# Patient Record
Sex: Female | Born: 1935
Health system: Southern US, Community
[De-identification: ages and names within clinical notes are randomized; demographics above are authoritative.]

## PROBLEM LIST (undated history)

## (undated) DIAGNOSIS — E782 Mixed hyperlipidemia: Secondary | ICD-10-CM

## (undated) DIAGNOSIS — I629 Nontraumatic intracranial hemorrhage, unspecified: Secondary | ICD-10-CM

## (undated) DIAGNOSIS — K219 Gastro-esophageal reflux disease without esophagitis: Secondary | ICD-10-CM

## (undated) DIAGNOSIS — K922 Gastrointestinal hemorrhage, unspecified: Secondary | ICD-10-CM

## (undated) DIAGNOSIS — E039 Hypothyroidism, unspecified: Secondary | ICD-10-CM

## (undated) DIAGNOSIS — K222 Esophageal obstruction: Secondary | ICD-10-CM

## (undated) DIAGNOSIS — I509 Heart failure, unspecified: Secondary | ICD-10-CM

## (undated) DIAGNOSIS — K635 Polyp of colon: Secondary | ICD-10-CM

## (undated) DIAGNOSIS — I2510 Atherosclerotic heart disease of native coronary artery without angina pectoris: Secondary | ICD-10-CM

## (undated) DIAGNOSIS — M1990 Unspecified osteoarthritis, unspecified site: Secondary | ICD-10-CM

## (undated) DIAGNOSIS — I10 Essential (primary) hypertension: Secondary | ICD-10-CM

## (undated) DIAGNOSIS — K449 Diaphragmatic hernia without obstruction or gangrene: Secondary | ICD-10-CM

## (undated) DIAGNOSIS — F0390 Unspecified dementia without behavioral disturbance: Secondary | ICD-10-CM

## (undated) DIAGNOSIS — G629 Polyneuropathy, unspecified: Secondary | ICD-10-CM

## (undated) HISTORY — DX: Polyp of colon: K63.5

## (undated) HISTORY — PX: TOTAL ABDOMINAL HYSTERECTOMY: SHX209

## (undated) HISTORY — DX: Esophageal obstruction: K22.2

## (undated) HISTORY — DX: Nontraumatic intracranial hemorrhage, unspecified: I62.9

## (undated) HISTORY — PX: APPENDECTOMY: SHX54

## (undated) HISTORY — DX: Gastrointestinal hemorrhage, unspecified: K92.2

## (undated) HISTORY — DX: Atherosclerotic heart disease of native coronary artery without angina pectoris: I25.10

## (undated) HISTORY — DX: Mixed hyperlipidemia: E78.2

## (undated) HISTORY — PX: COLON SURGERY: SHX602

## (undated) HISTORY — DX: Unspecified osteoarthritis, unspecified site: M19.90

## (undated) HISTORY — PX: TOTAL ABDOMINAL HYSTERECTOMY W/ BILATERAL SALPINGOOPHORECTOMY: SHX83

## (undated) HISTORY — PX: CATARACT EXTRACTION: SUR2

## (undated) HISTORY — DX: Diaphragmatic hernia without obstruction or gangrene: K44.9

## (undated) HISTORY — DX: Hypothyroidism, unspecified: E03.9

---

## 2005-06-15 ENCOUNTER — Ambulatory Visit: Payer: Self-pay | Admitting: Cardiology

## 2005-06-27 ENCOUNTER — Ambulatory Visit: Payer: Self-pay | Admitting: Internal Medicine

## 2005-07-10 ENCOUNTER — Ambulatory Visit: Payer: Self-pay | Admitting: Internal Medicine

## 2005-07-10 ENCOUNTER — Ambulatory Visit (HOSPITAL_COMMUNITY): Admission: RE | Admit: 2005-07-10 | Discharge: 2005-07-10 | Payer: Self-pay | Admitting: Internal Medicine

## 2005-07-23 ENCOUNTER — Ambulatory Visit: Payer: Self-pay | Admitting: Internal Medicine

## 2005-08-06 ENCOUNTER — Inpatient Hospital Stay (HOSPITAL_BASED_OUTPATIENT_CLINIC_OR_DEPARTMENT_OTHER): Admission: RE | Admit: 2005-08-06 | Discharge: 2005-08-06 | Payer: Self-pay | Admitting: Cardiology

## 2005-08-06 ENCOUNTER — Ambulatory Visit: Payer: Self-pay | Admitting: Cardiology

## 2005-08-23 ENCOUNTER — Ambulatory Visit: Payer: Self-pay | Admitting: Cardiology

## 2005-09-04 ENCOUNTER — Ambulatory Visit: Payer: Self-pay | Admitting: Internal Medicine

## 2006-02-20 ENCOUNTER — Ambulatory Visit: Payer: Self-pay | Admitting: Cardiology

## 2006-03-18 ENCOUNTER — Ambulatory Visit: Payer: Self-pay | Admitting: Cardiology

## 2006-06-10 ENCOUNTER — Ambulatory Visit: Payer: Self-pay | Admitting: Cardiology

## 2006-08-23 ENCOUNTER — Ambulatory Visit: Payer: Self-pay | Admitting: Cardiology

## 2007-03-03 ENCOUNTER — Ambulatory Visit: Payer: Self-pay | Admitting: Cardiology

## 2007-03-11 ENCOUNTER — Ambulatory Visit: Payer: Self-pay | Admitting: Cardiology

## 2007-03-25 ENCOUNTER — Ambulatory Visit: Payer: Self-pay | Admitting: Family Medicine

## 2008-04-26 ENCOUNTER — Ambulatory Visit: Payer: Self-pay | Admitting: Cardiology

## 2008-09-17 ENCOUNTER — Encounter: Payer: Self-pay | Admitting: Cardiology

## 2008-10-26 ENCOUNTER — Encounter: Payer: Self-pay | Admitting: Cardiology

## 2008-10-27 ENCOUNTER — Encounter: Payer: Self-pay | Admitting: Cardiology

## 2008-11-08 ENCOUNTER — Encounter: Payer: Self-pay | Admitting: Cardiology

## 2008-12-08 ENCOUNTER — Encounter: Payer: Self-pay | Admitting: Cardiology

## 2008-12-15 ENCOUNTER — Encounter: Payer: Self-pay | Admitting: Cardiology

## 2009-01-06 ENCOUNTER — Ambulatory Visit: Payer: Self-pay | Admitting: Cardiology

## 2009-06-12 DIAGNOSIS — I2510 Atherosclerotic heart disease of native coronary artery without angina pectoris: Secondary | ICD-10-CM | POA: Insufficient documentation

## 2009-06-12 DIAGNOSIS — E782 Mixed hyperlipidemia: Secondary | ICD-10-CM | POA: Insufficient documentation

## 2009-07-17 ENCOUNTER — Encounter: Payer: Self-pay | Admitting: Cardiology

## 2009-08-01 ENCOUNTER — Ambulatory Visit: Payer: Self-pay | Admitting: Cardiology

## 2009-08-01 DIAGNOSIS — I10 Essential (primary) hypertension: Secondary | ICD-10-CM

## 2009-08-03 ENCOUNTER — Encounter (INDEPENDENT_AMBULATORY_CARE_PROVIDER_SITE_OTHER): Payer: Self-pay | Admitting: *Deleted

## 2009-09-28 ENCOUNTER — Encounter: Payer: Self-pay | Admitting: Cardiology

## 2009-09-29 ENCOUNTER — Encounter (INDEPENDENT_AMBULATORY_CARE_PROVIDER_SITE_OTHER): Payer: Self-pay | Admitting: *Deleted

## 2010-02-21 ENCOUNTER — Ambulatory Visit: Payer: Self-pay | Admitting: Cardiology

## 2010-02-24 ENCOUNTER — Encounter: Payer: Self-pay | Admitting: Cardiology

## 2010-06-14 ENCOUNTER — Encounter: Payer: Self-pay | Admitting: Cardiology

## 2010-06-15 ENCOUNTER — Encounter: Payer: Self-pay | Admitting: Cardiology

## 2010-08-07 ENCOUNTER — Ambulatory Visit: Payer: Self-pay | Admitting: Cardiology

## 2010-08-10 ENCOUNTER — Ambulatory Visit (HOSPITAL_COMMUNITY)
Admission: RE | Admit: 2010-08-10 | Discharge: 2010-08-10 | Payer: Self-pay | Source: Home / Self Care | Attending: Ophthalmology | Admitting: Ophthalmology

## 2010-09-26 NOTE — Assessment & Plan Note (Signed)
Summary: 6 MO FU PER JUNE REMINDER-SRS   Visit Type:  Follow-up Primary Provider:  Dr. Doreen Beam   History of Present Illness: 77 year old woman presents for followup. She reports no problems with angina or unusual breathlessness. Medications are reviewed below. She has not had a recent lipid profile.  Blood pressure checks at home have been more reassuring, although her blood pressure today is elevated. We continue to discuss this. Sodium restriction has been recommended.  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  Preventive Screening-Counseling & Management  Alcohol-Tobacco     Smoking Status: never  Current Medications (verified): 1)  Nitrostat 0.4 Mg Subl (Nitroglycerin) .... Place 1 Tablet Under Tongue As Directed 2)  Zantac 150 Mg Tabs (Ranitidine Hcl) .... Take 2 Tablets in Am and 1 in Pm 3)  Iron 325 (65 Fe) Mg Tabs (Ferrous Sulfate) .... Take 1 Tablet By Mouth Daily 4)  Niacin Cr 500 Mg Cr-Caps (Niacin) .... Take 1 Tablet By Mouth Once A Day 5)  Multivitamins  Tabs (Multiple Vitamin) .... Take 1 Tablet By Mouth Once A Day 6)  Atenolol 25 Mg Tabs (Atenolol) .... Take 1 Tablet By Mouth Two Times A Day 7)  Lorazepam 2 Mg Tabs (Lorazepam) .... Take 1 Tablet By Mouth Once A Day At Bedtime 8)  Aspirin 81 Mg Tbec (Aspirin) .... Take 1 Tablet By Mouth Once A Day 9)  Oscal 500/200 D-3 500-200 Mg-Unit Tabs (Calcium-Vitamin D) .... Take 1 Tablet By Mouth Twice A Day 10)  Furosemide 40 Mg Tabs (Furosemide) .... Take 1 Tablet By Mouth Every Other Day As Needed 11)  Synthroid 112 Mcg Tabs (Levothyroxine Sodium) .... Take 1 Tablet By Mouth Once A Day 12)  Potassium Chloride Cr 10 Meq Cr-Tabs (Potassium Chloride) .... Take 1 Tablet By Mouth Once A Day As Needed 13)  Boswellia Serrata Extract  Powd (Boswellia Serrata Extract) .... 400mg  Take 1 Tablet By Mouth Two Times A Day 14)  Coq10 100 Mg Caps (Coenzyme Q10) .... Take 1 Tablet By  Mouth Once A Day 15)  Tylenol Arthritis Pain 650 Mg Cr-Tabs (Acetaminophen) .... Take 1 Tablet By Mouth Two Times A Day 16)  Magnesium 250 Mg Tabs (Magnesium) .... Take 1 Tablet By Mouth Once A Day 17)  Cinnamon 500 Mg Tabs (Cinnamon) .... Take 2 Tablet By Mouth Two Times A Day 18)  Osteo Bi-Flex Adv Triple St  Tabs (Misc Natural Products) .... Take 1 Tablet By Mouth Two Times A Day  Allergies (verified): 1)  ! Valium 2)  ! * Statins  Comments:  Nurse/Medical Assistant: The patient's medication list and allergies were reviewed with the patient and were updated in the Medication and Allergy Lists.  Past History:  Past Medical History: Last updated: 08/01/2009 Gastrointestinal bleed - possibly related to NSAIDs CAD - nonobstructive at catheterization December 2006 Hyperlipidemia Moderate sized sliding hiatal hernia Distal esophageal ring status post dilatation Colonic polyps Arthritis Hypothyroidism  Social History: Last updated: 02/21/2010 Retired  Widowed  Tobacco Use - No Alcohol Use - no   Social History: Retired  Widowed  Tobacco Use - No Alcohol Use - no  Review of Systems  The patient denies anorexia, fever, chest pain, syncope, dyspnea on exertion, peripheral edema, melena, and hematochezia.         Reports chronic problems with neuropathy. No claudication. Otherwise reviewed and negative.  Vital Signs:  Patient profile:   77 year old female Height:  64 inches Weight:      155 pounds BMI:     26.70 Pulse rate:   87 / minute BP sitting:   163 / 78  (left arm) Cuff size:   regular  Vitals Entered By: Carlye Grippe (February 21, 2010 3:17 PM)  Physical Exam  Additional Exam:  Overweight woman in no acute distress. HEENT: Conjunctiva and lids normal, oropharynx with moist mucosa. Neck: Supple, no bruits, no thyromegaly. Lungs: Her to auscultation, diminished, nonlabored. Cardiac: Regular rate and rhythm, soft systolic murmur at the base, no S3  gallop. Abdomen: Soft, nontender, bowel sounds present. Skin: Warm and dry. Extremities: No pitting edema distal pulses 1-2+. Musculoskeletal: No gross deformities. Neuropsychiatric: Alert oriented x3, affect appropriate.   EKG  Procedure date:  02/21/2010  Findings:      Sinus rhythm at 85 beats per minute with nonspecific ST changes.  Impression & Recommendations:  Problem # 1:  CAD, NATIVE VESSEL (ICD-414.01)  Symptomatically stable with prior documentation of nonobstructive disease at catheterization in 2006. Plan to continue medical therapy and observation at this point.  Her updated medication list for this problem includes:    Nitrostat 0.4 Mg Subl (Nitroglycerin) .[name]... Place 1 tablet under tongue as directed    Atenolol 25 Mg Tabs (Atenolol) .[name]... Take 1 tablet by mouth two times a day    Aspirin 81 Mg Tbec (Aspirin) .[name]... Take 1 tablet by mouth once a day  Orders: EKG w/ Interpretation (93000)  Problem # 2:  HYPERLIPIDEMIA-MIXED (ICD-272.4)  Difficult to optimally managed in light of significant statin intolerance. She is using cinnamon supplements as well as niacin and eating Cheerios. Plan to advance niacin 1000 mg daily, and repeat lipid profile and liver function tests.  Her updated medication list for this problem includes:    Niacin Cr 500 Mg Cr-caps (Niacin) .[name]... Take 2 tablets by mouth daily.  Orders: T-Hepatic Function [phone]) T-Lipid Profile [phone])  Problem # 3:  ESSENTIAL HYPERTENSION, BENIGN (ICD-401.1)  Blood pressure elevated today. We continue to discuss this. Sodium restriction recommended. She seems hesitant to any additional medicines at this point.  Her updated medication list for this problem includes:    Atenolol 25 Mg Tabs (Atenolol) .[name]... Take 1 tablet by mouth two times a day    Aspirin 81 Mg Tbec (Aspirin) .[name]... Take 1 tablet by mouth once a day    Furosemide 40 Mg Tabs (Furosemide) .[name]... Take 1 tablet by mouth every other  day as needed  Patient Instructions: 1)  Take Niacin 500mg  2 tablets daily. 2)  Your physician recommends that you go to the [facility] for a FASTING lipid profile and liver function labs. Do not eat or drink after midnight. If the results of your test are normal or stable, you will receive a letter. If they are abnormal, the nurse will contact you by phone.  3)  Your physician wants you to follow-up in: 6 months. You will receive a reminder letter in the mail one-two months in advance. If you don't receive a letter, please call our office to schedule the follow-up appointment.

## 2010-09-26 NOTE — Progress Notes (Signed)
Summary: BLOOD PRESSURE READINGS  Office Visit/ BLOOD PRESSURE READINGS   Imported By: Dorise Hiss 09/29/2009 10:40:20  _____________________________________________________________________  External Attachment:    Type:   Image     Comment:   External Document  Appended Document: Office Visit/ BLOOD PRESSURE READINGS These look better - no new medication at this point.  Appended Document: Office Visit/ BLOOD PRESSURE READINGS Pt notified of results by letter.

## 2010-09-26 NOTE — Letter (Signed)
Summary: Engineer, materials at [facility]  [address]   [place], [place] [address]   Phone: [phone]  Fax: [phone]        September 29, 2009 MRN: [id number]    Lisa Kemp [id number] [facility] [address] [place], [place]  [address]    Dear Lisa Kemp,  Your test ordered by Selena Batten has been reviewed by your physician (or physician assistant) and was found to be normal or stable. Your physician (or physician assistant) felt no changes were needed at this time.  ____ Echocardiogram  ____ Cardiac Stress Test  ____ Lab Work  ____ Peripheral vascular study of arms, legs or neck  ____ CT scan or X-ray  ____ Lung or Breathing test  _X___ Other: Blood pressure readings. Per Dr. Diona Browner: Blood pressures look better, no new medication needed at this point.   Thank you.   Cyril Loosen, RN, BSN    Duane Boston, M.D., F.A.C.C. Thressa Sheller, M.D., F.A.C.C. Oneal Grout, M.D., F.A.C.C. Cheree Ditto, M.D., F.A.C.C. Daiva Nakayama, M.D., F.A.C.C. Kenney Houseman, M.D., F.A.C.C. Jeanne Ivan, PA-C

## 2010-09-28 NOTE — Assessment & Plan Note (Signed)
Summary: 6 MO FUL   Visit Type:  Follow-up Primary Chrislynn Mosely:  Dr. Doreen Beam   History of Present Illness: 77 year old woman presents for followup. She was seen back in June. She has no angina this time and reports no change in her breathing status. She is going to undergo cataract surgery later this week. She continues to work part-time.  Followup labs from July showed AST 16, ALT 14, cholesterol 192, triglycerides 179, HDL 35, LDL 121. She has had a significant statin intolerance. Medical regimen noted below which also includes cinnamon supplements.  Preventive Screening-Counseling & Management  Alcohol-Tobacco     Smoking Status: never  Current Medications (verified): 1)  Nitrostat 0.4 Mg Subl (Nitroglycerin) .... Place 1 Tablet Under Tongue As Directed 2)  Zantac 150 Mg Tabs (Ranitidine Hcl) .... Take 2 Tablets in Am and 1 in Pm 3)  Iron 325 (65 Fe) Mg Tabs (Ferrous Sulfate) .... Take 1 Tablet By Mouth Daily 4)  Niacin Cr 500 Mg Cr-Caps (Niacin) .... Take 1 Tablet By Mouth Two Times A Day 5)  Multivitamins  Tabs (Multiple Vitamin) .... Take 1 Tablet By Mouth Once A Day 6)  Atenolol 25 Mg Tabs (Atenolol) .... Take 1 Tablet By Mouth Two Times A Day 7)  Lorazepam 2 Mg Tabs (Lorazepam) .... Take 1 Tablet By Mouth Once A Day At Bedtime 8)  Aspirin 81 Mg Tbec (Aspirin) .... Take 1 Tablet By Mouth Once A Day 9)  Oscal 500/200 D-3 500-200 Mg-Unit Tabs (Calcium-Vitamin D) .... Take 1 Tablet By Mouth Twice A Day 10)  Furosemide 40 Mg Tabs (Furosemide) .... Take 1 Tablet By Mouth Daily As Needed 11)  Synthroid 112 Mcg Tabs (Levothyroxine Sodium) .... Take 1 Tablet By Mouth Once A Day 12)  Potassium Chloride Cr 10 Meq Cr-Tabs (Potassium Chloride) .... Take 1 Tablet By Mouth Once A Day As Needed 13)  Coq10 200 Mg Caps (Coenzyme Q10) .... Take 1 Tablet By Mouth Once A Day 14)  Magnesium 250 Mg Tabs (Magnesium) .... Take 1 Tablet By Mouth Once A Day 15)  Aleve 220 Mg Tabs (Naproxen Sodium) ....  Take 1 Tablet By Mouth Two Times A Day  Allergies (verified): 1)  ! Valium 2)  ! * Statins  Comments:  Nurse/Medical Assistant: The patient's medication list and allergies were reviewed with the patient and were updated in the Medication and Allergy Lists.  Past History:  Past Medical History: Last updated: 08/01/2009 Gastrointestinal bleed - possibly related to NSAIDs CAD - nonobstructive at catheterization December 2006 Hyperlipidemia Moderate sized sliding hiatal hernia Distal esophageal ring status post dilatation Colonic polyps Arthritis Hypothyroidism  Social History: Last updated: 02/21/2010 Retired  Widowed  Tobacco Use - No Alcohol Use - no  Review of Systems  The patient denies anorexia, fever, chest pain, syncope, peripheral edema, prolonged cough, melena, and hematochezia.         Otherwise reviewed and negative except as outlined.  Vital Signs:  Patient profile:   77 year old female Height:      64 inches Weight:      155 pounds Pulse rate:   84 / minute BP sitting:   133 / 75  (left arm) Cuff size:   regular  Vitals Entered By: Carlye Grippe (August 07, 2010 4:03 PM)  Physical Exam  Additional Exam:  Overweight woman in no acute distress. HEENT: Conjunctiva and lids normal, oropharynx with moist mucosa. Neck: Supple, no bruits, no thyromegaly. Lungs: Her to auscultation, diminished, nonlabored. Cardiac:  Regular rate and rhythm, soft systolic murmur at the base, no S3 gallop. Abdomen: Soft, nontender, bowel sounds present. Skin: Warm and dry. Extremities: No pitting edema distal pulses 1-2+.   Impression & Recommendations:  Problem # 1:  CAD, NATIVE VESSEL (ICD-414.01)  Symptomatically stable, no active angina, with prior history of nonobstructive disease in 2006. Continue medical therapy. Followup in 6 months.  Her updated medication list for this problem includes:    Nitrostat 0.4 Mg Subl (Nitroglycerin) .[name]... Place 1 tablet under  tongue as directed    Atenolol 25 Mg Tabs (Atenolol) .[name]... Take 1 tablet by mouth two times a day    Aspirin 81 Mg Tbec (Aspirin) .[name]... Take 1 tablet by mouth once a day  Problem # 2:  ESSENTIAL HYPERTENSION, BENIGN (ICD-401.1)  Blood pressure up some today. Discussed sodium restriction, continue present medications.  Her updated medication list for this problem includes:    Atenolol 25 Mg Tabs (Atenolol) .[name]... Take 1 tablet by mouth two times a day    Aspirin 81 Mg Tbec (Aspirin) .[name]... Take 1 tablet by mouth once a day    Furosemide 40 Mg Tabs (Furosemide) .[name]... Take 1 tablet by mouth daily as needed  Problem # 3:  HYPERLIPIDEMIA-MIXED (ICD-272.4)  Patient with significant statin intolerance. Continue present course including diet.  Her updated medication list for this problem includes:    Niacin Cr 500 Mg Cr-caps (Niacin) .[name]... Take 1 tablet by mouth two times a day  Patient Instructions: 1)  Your physician wants you to follow-up in: 6 months. You will receive a reminder letter in the mail one-two months in advance. If you don't receive a letter, please call our office to schedule the follow-up appointment. 2)  Your physician recommends that you continue on your current medications as directed. Please refer to the Current Medication list given to you today.

## 2010-09-28 NOTE — Letter (Signed)
Summary: Discharge [facility]  Discharge [facility]   Imported By: Dorise Hiss 08/07/2010 15:58:59  _____________________________________________________________________  External Attachment:    Type:   Image     Comment:   External Document

## 2010-11-07 LAB — BASIC METABOLIC PANEL
BUN: 25 mg/dL — ABNORMAL HIGH (ref 6–23)
CO2: 30 mEq/L (ref 19–32)
Calcium: 8.6 mg/dL (ref 8.4–10.5)
Chloride: 103 mEq/L (ref 96–112)
Creatinine, Ser: 1.28 mg/dL — ABNORMAL HIGH (ref 0.4–1.2)
Glucose, Bld: 99 mg/dL (ref 70–99)

## 2010-11-07 LAB — HEMOGLOBIN AND HEMATOCRIT, BLOOD: HCT: 28.6 % — ABNORMAL LOW (ref 36.0–46.0)

## 2011-01-09 NOTE — Assessment & Plan Note (Signed)
[facility]                          [facility] OFFICE NOTE   Lisa Kemp, FRAME                  MRN:          [id number]  DATE:01/06/2009                            DOB:          1923-12-18    PRIMARY CARE PHYSICIAN:  Doreen Beam, MD   REASON FOR VISIT:  Routine followup.   HISTORY OF PRESENT ILLNESS:  Lisa Kemp comes back in for regular  visit.  Since I last saw her, she was diagnosed with fairly severe  anemia back in March of this year at which time her hemoglobin had  gotten down around 7.  She was referred to Dr. Karilyn Cota and received  packed red cells with subsequent endoscopy and colonoscopy.  She was  noted to have a noncritical distal esophageal ring which was dilated,  moderate-sized sliding hiatal hernia, no significant peptic ulcer  disease, few scattered diverticula in the colon, a small colonic polyp  in the cecum, and it was felt that she may have had GI blood loss  related to chronic nonsteroidal use.  She has been on supplemental iron  and is actually due for followup labs tomorrow.  She states that her  hemoglobin improved significantly and she feels much better.  She denies  having any problems with angina or palpitations.  Lipids from January of  this year showed an LDL of 106 (statin intolerance) and an HDL of 30.   ALLERGIES:  No known drug allergies.   PRESENT MEDICATIONS:  1. Zantac 300 mg p.o. q.a.m. 150 mg p.o. q.p.m.  2. Iron 65 mg p.o. b.i.d.  3. Cinnamon 500 mg p.o. t.i.d.  4. Niacin 500 mg p.o. t.i.d.  5. Multivitamin 1 p.o. q.a.m.  6. Atenolol 25 mg p.o. b.i.d.  7. Lorazepam 2 mg p.o. nightly.  8. Glucosamine 500 mg p.o. t.i.d.  9. Aspirin 81 mg p.o. daily.  10.Os-Cal.  11.Vitamin D 500 mg p.o. daily.  12.Lasix 20-40 mg p.o. p.r.n.  13.Levoxyl 112 mcg p.o. daily.  14.Klor-Con 10 mEq p.o. daily p.r.n. with Lasix.  15.Sublingual nitroglycerin 0.4 mg p.r.n.   REVIEW OF SYSTEMS:  As outlined  above.  Otherwise, reviewed negative.   PHYSICAL EXAMINATION:  VITAL SIGNS:  Blood pressure is 138/72, heart  rate is 78, weight is 160 pounds which is up 6 pounds from her last  visit.  GENERAL:  The patient is comfortable in no acute distress.  HEENT:  Conjunctiva is normal.  Oropharynx clear.  NECK:  Supple.  LUNGS:  Clear.  Nonlabored breathing.  CARDIAC:  Regular rate and rhythm.  No S3 gallop or pericardial rub.  EXTREMITIES:  Exhibit no significant pitting edema.   IMPRESSION AND RECOMMENDATIONS:  1. Nonobstructive coronary artery disease with normal ejection      fraction based on previous evaluation.  [name] is not      describing any problems with angina, since her last visit.  Her      medical therapy is reasonable and she is not having to use any      sublingual nitroglycerin of late.  We talked about a basic walking  regimen, and I will plan to see her back for symptom observation      over the next 6 months.  2. Hyperlipidemia with statin intolerance.  She is taking omega-3      supplements and niacin as well as cinnamon supplements.  Her LDL      was around 100.  3. Apparent gastrointestinal bleed related to nonsteroidal anti-      inflammatory drug use.  The patient is being followed by Dr.      Karilyn Cota.  She had a colonoscopy and endoscopy in March of this year.      She is due for followup labs soon and states that she feels much      better after packed red cell transfusion.     Jonelle Sidle, MD  Electronically Signed    SGM/MedQ  DD: 01/06/2009  DT: 01/07/2009  Job #: [id number]   cc:   Doreen Beam, MD

## 2011-01-09 NOTE — Assessment & Plan Note (Signed)
[facility]                          [facility]ICE NOTE   ATHIRA, JANOWICZ                  MRN:          [id number]  DATE:03/03/2007                            DOB:          07-Jun-1924    PRIMARY CARE PHYSICIAN:  Dr. Doreen Beam.   REASON FOR VISIT:  Coronary artery disease.   HISTORY OF PRESENT ILLNESS:  Ms. Manrique was in the office back in  December.  She continues to do relatively well with occasional chest  tightness, although nothing progressive.  She has known coronary  disease with a 70% distal right coronary artery stenosis that has been  managed medically.  She is tolerating her medicines well, although I  note that she was taken off of simvastatin, given problems with  myalgias.  She was placed on pravastatin by Shelby Dubin and is due to  have follow-up lipids and liver function tests in August.   ALLERGIES:  No known drug allergies.   CURRENT MEDICATIONS:  1. Nexium 40 mg p.o. daily.  2. Klor-Con 10 mEq p.o. daily.  3. Levoxyl 112 mcg p.o. daily.  4. Lasix 20 mg p.o. daily, occasionally 40 mg every third day.  5. Aspirin 81 mg p.o. daily.  6. Glucosamine 500 mg p.o. t.i.d.  7. CoQ 10 daily.  8. Zantac 150 mg p.o. daily.  9. Lorazepam 2 mg p.o. nightly.  10.Pravastatin 40 mg p.o. nightly.  11.Atenolol 25 mg p.o. b.i.d.   REVIEW OF SYSTEMS:  Per history of present illness, otherwise negative.   PHYSICAL EXAMINATION:  VITAL SIGNS:  Blood pressure 110/64, heart rate  80.  Weight is 162 pounds.  GENERAL:  Patient is comfortable in no acute distress.  NECK:  No elevated jugular venous distention, no loud bruits.  LUNGS:  Generally clear without labored breathing.  CARDIAC:  Regular rate and rhythm.  No loud murmur or gallop.  EXTREMITIES:  No significant pitting edema.   A 12-lead electrocardiogram today shows a sinus rhythm with nonspecific  ST-T wave changes.   IMPRESSION/RECOMMENDATIONS:  1. Coronary  artery disease with a 70% distal right coronary stenosis      and otherwise nonobstructive disease and normal ejection fraction,      being managed medically.  Intermittent angina is stable.  Will plan      to continue present medications.  I gave her a refill for atenolol.      She is also due to have follow-up liver functions and lipids in      August, having been switched from simvastatin to pravastatin.  Her      previous myalgias have resolved.  2. Further followup in the next six months.     Jonelle Sidle, MD  Electronically Signed    SGM/MedQ  DD: 03/03/2007  DT: 03/04/2007  Job #: [id number]   cc:   Doreen Beam

## 2011-01-09 NOTE — Assessment & Plan Note (Signed)
[facility]                          [facility] OFFICE NOTE   AISLEY, WHAN                  MRN:          [id number]  DATE:04/26/2008                            DOB:          Nov 16, 1923    PRIMARY CARE PHYSICIAN:  Doreen Beam, MD   REASON FOR VISIT:  Scheduled followup.   HISTORY OF PRESENT ILLNESS:  Ms. Wallman was last seen in the office  approximately 1 year ago.  She has a history of previously documented  nonobstructive coronary atherosclerosis with the exception of a 70%  stenosis just prior to the takeoff of the posterior descending branch.  Followup noninvasive testing demonstrates overall low-risk findings in  terms of perfusion defects and she has been relatively stable with  occasional anginal symptoms.  Ejection fraction is 61%.  She has had  trouble with statin intolerances including now Vytorin, Zocor, and  Pravachol.  She developed myalgias with all of these preparations and is  now only taking niacin daily.  Fortunately, her last lipid profile in  February of this year looked overall fairly good with an LDL of 85 and  normal liver function tests.  She reports no problems of significant  palpitations.  She seems to have better control of her symptoms on  atenolol twice daily instead of once daily.  Her electrocardiogram today  is relatively stable showing sinus rhythm with decreased R-wave  progression and nonspecific ST-T wave changes.   ALLERGIES:  No known drug allergies.   PRESENT MEDICATIONS:  1. Niacin 500 mg p.o. daily.  2. Klor-Con 10 mEq p.o. daily.  3. Levoxyl 112 mcg p.o. daily.  4. Lasix 20 mg p.o. daily.  5. Aspirin 81 mg p.o. daily.  6. Glucosamine 500 mg p.o. t.i.d.  7. Zantac 150 mg p.o. nightly p.r.n.  8. CoQ10 daily.  9. Lorazepam as directed.  10.Atenolol 25 mg presently daily, although was typically been b.i.d.   REVIEW OF SYSTEMS:  As per history of present illness.  Otherwise,  negative.   PHYSICAL EXAMINATION:  VITAL SIGNS:  Blood pressure 139/80, heart rate  is 90, and weight is 154 pounds.  GENERAL:  This is a pleasant lively elderly woman without acute  complaints.  HEENT:  Conjunctiva is normal.  Oropharynx is clear.  NECK:  Supple.  No elevated jugular venous pressure.  No audible bruits.  No thyromegaly is noted.  LUNGS:  Clear without labored breathing at rest.  Diminished breath  sounds noted.  CARDIAC:  Regular rate and rhythm.  No S3, gallop, or pericardial rub.  ABDOMEN:  Soft and nontender.  EXTREMITIES:  Exhibit no frank pitting edema.  SKIN:  Warm and dry.  MUSCULOSKELETAL:  No kyphosis noted.  NEUROPSYCHIATRIC:  The patient is alert and oriented x3.  Affect is  normal.   IMPRESSION AND RECOMMENDATIONS:  1. Nonobstructive coronary atherosclerosis with normal ejection      fraction.  Ms. Andy has occasional chest pain, which is likely      anginal, although it has not been progressive.  I will plan to have      her increase her atenolol back  to 25 mg p.o. b.i.d., and otherwise,      continue her remaining medications.  She is not on any long-acting      nitrates, which could be considered ultimately if needed.  I will      plan to see her back over the next 6 months.  2. Hyperlipidemia.  She is presently on niacin alone with a general      intolerance to statins.  Last lipid profile was in February.  We      will plan to repeat this.     Jonelle Sidle, MD  Electronically Signed    SGM/MedQ  DD: 04/26/2008  DT: 04/27/2008  Job #: [id number]
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
## 2011-01-12 NOTE — Cardiovascular Report (Signed)
NAMEKENDALLYN, LIPPOLD NO.:  [id number]   MEDICAL RECORD NO.:  [id number]          PATIENT TYPE:  OIB   LOCATION:  1963                         FACILITY:  [facility]   PHYSICIAN:  Rollene Rotunda, M.D.   DATE OF BIRTH:  June 09, 1924   DATE OF PROCEDURE:  08/06/2005  DATE OF DISCHARGE:  08/06/2005                              CARDIAC CATHETERIZATION   PRIMARY CARE PHYSICIAN:  Wende Crease, MD.   CARDIOLOGIST:  Learta Codding, MD, [facility].   PROCEDURE:  Left heart catheterization and selective coronary arteriography.   INDICATION:  Evaluate patient with chest pain and a Cardiolite demonstrating  a fixed possible inferior wall defect.  There was possible transient global  dilatation.  Overall, the ejection fraction was 70%.   PROCEDURAL NOTE:  Left heart catheterization was performed via the right  femoral artery.  The artery was cannulated using an anterior wall puncture.  A #4-French sheath was inserted via the modified Seldinger technique.  Ultimately, this had to be switched to #5-French sheaths to find the right  catheter to cannulate the left main.  She needed a JL-5 [place] catheter to  cannulate the left main.  The patient tolerated the procedure well and left  the lab in stable condition.   RESULTS:   HEMODYNAMICS:  LV 159/11, LV 159/71.   CORONARIES:  The left main had a distal, calcified plaque with no luminal  obstruction.  The LAD had a long, proximal, 25% stenosis with calcification.  The first diagonal was small and normal.  The second diagonal was moderate,  bifurcating, and normal.  The circumflex was a large vessel in the AV  groove.  There were luminal irregularities.  The first obtuse marginal was  large and normal.  The second obtuse marginal was large and normal.  The  right coronary artery was a large, dominant vessel.  There was 70% stenosis  before the PDA.  The PDA was moderate sized and normal.  The posterolateral  was small and normal.  The  right coronary artery had scattered  calcification.   LEFT VENTRICULOGRAM:  The left ventriculogram was obtained in the RAO  projection.  The EF was 65% with normal wall motion.   CONCLUSION:  Right coronary artery stenosis.  This does not appear to be  obstructive by the results of the Cardiolite, which demonstrated no  reversible ischemia in the inferior wall.  The patient's symptoms are  atypical.  There are no high-grade lesions in the left system to explain the transient  dilatation in the LV on the Cardiolite.  Given all of this, the patient will  be managed medically with aggressive risk reduction.  Consideration can be  given to PCI of the right coronary artery if she has ischemic symptoms in  the future.           ______________________________  Rollene Rotunda, M.D.     JH/MEDQ  D:  08/06/2005  T:  08/06/2005  Job:  [id number]   cc:   Wende Crease, M.D.

## 2011-01-12 NOTE — Assessment & Plan Note (Signed)
[facility]                          [facility]ICE NOTE   MAKENZYE, TROUTMAN                  MRN:          [id number]  DATE:08/23/2006                            DOB:          March 17, 1924    REASON FOR OFFICE VISIT:  Six month followup.   [name] presents for scheduled clinic followup.  Since last seen  here in June by Dr. Simona Huh, she reports no significant change from  her baseline level of exercise tolerance, and continues to have some  occasional, mild anterior chest tightness when walking up a hill.  However, she points out that this is a long standing symptom with no  recent exacerbation.  She denies any symptoms at rest.   Patient continues to remain quite active and works every other week.  However, she does tell me that her income is quite limited, and that  this puts constraints on her ability to afford medications.   CURRENT MEDICATIONS:  1. Atenolol 25 daily.  2. Nexium.  3. Levoxyl 0.112 mg daily.  4. Lasix 20 mg p.r.n.  5. Aspirin 81 daily.  6. Glucosamine 500 t.i.d.  7. Zantac 150 nightly.  8. Lorazepam 2 mg nightly.  9. Vytorin 10/40 one-half tablet nightly.   PHYSICAL EXAMINATION:  Blood pressure 126/66, pulse 94 and regular,  weight 157.  GENERAL:  A 77 year old female, sitting upright, in no distress.  NECK:  Palpable bilateral carotid pulse without bruits.  LUNGS:  Clear to auscultation in all fields.  HEART:  Regular rate and rhythm (S1, S2).  No significant murmurs.  EXTREMITIES:  Palpable with distal pulses without edema.  NEURO:  No focal deficit.   REVIEW OF SYSTEMS:  Mild exertional dyspnea but no orthopnea, PND or  lower extremity edema.  Remaining systems negative.   IMPRESSION:  1. Single vessel coronary artery disease      a.     A 70% distal RCA stenosis, treated medically.      b.     Normal left ventricular function.  2. Hyperlipidemia, excellent control with most recent LDL  level 55 on      Vytorin.  3. History of hypertension, well-controlled.  4. Hypothyroidism.  5. Chronic mild lower extremity edema.  6. History of esophageal stricture, status post dilatation, November      2006.   PLAN:  1. Substitute Vytorin with simvastatin 40 mg nightly secondary to      significant financial constraints.  We will need a followup fasting      lipids/liver profile in 12 weeks.  2. Increase atenolol to 25 mg b.i.d. to try to ameliorate her      occasional breakthrough angina with exertion.  We considered adding      Imdur, but given its increased costs, we prefer to try to adjust      her beta blocker dose at this time.  3. Return clinic, follow up with Dr. Simona Huh in 6 months for      continued close monitoring and management.      Rozell Searing, PA-C  Electronically Signed      Remi Deter  Franky Macho, MD  Electronically Signed   GS/MedQ  DD: 08/23/2006  DT: 08/23/2006  Job #: [id number]   cc:   Doreen Beam

## 2011-01-12 NOTE — Assessment & Plan Note (Signed)
[facility]                          [facility]ICE NOTE   RICKETTA, COLANTONIO                    MRN:          [id number]  DATE:02/14/2007                            DOB:          1923/10/03    Telephone documentation note   Ms. Kram was called today regarding her difficultly in tolerating  her antihyperlipidemic therapy. In a telephone conversation today she  relates that several weeks ago she discontinued her simvastatin 40 mg  daily as prescribed by Gene Serpe, PA due to significant muscle ache  myalgia which limited her daily activities. The patient discontinued  that therapy at the time of her calling to the office on or around June  4th. She states that she is feeling better now. She continues on her  coenzyme Q10. She has no known drug allergies. She states she has  returned almost to her baseline with some underlying arthritis pain but  nothing other that is focal. Patient states that she is willing to try  other therapies to control her hypercholesterolemia. In review of the  patient's chart and her risk factors, I have recommended that she try  pravastatin 40 mg 1 tablet daily in the evening. She is willing to try  this. I have discussed with her the possibility that this therapy may  not cause as much muscle ache due to its water solubility versus muscle  solubility though this point may not affect clinical endpoints.  The  patient states she will continue taking her coenzyme Q10. She will  monitor for muscle aches and pains that have changed from her baseline.  She requests that the prescription be called to [facility] in [place]. I will  do that today with a quantity of 30 tablets with 1 refill. At the  patient's request I have mailed a prescription to her for a 90 day  supply of pravastatin 40 mg and completed paperwork for her to have labs  completed at the [facility] on or around the first of August to  assess her  progress towards goal. The patient has our contact numbers  with questions or problems in the meantime. She verbalizes understanding  of this plan and will follow up accordingly as indicated.      Shelby Dubin, PharmD, BCPS, CPP  Electronically Signed      Luis Abed, MD, [facility]  Electronically Signed   MP/MedQ  DD: 02/14/2007  DT: 02/14/2007  Job #: [id number]   cc:   Jonelle Sidle, MD
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
## 2011-01-12 NOTE — Op Note (Signed)
[name], [name]           ACCOUNT NO.:  [id number]   MEDICAL RECORD NO.:  [id number]          PATIENT TYPE:  AMB   LOCATION:  DAY                           FACILITY:  [facility]   PHYSICIAN:  Lionel December, M.D.    DATE OF BIRTH:  1924-04-01   DATE OF PROCEDURE:  07/10/2005  DATE OF DISCHARGE:                                 OPERATIVE REPORT   PROCEDURE:  Esophagogastroduodenoscopy with esophageal dilation.   INDICATIONS:  Lisa Kemp is a 77 year old Caucasian female with two-year  history of heartburn and regurgitation who is also experiencing dysphagia.  She failed H2B and initially was not doing well on Nexium. However, she is  taking the medicine as directed and feels better as far as her heartburn is  concerned but not 100%. She is undergoing diagnostic/therapeutic EGD.  Procedure risks were reviewed with the patient, and informed consent was  obtained.   PREMEDICATION:  Cetacaine spray pharyngeal topical anesthesia, Demerol 50 mg  IV, Versed 7 mg IV in divided dose.   FINDINGS:  Procedure performed in endoscopy suite. The patient's vital signs  and O2 saturation were monitored during the procedure and remained stable.  The patient was placed in left lateral position. Olympus videoscope was  passed via oropharynx without any difficulty into esophagus.   Esophagus. Mucosa of the esophagus was normal, but body was tortuous. Ring  was noted at GE junction which was at 33 cm from the incisors. Hiatus was at  37 and was wide open. Small to moderate size hiatal hernia was noted.   Stomach. It was empty and distended very well insufflation. Folds of  proximal stomach were normal. Examination of mucosa at body, antrum, pyloric  channel as well as angularis, fundus and cardia was normal. Hernia was  easily seen on the view.   Duodenum. Bulbar mucosa was normal. Scope was passed into the second part of  the duodenum where mucosa and folds were normal. Endoscope was withdrawn.   Esophagus was dilated by passing 54-French Maloney dilator through esophagus  completely. As the dilator was withdrawn, endoscope was passed again. There  was a tiny tear at cervical esophagus, but ring was still intact and  therefore disrupted with focal biopsy but no tissue was saved. Endoscope was  withdrawn. The patient tolerated the procedure well.   FINAL DIAGNOSIS:  Schatzki's ring with small to moderate size sliding hiatal  hernia. Otherwise normal EGD.   Esophagus dilated by passing 54-French Maloney dilator but ring was still  intact. Therefore disrupted with focal biopsy.   RECOMMENDATIONS:  She will continue anti-reflux measures and Nexium at 40 mg  p.o. q.a.m. She will return for OV in two months to make sure symptoms are  well-controlled. She is allowed to use OTC Zantac or Pepcid for breakthrough  symptoms but will keep the record. She will return for OV in two months from  now.      Lionel December, M.D.  Electronically Signed     NR/MEDQ  D:  07/10/2005  T:  07/10/2005  Job:  [id number]   cc:   Doyne Keel, M.D.

## 2011-02-02 ENCOUNTER — Encounter: Payer: Self-pay | Admitting: Cardiology

## 2011-02-05 ENCOUNTER — Ambulatory Visit (INDEPENDENT_AMBULATORY_CARE_PROVIDER_SITE_OTHER): Payer: Medicare Other | Admitting: Cardiology

## 2011-02-05 ENCOUNTER — Encounter: Payer: Self-pay | Admitting: Cardiology

## 2011-02-05 VITALS — BP 129/72 | HR 81 | Ht 64.0 in | Wt 154.0 lb

## 2011-02-05 DIAGNOSIS — I10 Essential (primary) hypertension: Secondary | ICD-10-CM

## 2011-02-05 DIAGNOSIS — E785 Hyperlipidemia, unspecified: Secondary | ICD-10-CM

## 2011-02-05 DIAGNOSIS — I2510 Atherosclerotic heart disease of native coronary artery without angina pectoris: Secondary | ICD-10-CM

## 2011-02-05 MED ORDER — NITROGLYCERIN 0.4 MG SL SUBL: 0.4000 mg | SUBLINGUAL_TABLET | SUBLINGUAL | Status: DC

## 2011-02-05 NOTE — Assessment & Plan Note (Signed)
Blood pressure control seems to be better. Continue to focus on sodium restriction, continued medical therapy.

## 2011-02-05 NOTE — Assessment & Plan Note (Signed)
Patient to try niacin after using aspirin 30 minutes before to see if his helps with flushing. She has significant statin intolerance.

## 2011-02-05 NOTE — Progress Notes (Signed)
Clinical Summary Lisa Kemp is a 77 y.o.female presenting for followup. She was seen in December 2011. She reports no progressive chest pain symptoms, although does have reflux. She indicates compliance with her medications. Does state that she stopped taking niacin related to flushing. We talked about using an aspirin 30 minutes before niacin to see if this would help however. Continues to have lab work and follow up with Dr. Sherril Croon.  ECG is reviewed below, overall stable.  No reported palpitations or syncope. Blood pressure seems to be better controlled.   Allergies  Allergen Reactions  . Diazepam   . Statins     Current outpatient prescriptions:aspirin 81 MG tablet, Take 81 mg by mouth daily.  , Disp: , Rfl: ;  atenolol (TENORMIN) 25 MG tablet, Take 25 mg by mouth 2 (two) times daily.  , Disp: , Rfl: ;  calcium-vitamin D (OSCAL WITH D) 500-200 MG-UNIT per tablet, Take 1 tablet by mouth 2 (two) times daily.  , Disp: , Rfl: ;  Coenzyme Q10 (COQ-10) 200 MG CAPS, Take 1 capsule by mouth daily.  , Disp: , Rfl:  Ferrous Sulfate (IRON) 325 (65 FE) MG TABS, Take 1 tablet by mouth daily.  , Disp: , Rfl: ;  furosemide (LASIX) 40 MG tablet, Take 40 mg by mouth daily as needed.  , Disp: , Rfl: ;  levothyroxine (SYNTHROID, LEVOTHROID) 112 MCG tablet, Take 112 mcg by mouth daily.  , Disp: , Rfl: ;  LORazepam (ATIVAN) 2 MG tablet, Take 2 mg by mouth at bedtime.  , Disp: , Rfl: ;  Magnesium 250 MG TABS, Take 1 tablet by mouth daily.  , Disp: , Rfl:  Multiple Vitamin (MULTIVITAMIN) capsule, Take 1 capsule by mouth daily.  , Disp: , Rfl: ;  naproxen sodium (ANAPROX) 220 MG tablet, Take 220 mg by mouth 2 (two) times daily.  , Disp: , Rfl: ;  nitroGLYCERIN (NITROSTAT) 0.4 MG SL tablet, Place 0.4 mg under the tongue as directed.  , Disp: , Rfl: ;  potassium chloride (K-DUR) 10 MEQ tablet, Take 10 mEq by mouth daily as needed.  , Disp: , Rfl:  ranitidine (ZANTAC) 150 MG capsule, Take 2 tablets in the morning and 1 in  the evening , Disp: , Rfl: ;  DISCONTD: co-enzyme Q-10 30 MG capsule, Take 30 mg by mouth daily.  , Disp: , Rfl: ;  DISCONTD: niacin 500 MG CR capsule, Take 500 mg by mouth 2 (two) times daily.  , Disp: , Rfl:   Past Medical History  Diagnosis Date  . Mixed hyperlipidemia   . Gastrointestinal bleed     Related to NSAIDs  . Coronary atherosclerosis of native coronary artery     Nonobstructive at catherization 2006  . Hiatal hernia     Moderate sized sliding hiatal hernia  . Esophageal ring     Distal esophageal ring status post dilation  . Colon polyps   . Arthritis   . Hypothyroidism     Past Surgical History  Procedure Date  . Appendectomy   . Cataract extraction   . Total abdominal hysterectomy w/ bilateral salpingoophorectomy   . Total abdominal hysterectomy     Family History  Problem Relation Age of Onset  . Hypertension      Social History Lisa Kemp reports that she has never smoked. She has never used smokeless tobacco. Lisa Kemp reports that she does not drink alcohol.  Review of Systems Complains of problems with leg and foot pain related to "  neuropathy." Chronic lower extremity swelling, intermittently noted. Otherwise negative.  Physical Examination Filed Vitals:   02/05/11 1309  BP: 129/72  Pulse: 81   Additional Exam: Overweight woman in no acute distress.  HEENT: Conjunctiva and lids normal, oropharynx with moist mucosa.  Neck: Supple, no bruits, no thyromegaly.  Lungs: Her to auscultation, diminished, nonlabored.  Cardiac: Regular rate and rhythm, soft systolic murmur at the base, no S3 gallop.  Abdomen: Soft, nontender, bowel sounds present.  Skin: Warm and dry.  Extremities: No pitting edema distal pulses 1-2+.   ECG Normal sinus rhythm with nonspecific ST/T changes.   Problem List and Plan

## 2011-02-05 NOTE — Assessment & Plan Note (Signed)
Symptomatically stable on present medical regimen. Refill given for fresh bottle of nitroglycerin. She has not required any. No other changes made.

## 2011-02-05 NOTE — Patient Instructions (Signed)
Your physician wants you to follow-up in: 6 months. You will receive a reminder letter in the mail one-two months in advance. If you don't receive a letter, please call our office to schedule the follow-up appointment. Your physician recommends that you continue on your current medications as directed. Please refer to the Current Medication list given to you today. 

## 2011-05-15 DIAGNOSIS — R079 Chest pain, unspecified: Secondary | ICD-10-CM

## 2011-07-18 DIAGNOSIS — R079 Chest pain, unspecified: Secondary | ICD-10-CM

## 2011-09-06 ENCOUNTER — Encounter: Payer: Self-pay | Admitting: Cardiology

## 2011-09-06 ENCOUNTER — Ambulatory Visit (INDEPENDENT_AMBULATORY_CARE_PROVIDER_SITE_OTHER): Payer: Medicare Other | Admitting: Cardiology

## 2011-09-06 VITALS — BP 154/70 | HR 79 | Ht 64.0 in | Wt 153.0 lb

## 2011-09-06 DIAGNOSIS — I10 Essential (primary) hypertension: Secondary | ICD-10-CM

## 2011-09-06 DIAGNOSIS — I2510 Atherosclerotic heart disease of native coronary artery without angina pectoris: Secondary | ICD-10-CM

## 2011-09-06 DIAGNOSIS — E785 Hyperlipidemia, unspecified: Secondary | ICD-10-CM
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
## 2011-09-06 NOTE — Assessment & Plan Note (Signed)
She has stopped niacin. She is on CoQ10. Not interested in other statin preparations. Continue followup with Dr. Sherril Croon.

## 2011-09-06 NOTE — Assessment & Plan Note (Signed)
Symptomatically stable on medical therapy. Followup Cardiolite in September was reassuring without active ischemia. Continue observation for now.

## 2011-09-06 NOTE — Assessment & Plan Note (Signed)
Blood pressure elevated some today. We discussed her medications, also diet and sodium restriction. Continue followup with Dr. Sherril Croon.

## 2011-09-06 NOTE — Patient Instructions (Signed)
Your physician wants you to follow-up in: 6 months. You will receive a reminder letter in the mail one-two months in advance. If you don't receive a letter, please call our office to schedule the follow-up appointment. Your physician recommends that you continue on your current medications as directed. Please refer to the Current Medication list given to you today. 

## 2011-11-12 ENCOUNTER — Other Ambulatory Visit: Payer: Self-pay | Admitting: Cardiology

## 2012-03-17 ENCOUNTER — Ambulatory Visit (INDEPENDENT_AMBULATORY_CARE_PROVIDER_SITE_OTHER): Payer: Medicare Other | Admitting: Cardiology

## 2012-03-17 ENCOUNTER — Encounter: Payer: Self-pay | Admitting: Cardiology

## 2012-03-17 VITALS — BP 122/60 | HR 86 | Ht 64.0 in | Wt 152.8 lb

## 2012-03-17 DIAGNOSIS — E785 Hyperlipidemia, unspecified: Secondary | ICD-10-CM

## 2012-03-17 DIAGNOSIS — I10 Essential (primary) hypertension: Secondary | ICD-10-CM

## 2012-03-17 DIAGNOSIS — I2510 Atherosclerotic heart disease of native coronary artery without angina pectoris: Secondary | ICD-10-CM

## 2012-03-17 NOTE — Patient Instructions (Addendum)
Your physician recommends that you schedule a follow-up appointment in: 6 months with Dr. McDowell. You will receive a reminder letter in the mail in about 4 months reminding you to call and schedule your appointment. If you don't receive this letter, please contact our office.  Your physician recommends that you continue on your current medications as directed. Please refer to the Current Medication list given to you today.  

## 2012-03-17 NOTE — Progress Notes (Signed)
Clinical Summary Ms. Hoffmann is a 78 y.o.female presenting for followup. She was seen in January. She reports no angina, no nitroglycerin use. Continues to work part-time.  She reports compliance with her medications, regular followup with Dr. Sherril Croon.  She did undergo a followup Cardiolite in September of 2012 that demonstrated no evidence of ischemia with preserved LVEF. We continued to manage her medically with strategy of observation.   Allergies  Allergen Reactions  . Diazepam   . Statins     Current Outpatient Prescriptions  Medication Sig Dispense Refill  . aspirin 81 MG tablet Take 81 mg by mouth daily.        [name] atenolol (TENORMIN) 25 MG tablet TAKE ONE TABLET BY MOUTH TWICE DAILY  180 tablet  3  . calcium-vitamin D (OSCAL WITH D) 500-200 MG-UNIT per tablet Take 1 tablet by mouth 2 (two) times daily.        . Coenzyme Q10 (COQ-10) 200 MG CAPS Take 1 capsule by mouth daily.        . Ferrous Sulfate (IRON) 325 (65 FE) MG TABS Take 1 tablet by mouth daily.        . furosemide (LASIX) 40 MG tablet Take 40 mg by mouth daily as needed.        . gabapentin (NEURONTIN) 100 MG capsule Take 100 mg by mouth at bedtime.       [name] levothyroxine (SYNTHROID, LEVOTHROID) 112 MCG tablet Take 112 mcg by mouth daily.        [name] LORazepam (ATIVAN) 2 MG tablet Take 2 mg by mouth at bedtime.        . Magnesium 250 MG TABS Take 1 tablet by mouth daily.        . Multiple Vitamin (MULTIVITAMIN) capsule Take 1 capsule by mouth daily.        . naproxen sodium (ANAPROX) 220 MG tablet Take 220 mg by mouth 2 (two) times daily.        [name] NEXIUM 40 MG capsule Take 1 capsule by mouth Daily.      . nitroGLYCERIN (NITROSTAT) 0.4 MG SL tablet Place 1 tablet (0.4 mg total) under the tongue as directed.  25 tablet  3  . potassium chloride (K-DUR) 10 MEQ tablet Take 10 mEq by mouth daily as needed.          Past Medical History  Diagnosis Date  . Mixed hyperlipidemia     Statin intolerant  . Gastrointestinal  bleed     Related to NSAIDs  . Coronary atherosclerosis of native coronary artery     Nonobstructive at catherization 2006  . Hiatal hernia     Moderate sized sliding hiatal hernia  . Esophageal ring     Distal esophageal ring status post dilation  . Colon polyps   . Arthritis   . Hypothyroidism     Social History Ms. Mckendree reports that she has never smoked. She has never used smokeless tobacco. Ms. Moorehouse reports that she does not drink alcohol.  Review of Systems No palpitations or dizziness. Stable appetite. No reported bleeding problems. Intermittent leg pain. Otherwise negative.  Physical Examination Filed Vitals:   03/17/12 1259  BP: 122/60  Pulse: 86    Overweight woman in no acute distress.  HEENT: Conjunctiva and lids normal, oropharynx with moist mucosa.  Neck: Supple, no bruits, no thyromegaly.  Lungs: Her to auscultation, diminished, nonlabored.  Cardiac: Regular rate and rhythm, soft systolic murmur at the base, no S3 gallop.  Abdomen:  Soft, nontender, bowel sounds present.  Skin: Warm and dry.  Extremities: No pitting edema distal pulses 1-2+.  Musculoskeletal: No kyphosis.  Neuropsychiatric: Alert and oriented x3, affect appropriate.   ECG Sinus rhythm with decreased R-wave progression, Q. In lead III.  Problem List and Plan   CAD, NATIVE VESSEL Continue medical therapy and observation. Followup ECG reviewed and stable.  ESSENTIAL HYPERTENSION, BENIGN Blood pressure is well controlled today.  HYPERLIPIDEMIA-MIXED Statin intolerant. Continue to focus on diet    Jonelle Sidle, M.D., F.A.C.C.

## 2012-03-17 NOTE — Assessment & Plan Note (Signed)
Blood pressure is well-controlled today. 

## 2012-03-17 NOTE — Assessment & Plan Note (Signed)
Continue medical therapy and observation. Followup ECG reviewed and stable.

## 2012-03-17 NOTE — Assessment & Plan Note (Signed)
Statin intolerant. Continue to focus on diet

## 2013-01-09 ENCOUNTER — Encounter: Payer: Self-pay | Admitting: Cardiology

## 2013-01-09 ENCOUNTER — Ambulatory Visit (INDEPENDENT_AMBULATORY_CARE_PROVIDER_SITE_OTHER): Payer: Medicare Other | Admitting: Cardiology

## 2013-01-09 VITALS — BP 160/68 | HR 87 | Wt 151.0 lb

## 2013-01-09 DIAGNOSIS — I2510 Atherosclerotic heart disease of native coronary artery without angina pectoris: Secondary | ICD-10-CM

## 2013-01-09 DIAGNOSIS — E785 Hyperlipidemia, unspecified: Secondary | ICD-10-CM

## 2013-01-09 DIAGNOSIS — I10 Essential (primary) hypertension: Secondary | ICD-10-CM

## 2013-01-09 MED ORDER — ATENOLOL 25 MG PO TABS: 25.0000 mg | ORAL_TABLET | Freq: Two times a day (BID) | ORAL | Status: DC

## 2013-01-09 MED ORDER — FUROSEMIDE 20 MG PO TABS: 20.0000 mg | ORAL_TABLET | Freq: Every day | ORAL | Status: DC

## 2013-01-09 MED ORDER — NITROGLYCERIN 0.4 MG SL SUBL: 0.4000 mg | SUBLINGUAL_TABLET | SUBLINGUAL | Status: DC

## 2013-01-09 NOTE — Assessment & Plan Note (Signed)
Blood pressure is up today. She says that her systolics are usually in the 120s to 130s.

## 2013-01-09 NOTE — Progress Notes (Signed)
Clinical Summary [name] is an 79 y.o.female last seen in July 2013. She reports no angina symptoms. Does state that she has intermittent ankle edema, seems to be somewhat worse. No orthopnea or PND. She is using her Lasix at 20 mg every other day.  She did undergo a followup Cardiolite in September of 2012 that demonstrated no evidence of ischemia with preserved LVEF. Would continue to manage her medically.  ECG today shows normal sinus rhythm with no acute ST segment changes.   Allergies  Allergen Reactions  . Diazepam   . Statins     Current Outpatient Prescriptions  Medication Sig Dispense Refill  . aspirin 81 MG tablet Take 81 mg by mouth daily.        [name] atenolol (TENORMIN) 25 MG tablet Take 1 tablet (25 mg total) by mouth 2 (two) times daily.  180 tablet  3  . calcium-vitamin D (OSCAL WITH D) 500-200 MG-UNIT per tablet Take 1 tablet by mouth 2 (two) times daily.        . Coenzyme Q10 (COQ-10) 200 MG CAPS Take 1 capsule by mouth daily.        . Ferrous Sulfate (IRON) 325 (65 FE) MG TABS Take 1 tablet by mouth daily.        . furosemide (LASIX) 20 MG tablet Take 1 tablet (20 mg total) by mouth daily.  90 tablet  3  . gabapentin (NEURONTIN) 100 MG capsule Take 100 mg by mouth at bedtime.       [name] levothyroxine (SYNTHROID, LEVOTHROID) 112 MCG tablet Take 112 mcg by mouth daily.        [name] LORazepam (ATIVAN) 2 MG tablet Take 2 mg by mouth at bedtime.        . Magnesium 250 MG TABS Take 1 tablet by mouth daily.        . Multiple Vitamin (MULTIVITAMIN) capsule Take 1 capsule by mouth daily.        . naproxen sodium (ANAPROX) 220 MG tablet Take 220 mg by mouth 2 (two) times daily.        . nitroGLYCERIN (NITROSTAT) 0.4 MG SL tablet Place 1 tablet (0.4 mg total) under the tongue as directed.  25 tablet  3  . potassium chloride (K-DUR) 10 MEQ tablet Take 10 mEq by mouth daily as needed.        . ranitidine (ZANTAC) 150 MG tablet Take 150 mg by mouth daily.       No current  facility-administered medications for this visit.    Past Medical History  Diagnosis Date  . Mixed hyperlipidemia     Statin intolerant  . Gastrointestinal bleed     Related to NSAIDs  . Coronary atherosclerosis of native coronary artery     Nonobstructive at catherization 2006  . Hiatal hernia     Moderate sized sliding hiatal hernia  . Esophageal ring     Distal esophageal ring status post dilation  . Colon polyps   . Arthritis   . Hypothyroidism     Social History [name] reports that she has never smoked. She has never used smokeless tobacco. [name] reports that she does not drink alcohol.  Review of Systems No palpitations or dizziness. Stable appetite. No reported bleeding problems. Otherwise negative.  Physical Examination Filed Vitals:   01/09/13 1503  BP: 160/68  Pulse: 87   Filed Weights   01/09/13 1503  Weight: 151 lb (68.493 kg)    Overweight woman in no acute  distress.  HEENT: Conjunctiva and lids normal, oropharynx with moist mucosa.  Neck: Supple, no bruits, no thyromegaly.  Lungs: Her to auscultation, diminished, nonlabored.  Cardiac: Regular rate and rhythm, soft systolic murmur at the base, no S3 gallop.  Abdomen: Soft, nontender, bowel sounds present.  Skin: Warm and dry.  Extremities: No pitting edema distal pulses 1-2+.    Problem List and Plan   CAD, NATIVE VESSEL No active angina. Plan is to continue medical therapy and observation. Refills provided for atenolol and nitroglycerin. She is having some ankle edema and we will increase her Lasix to 20 mg once daily. Continue potassium supplements.  ESSENTIAL HYPERTENSION, BENIGN Blood pressure is up today. She says that her systolics are usually in the 120s to 130s.  HYPERLIPIDEMIA-MIXED Lipids followed by Dr. Sherril Croon.     Lisa Kemp Lisa Kemp, M.D., F.A.C.C.

## 2013-01-09 NOTE — Assessment & Plan Note (Signed)
Lipids followed by Dr. Sherril Croon.

## 2013-01-09 NOTE — Assessment & Plan Note (Signed)
No active angina. Plan is to continue medical therapy and observation. Refills provided for atenolol and nitroglycerin. She is having some ankle edema and we will increase her Lasix to 20 mg once daily. Continue potassium supplements.

## 2013-01-09 NOTE — Patient Instructions (Addendum)
Lasix 20mg  should be taken daily.  Your physician wants you to follow-up in: 6 months with Dr. Diona Browner.  You will receive a reminder letter in the mail two months in advance. If you don't receive a letter, please call our office to schedule the follow-up appointment.

## 2013-07-09 ENCOUNTER — Ambulatory Visit (INDEPENDENT_AMBULATORY_CARE_PROVIDER_SITE_OTHER): Payer: Medicare Other | Admitting: Cardiology

## 2013-07-09 ENCOUNTER — Encounter: Payer: Self-pay | Admitting: Cardiology

## 2013-07-09 VITALS — BP 152/75 | HR 75 | Ht 64.0 in | Wt 147.4 lb

## 2013-07-09 DIAGNOSIS — E785 Hyperlipidemia, unspecified: Secondary | ICD-10-CM

## 2013-07-09 DIAGNOSIS — I2510 Atherosclerotic heart disease of native coronary artery without angina pectoris: Secondary | ICD-10-CM

## 2013-07-09 NOTE — Patient Instructions (Signed)
Continue all current medications. Your physician wants you to follow up in: 6 months.  You will receive a reminder letter in the mail one-two months in advance.  If you don't receive a letter, please call our office to schedule the follow up appointment   

## 2013-07-09 NOTE — Assessment & Plan Note (Signed)
Symptomatically stable, continue medical therapy and observation. 

## 2013-07-09 NOTE — Progress Notes (Signed)
Clinical Summary Lisa Kemp is an 79 y.o.female last seen in May. She reports chronic shortness of breath, no angina symptoms. She continues on medical therapy as outlined below. Still working part-time.  Cardiolite in September 2012 demonstrated no evidence of ischemia with preserved LVEF. We'll continue observation for now.  Dr. Sherril Croon has been following lipids, no change to current regimen, total cholesterol 187 most recently.   Allergies  Allergen Reactions  . Diazepam   . Statins     Current Outpatient Prescriptions  Medication Sig Dispense Refill  . aspirin 81 MG tablet Take 81 mg by mouth daily.        [name] atenolol (TENORMIN) 25 MG tablet Take 1 tablet (25 mg total) by mouth 2 (two) times daily.  180 tablet  3  . calcium-vitamin D (OSCAL WITH D) 500-200 MG-UNIT per tablet Take 1 tablet by mouth 2 (two) times daily.        . Coenzyme Q10 (COQ-10) 200 MG CAPS Take 1 capsule by mouth daily.        . Ferrous Sulfate (IRON) 325 (65 FE) MG TABS Take 1 tablet by mouth daily.        . furosemide (LASIX) 20 MG tablet Take 20 mg by mouth 2 (two) times daily.      [name] gabapentin (NEURONTIN) 100 MG capsule Take 100 mg by mouth at bedtime.       [name] levothyroxine (SYNTHROID, LEVOTHROID) 112 MCG tablet Take 112 mcg by mouth daily.        [name] LORazepam (ATIVAN) 2 MG tablet Take 2 mg by mouth at bedtime.        . Magnesium 250 MG TABS Take 1 tablet by mouth daily.        . Multiple Vitamin (MULTIVITAMIN) capsule Take 1 capsule by mouth daily.        . naproxen sodium (ANAPROX) 220 MG tablet Take 220 mg by mouth 2 (two) times daily.        . nitroGLYCERIN (NITROSTAT) 0.4 MG SL tablet Place 1 tablet (0.4 mg total) under the tongue as directed.  25 tablet  3  . potassium chloride (K-DUR) 10 MEQ tablet Take 10 mEq by mouth daily as needed.        . ranitidine (ZANTAC) 150 MG tablet Take 150-300 mg by mouth 2 (two) times daily. 2 tablets in AM and 1 tablet in PM       No current  facility-administered medications for this visit.    Past Medical History  Diagnosis Date  . Mixed hyperlipidemia     Statin intolerant  . Gastrointestinal bleed     Related to NSAIDs  . Coronary atherosclerosis of native coronary artery     Nonobstructive at catherization 2006  . Hiatal hernia     Moderate sized sliding hiatal hernia  . Esophageal ring     Distal esophageal ring status post dilation  . Colon polyps   . Arthritis   . Hypothyroidism     Social History Lisa Kemp reports that she has never smoked. She has never used smokeless tobacco. Lisa Kemp reports that she does not drink alcohol.  Review of Systems No palpitations. Chronic problems with neuropathy. No dizziness or syncope. Otherwise negative.  Physical Examination Filed Vitals:   07/09/13 1515  BP: 152/75  Pulse: 75   Filed Weights   07/09/13 1515  Weight: 147 lb 6.4 oz (66.86 kg)    Overweight woman in no acute distress.  HEENT: Conjunctiva  and lids normal, oropharynx with moist mucosa.  Neck: Supple, no bruits, no thyromegaly.  Lungs: Her to auscultation, diminished, nonlabored.  Cardiac: Regular rate and rhythm, soft systolic murmur at the base, no S3 gallop.  Abdomen: Soft, nontender, bowel sounds present.  Skin: Warm and dry.  Extremities: No pitting edema distal pulses 1-2+.    Problem List and Plan   CAD, NATIVE VESSEL Symptomatically stable, continue medical therapy and observation.  HYPERLIPIDEMIA-MIXED Continues on Pravachol, followed by Vyas.    Jonelle Sidle, M.D., F.A.C.C.

## 2013-07-09 NOTE — Assessment & Plan Note (Signed)
Continues on Pravachol, followed by Vyas.

## 2013-12-22 ENCOUNTER — Inpatient Hospital Stay (HOSPITAL_COMMUNITY)
Admission: AD | Admit: 2013-12-22 | Discharge: 2013-12-25 | DRG: 065 | Disposition: A | Discharge status: Rehabilitation Facility | Payer: Medicare Other | Source: Other Acute Inpatient Hospital | Attending: Neurology | Admitting: Neurology

## 2013-12-22 ENCOUNTER — Encounter: Payer: Self-pay | Admitting: Cardiology

## 2013-12-22 ENCOUNTER — Encounter (HOSPITAL_COMMUNITY): Payer: Self-pay | Admitting: *Deleted

## 2013-12-22 DIAGNOSIS — M2550 Pain in unspecified joint: Secondary | ICD-10-CM | POA: Diagnosis present

## 2013-12-22 DIAGNOSIS — Z8249 Family history of ischemic heart disease and other diseases of the circulatory system: Secondary | ICD-10-CM

## 2013-12-22 DIAGNOSIS — E039 Hypothyroidism, unspecified: Secondary | ICD-10-CM | POA: Diagnosis present

## 2013-12-22 DIAGNOSIS — K219 Gastro-esophageal reflux disease without esophagitis: Secondary | ICD-10-CM | POA: Diagnosis present

## 2013-12-22 DIAGNOSIS — G8190 Hemiplegia, unspecified affecting unspecified side: Secondary | ICD-10-CM | POA: Diagnosis present

## 2013-12-22 DIAGNOSIS — I619 Nontraumatic intracerebral hemorrhage, unspecified: Principal | ICD-10-CM

## 2013-12-22 DIAGNOSIS — Z7982 Long term (current) use of aspirin: Secondary | ICD-10-CM

## 2013-12-22 DIAGNOSIS — Z888 Allergy status to other drugs, medicaments and biological substances status: Secondary | ICD-10-CM

## 2013-12-22 DIAGNOSIS — F411 Generalized anxiety disorder: Secondary | ICD-10-CM | POA: Diagnosis present

## 2013-12-22 DIAGNOSIS — I10 Essential (primary) hypertension: Secondary | ICD-10-CM

## 2013-12-22 DIAGNOSIS — M129 Arthropathy, unspecified: Secondary | ICD-10-CM | POA: Diagnosis present

## 2013-12-22 HISTORY — DX: Essential (primary) hypertension: I10

## 2013-12-22 HISTORY — DX: Gastro-esophageal reflux disease without esophagitis: K21.9

## 2013-12-22 MED ORDER — LEVOTHYROXINE SODIUM 100 MCG PO TABS
100.0000 ug | ORAL_TABLET | Freq: Every day | ORAL | Status: DC
  Administered 2013-12-23 – 2013-12-25 (×3): 100 ug via ORAL
  Filled 2013-12-22 (×4): qty 1

## 2013-12-22 MED ORDER — RANITIDINE HCL 150 MG/10ML PO SYRP
150.0000 mg | ORAL_SOLUTION | Freq: Every day | ORAL | Status: DC
  Administered 2013-12-23: 150 mg via ORAL
  Filled 2013-12-22 (×2): qty 10

## 2013-12-22 MED ORDER — ACETAMINOPHEN 325 MG PO TABS
650.0000 mg | ORAL_TABLET | ORAL | Status: DC | PRN
  Administered 2013-12-23: 650 mg via ORAL
  Filled 2013-12-22: qty 2

## 2013-12-22 MED ORDER — LABETALOL HCL 5 MG/ML IV SOLN
10.0000 mg | INTRAVENOUS | Status: DC | PRN
  Administered 2013-12-23: 20 mg via INTRAVENOUS
  Administered 2013-12-23: 10 mg via INTRAVENOUS
  Administered 2013-12-23: 40 mg via INTRAVENOUS
  Filled 2013-12-22: qty 8
  Filled 2013-12-22 (×2): qty 4

## 2013-12-22 MED ORDER — LORAZEPAM 1 MG PO TABS
2.0000 mg | ORAL_TABLET | Freq: Every day | ORAL | Status: DC
  Administered 2013-12-23 – 2013-12-24 (×3): 2 mg via ORAL
  Filled 2013-12-22 (×3): qty 2

## 2013-12-22 MED ORDER — ACETAMINOPHEN 650 MG RE SUPP: 650.0000 mg | RECTAL | Status: DC | PRN

## 2013-12-22 MED ORDER — PANTOPRAZOLE SODIUM 40 MG IV SOLR
40.0000 mg | Freq: Every day | INTRAVENOUS | Status: DC
  Administered 2013-12-23 (×2): 40 mg via INTRAVENOUS
  Filled 2013-12-22 (×3): qty 40

## 2013-12-22 MED ORDER — SENNOSIDES-DOCUSATE SODIUM 8.6-50 MG PO TABS
1.0000 | ORAL_TABLET | Freq: Two times a day (BID) | ORAL | Status: DC
  Administered 2013-12-23 – 2013-12-24 (×5): 1 via ORAL
  Filled 2013-12-22 (×5): qty 1

## 2013-12-22 NOTE — H&P (Addendum)
Admission H&P    Chief Complaint: Right sided weakness HPI: Lisa Kemp is an 80 y.o. female who was at her baseline today.  Was able to go to work and after returning home was taking her blood pressure and noted that the cuff was abnormally tight.  She tried to switch the cuff to the left and noted that she could not use her right arm well.  She then noted that the right side of her face was numb.  When she attempted to get up and alert her grandson she noted that her right leg would not support her.  EMS was called and the patient was brought to [facility].  Date last known well: Date: 12/22/2013 Time last known well: Time: 17:00 tPA Given: No: ICH  Past Medical History  Diagnosis Date  . GERD (gastroesophageal reflux disease)   . Arthritis   . Hypothyroidism   . Hypertension     Past Surgical History  Procedure Laterality Date  . Colon surgery      Family history: CAD, cancer  Social History:  reports that she has never smoked. She does not have any smokeless tobacco history on file. She reports that she does not drink alcohol or use illicit drugs.  Allergies:  Allergies  Allergen Reactions  . Benzodiazepines   . Diazepam    Medications: Pior to admission:  Synthroid, Zantac, Iron, Calcium, ASA, Atenolol, MVI, Lasix, Potassium, CoQ-10 and Ativan  ROS: History obtained from the patient  General ROS: negative for - chills, fatigue, fever, night sweats, weight gain or weight loss Psychological ROS: negative for - behavioral disorder, hallucinations, memory difficulties, mood swings or suicidal ideation Ophthalmic ROS: negative for - blurry vision, double vision, eye pain or loss of vision ENT ROS: mucus production Allergy and Immunology ROS: negative for - hives or itchy/watery eyes Hematological and Lymphatic ROS: negative for - bleeding problems, bruising or swollen lymph nodes Endocrine ROS: negative for - galactorrhea, hair pattern changes,  polydipsia/polyuria or temperature intolerance Respiratory ROS: negative for - cough, hemoptysis, shortness of breath or wheezing Cardiovascular ROS: negative for - chest pain, dyspnea on exertion, edema or irregular heartbeat Gastrointestinal ROS: negative for - abdominal pain, diarrhea, hematemesis, nausea/vomiting or stool incontinence Genito-Urinary ROS: negative for - dysuria, hematuria, incontinence or urinary frequency/urgency Musculoskeletal ROS: back pain Neurological ROS: as noted in HPI Dermatological ROS: negative for rash and skin lesion changes  Physical Examination: Blood pressure 188/70, pulse 90, temperature 98.6 F (37 C), temperature source Oral, resp. rate 21, height 5\' 2"  (1.575 m), weight 70.2 kg (154 lb 12.2 oz), SpO2 95.00%.  General Examination: HEENT-  Normocephalic, no lesions, without obvious abnormality.  Normal external eye and conjunctiva.  Normal TM's bilaterally.  Normal auditory canals and external ears. Normal external nose, mucus membranes and septum.  Normal pharynx. Neck supple with no masses, nodes, nodules or enlargement. Cardiovascular - S1, S2 normal Lungs - chest clear, no wheezing, rales, normal symmetric air entry Abdomen - soft, non-tender; bowel sounds normal; no masses,  no organomegaly Extremities - no edema  Neurologic Examination: Mental Status: Alert, oriented, thought content appropriate.  Speech fluent without evidence of aphasia.  Able to follow 3 step commands without difficulty. Cranial Nerves: II: Discs flat bilaterally; Visual fields grossly normal, pupils equal, round, reactive to light and accommodation III,IV, VI: ptosis not present, extra-ocular motions intact bilaterally V,VII: smile symmetric, facial light touch sensation normal bilaterally VIII: hearing normal bilaterally IX,X: gag reflex present XI: bilateral shoulder shrug XII: midline  tongue extension Motor: Right : Upper extremity   4/5    Left:     Upper extremity    5/5  Lower extremity   4/5     Lower extremity   5/5 Tone and bulk:normal tone throughout; no atrophy noted Sensory: Pinprick and light touch intact throughout, bilaterally Deep Tendon Reflexes: 2+ and symmetric with absent AJ's bilaterally Plantars: Right: mute   Left: downgoing Cerebellar: Finger-to-nose and heel-to-shin testing dysmetric on the right Gait: Unable to test CV: pulses palpable throughout     Laboratory Studies:  Reviewed from [facility] and unremarkable.   Basic Metabolic Panel: No results found for this basename: NA, K, CL, CO2, GLUCOSE, BUN, CREATININE, CALCIUM, MG, PHOS,  in the last 168 hours  Liver Function Tests: No results found for this basename: AST, ALT, ALKPHOS, BILITOT, PROT, ALBUMIN,  in the last 168 hours No results found for this basename: LIPASE, AMYLASE,  in the last 168 hours No results found for this basename: AMMONIA,  in the last 168 hours  CBC: No results found for this basename: WBC, NEUTROABS, HGB, HCT, MCV, PLT,  in the last 168 hours  Cardiac Enzymes: No results found for this basename: CKTOTAL, CKMB, CKMBINDEX, TROPONINI,  in the last 168 hours  BNP: No components found with this basename: POCBNP,   CBG: No results found for this basename: GLUCAP,  in the last 168 hours  Microbiology: No results found for this or any previous visit.  Coagulation Studies: No results found for this basename: LABPROT, INR,  in the last 72 hours  Urinalysis: No results found for this basename: COLORURINE, APPERANCEUR, LABSPEC, PHURINE, GLUCOSEU, HGBUR, BILIRUBINUR, KETONESUR, PROTEINUR, UROBILINOGEN, NITRITE, LEUKOCYTESUR,  in the last 168 hours  Lipid Panel:  No results found for this basename: chol, trig, hdl, cholhdl, vldl, ldlcalc    HgbA1C:  No results found for this basename: HGBA1C    Urine Drug Screen:   No results found for this basename: labopia, cocainscrnur, labbenz, amphetmu, thcu, labbarb    Alcohol Level: No results found for  this basename: ETH,  in the last 168 hours  Other results: EKG: sinus rhythm at 97 bpm.  Imaging: No results found.  Assessment: 80 y.o. female presenting with a right hemiparesis.  Head CT performed at [facility] and films have been reviewed.  Imaging shows a left internal capsule hemorrhage (7X60mm).  Likely secondary to hypertension.  Patient accepted in transfer from [place].  NIHSS at [facility] 5.  Current NIHSS of 4 with no sensory complaints.    Stroke Risk Factors - hypertension  Plan: 1. HgbA1c, fasting lipid panel 2. MRI of the brain without contrast in AM to follow up hemorrhage 3. PT consult, OT consult, Speech consult 4. Echocardiogram 5. Carotid dopplers 6. Prophylactic therapy-None 7. BP control 8. Telemetry monitoring 9. Frequent neuro checks 10. Admission to 3100  This patient is critically ill and at significant risk of neurological worsening, death and care requires constant monitoring of vital signs, hemodynamics,respiratory and cardiac monitoring, neurological assessment, discussion with family, other specialists and medical decision making of high complexity. I spent 60 minutes of neurocritical care time  in the care of  this patient.  Alexis Goodell, MD [facility] [phone] 12/22/2013  11:57 PM   Alexis Goodell, MD [facility] [phone] 12/22/2013, 11:42 PM

## 2013-12-23 ENCOUNTER — Inpatient Hospital Stay (HOSPITAL_COMMUNITY): Payer: Medicare Other

## 2013-12-23 DIAGNOSIS — I059 Rheumatic mitral valve disease, unspecified: Secondary | ICD-10-CM

## 2013-12-23 DIAGNOSIS — I10 Essential (primary) hypertension: Secondary | ICD-10-CM

## 2013-12-23 LAB — HEMOGLOBIN A1C
HEMOGLOBIN A1C: 5.5 % (ref <5.7)
Mean Plasma Glucose: 111 mg/dL (ref <117)

## 2013-12-23 LAB — LIPID PANEL
CHOLESTEROL: 157 mg/dL (ref 0–200)
HDL: 35 mg/dL — AB (ref >39)
LDL Cholesterol: 84 mg/dL (ref 0–99)
Total CHOL/HDL Ratio: 4.5 RATIO
Triglycerides: 188 mg/dL — ABNORMAL HIGH (ref <150)
VLDL: 38 mg/dL (ref 0–40)

## 2013-12-23 LAB — MRSA PCR SCREENING: MRSA BY PCR: NEGATIVE

## 2013-12-23 MED ORDER — FAMOTIDINE 20 MG PO TABS
20.0000 mg | ORAL_TABLET | Freq: Every day | ORAL | Status: DC
  Administered 2013-12-23 – 2013-12-25 (×3): 20 mg via ORAL
  Filled 2013-12-23 (×3): qty 1

## 2013-12-23 NOTE — Progress Notes (Signed)
OT Cancellation Note  Patient Details Name: Lisa Kemp MRN: [id number] DOB: [birth year]   Cancelled Treatment:    Reason Eval/Treat Not Completed: Medical issues which prohibited therapy Bed rest [facility], OTR/L  [phone] 12/23/2013 12/23/2013, 10:15 AM

## 2013-12-23 NOTE — Evaluation (Signed)
Speech Language Pathology Evaluation Patient Details Name: Lisa Kemp MRN: [id number] DOB: [birth year] Today's Date: 12/23/2013 Time: 8921-1941 SLP Time Calculation (min): 14 min  Problem List:  Patient Active Problem List   Diagnosis Date Noted  . Intracerebral hemorrhage 12/22/2013   Past Medical History:  Past Medical History  Diagnosis Date  . GERD (gastroesophageal reflux disease)   . Arthritis   . Hypothyroidism   . Hypertension    Past Surgical History:  Past Surgical History  Procedure Laterality Date  . Colon surgery     HPI:  80 y.o. admitted after decreased use of right arm and facial numbness.  PMH:  GERD, arthritis, HTN.  Head Ct at [facility] revealed a left internal capsule hemorrhage.    Assessment / Plan / Recommendation Clinical Impression  Pt.'s overall cognitive abilities appear functional.  Possible, intermittent working memory deficits, however she recalled information regarding current hospitalization and exhibited prospective memory for upcoming tests.  Speech is intelligible and language intact.  No ST needed at this time.     SLP Assessment  Patient does not need any further Speech Lanaguage Pathology Services    Follow Up Recommendations  None    Frequency and Duration        Pertinent Vitals/Pain WDL       SLP Evaluation Prior Functioning  Cognitive/Linguistic Baseline: Within functional limits Type of Home: House  Lives With: Alone Available Help at Discharge: Family (grandson lives "across the driveway") Vocation: Part time employment (drug store)   Cognition  Overall Cognitive Status: Within Functional Limits for tasks assessed Arousal/Alertness: Awake/alert Orientation Level: Oriented X4 Attention: Sustained Sustained Attention: Appears intact Memory:  (overall functional, ?mild impairment) Awareness: Appears intact Problem Solving: Appears intact Safety/Judgment: Appears intact    Comprehension  Auditory  Comprehension Overall Auditory Comprehension: Appears within functional limits for tasks assessed Visual Recognition/Discrimination Discrimination: Not tested Reading Comprehension Reading Status: Not tested    Expression Expression Primary Mode of Expression: Verbal Verbal Expression Overall Verbal Expression: Appears within functional limits for tasks assessed Pragmatics: No impairment Written Expression Dominant Hand: Right Written Expression: Not tested   Oral / Motor Oral Motor/Sensory Function Overall Oral Motor/Sensory Function: Impaired Labial ROM: Reduced right Labial Symmetry: Abnormal symmetry right Labial Strength: Within Functional Limits Lingual Symmetry: Within Functional Limits Lingual Strength: Within Functional Limits Facial ROM: Within Functional Limits Facial Symmetry: Within Functional Limits Mandible: Within Functional Limits Motor Speech Overall Motor Speech: Appears within functional limits for tasks assessed Intelligibility: Intelligible Motor Planning: Witnin functional limits   Orbie Pyo [employer] [phone]  12/23/2013

## 2013-12-23 NOTE — Progress Notes (Signed)
  Echocardiogram 2D Echocardiogram has been performed.  Carney Corners 12/23/2013, 4:50 PM

## 2013-12-23 NOTE — Progress Notes (Signed)
Stroke Team Progress Note  HISTORY Lisa Kemp is an 80 y.o. female who was at her baseline today 12/22/2013. Was able to go to work and after returning home was taking her blood pressure and noted that the cuff was abnormally tight. She tried to switch the cuff to the left and noted that she could not use her right arm well. She then noted that the right side of her face was numb. When she attempted to get up and alert her grandson she noted that her right leg would not support her. EMS was called and the patient was brought to [facility]. last known well 12/22/2013 at 1700. Patient was not administered TPA secondary to hemorrhage. She was admitted to the neuro ICU for further evaluation and treatment.  SUBJECTIVE Her RN is at the bedside.    OBJECTIVE Most recent Vital Signs: Filed Vitals:   12/23/13 0700 12/23/13 0754 12/23/13 0800 12/23/13 0900  BP: 161/65  173/68 176/74  Pulse: 78  81 78  Temp:  98.2 F (36.8 C)    TempSrc:  Oral    Resp: 16  16 20   Height:      Weight:      SpO2: 96%  96% 96%   CBG (last 3)  No results found for this basename: GLUCAP,  in the last 72 hours  IV Fluid Intake:     MEDICATIONS  . levothyroxine  100 mcg Oral QAC breakfast  . LORazepam  2 mg Oral QHS  . pantoprazole (PROTONIX) IV  40 mg Intravenous QHS  . ranitidine  150 mg Oral QHS  . senna-docusate  1 tablet Oral BID   PRN:  acetaminophen, acetaminophen, labetalol  Diet:  Cardiac thin liquids Activity:  Bedrest DVT Prophylaxis:  SCDs   CLINICALLY SIGNIFICANT STUDIES Basic Metabolic Panel: No results found for this basename: NA, K, CL, CO2, GLUCOSE, BUN, CREATININE, CALCIUM, MG, PHOS,  in the last 168 hours Liver Function Tests: No results found for this basename: AST, ALT, ALKPHOS, BILITOT, PROT, ALBUMIN,  in the last 168 hours CBC: No results found for this basename: WBC, NEUTROABS, HGB, HCT, MCV, PLT,  in the last 168 hours Coagulation: No results found for this basename:  LABPROT, INR,  in the last 168 hours Cardiac Enzymes: No results found for this basename: CKTOTAL, CKMB, CKMBINDEX, TROPONINI,  in the last 168 hours Urinalysis: No results found for this basename: COLORURINE, APPERANCEUR, LABSPEC, PHURINE, GLUCOSEU, HGBUR, BILIRUBINUR, KETONESUR, PROTEINUR, UROBILINOGEN, NITRITE, LEUKOCYTESUR,  in the last 168 hours Lipid Panel    Component Value Date/Time   CHOL 157 12/23/2013 0228   TRIG 188* 12/23/2013 0228   HDL 35* 12/23/2013 0228   CHOLHDL 4.5 12/23/2013 0228   VLDL 38 12/23/2013 0228   LDLCALC 84 12/23/2013 0228   HgbA1C  No results found for this basename: HGBA1C    Urine Drug Screen:   No results found for this basename: labopia,  cocainscrnur,  labbenz,  amphetmu,  thcu,  labbarb    Alcohol Level: No results found for this basename: ETH,  in the last 168 hours  No results found.  CT of the brain  12/22/2013 ([place]) 2x1x1 L CR hemorhage  MRI of the brain    MRA of the brain    2D Echocardiogram   CXR    EKG    Therapy Recommendations   Physical Exam   Pleasant elderly Caucasian lady not in distress.Awake alert. Afebrile. Head is nontraumatic. Neck is supple without bruit. Hearing is normal.  Cardiac exam no murmur or gallop. Lungs are clear to auscultation. Distal pulses are well felt. Neurological Exam :  Awake alert oriented x3 normal speech and language. Extraocular movements are full range without nystagmus. Blinks to threat bilaterally. Fundi were not visualized. Vision acuity seems adequate. Mild right lower facial weakness. Tongue midline. Motor system exam revealed no upper or lower extremity drift. Mild weakness of right grip and intrinsic hand muscles. Orbits left-to-right upper extremity. Minimum right hip exam ankle dorsiflexor weakness. Sensation appears to be preserved bilaterally. Finger-to-nose and knee to heel coordination appears accurate. Gait was not tested. ASSESSMENT [name] is a 80 y.o. female  presenting with right sided weakness. Imaging confirms a left corona radiata hemorrhage. Hemorrhage felt to be secondary to accelerated hypertension.  On aspirin 81 mg orally every day prior to admission. Stroke work up underway.  Accelerated hypertension 174/105 on arrival to [facility] day # 1  TREATMENT/PLAN  Continue ICU level care x 24h  SBP goal < 160  Keep in bed today, OOB in am  Repeat CT head in am  Burnetta Sabin, MSN, RN, ANVP-BC, AGPCNP-BC [facility] Pager: [phone] 12/23/2013 9:24 AM This patient is critically ill and at significant risk of neurological worsening, death and care requires constant monitoring of vital signs, hemodynamics,respiratory and cardiac monitoring,review of multiple databases, neurological assessment, discussion with family, other specialists and medical decision making of high complexity. I spent 30 minutes of neurocritical care time  in the care of  this patient. I have personally obtained a history, examined the patient, evaluated imaging results, and formulated the assessment and plan of care. I agree with the above. Antony Contras, MD   To contact Stroke Continuity provider, please refer to [email or web address] After hours, contact General Neurology

## 2013-12-23 NOTE — Progress Notes (Signed)
PT Cancellation Note  Patient Details Name: Lisa Kemp MRN: [id number] DOB: 10-01-1923   Cancelled Treatment:    Reason Eval/Treat Not Completed: Medical issues which prohibited therapy;Patient not medically ready. Pt remains on bedrest for 24 hours. Will re-attempt to work with pt at next available time.    [place], [place] 12/23/2013, 9:50 AM

## 2013-12-24 ENCOUNTER — Inpatient Hospital Stay (HOSPITAL_COMMUNITY): Payer: Medicare Other

## 2013-12-24 DIAGNOSIS — G8110 Spastic hemiplegia affecting unspecified side: Secondary | ICD-10-CM

## 2013-12-24 DIAGNOSIS — I619 Nontraumatic intracerebral hemorrhage, unspecified: Secondary | ICD-10-CM

## 2013-12-24 MED ORDER — FUROSEMIDE 20 MG PO TABS
20.0000 mg | ORAL_TABLET | Freq: Every day | ORAL | Status: DC
  Administered 2013-12-24 – 2013-12-25 (×2): 20 mg via ORAL
  Filled 2013-12-24 (×2): qty 1

## 2013-12-24 MED ORDER — FERROUS SULFATE 325 (65 FE) MG PO TABS
325.0000 mg | ORAL_TABLET | Freq: Every day | ORAL | Status: DC
  Administered 2013-12-25: 325 mg via ORAL
  Filled 2013-12-24 (×2): qty 1

## 2013-12-24 MED ORDER — GABAPENTIN 100 MG PO CAPS
100.0000 mg | ORAL_CAPSULE | Freq: Every day | ORAL | Status: DC
  Administered 2013-12-24: 100 mg via ORAL
  Filled 2013-12-24 (×2): qty 1

## 2013-12-24 MED ORDER — ATENOLOL 25 MG PO TABS
25.0000 mg | ORAL_TABLET | Freq: Two times a day (BID) | ORAL | Status: DC
  Administered 2013-12-24 – 2013-12-25 (×3): 25 mg via ORAL
  Filled 2013-12-24 (×4): qty 1

## 2013-12-24 MED ORDER — ONDANSETRON HCL 4 MG/2ML IJ SOLN
4.0000 mg | Freq: Four times a day (QID) | INTRAMUSCULAR | Status: DC | PRN
  Administered 2013-12-24 – 2013-12-25 (×3): 4 mg via INTRAVENOUS
  Filled 2013-12-24 (×3): qty 2

## 2013-12-24 MED ORDER — POTASSIUM CHLORIDE ER 10 MEQ PO TBCR
10.0000 meq | EXTENDED_RELEASE_TABLET | Freq: Every day | ORAL | Status: DC
  Administered 2013-12-24 – 2013-12-25 (×2): 10 meq via ORAL
  Filled 2013-12-24 (×2): qty 1

## 2013-12-24 MED ORDER — STROKE: EARLY STAGES OF RECOVERY BOOK
Freq: Once | Status: AC
  Administered 2013-12-24: 1
  Filled 2013-12-24: qty 1

## 2013-12-24 NOTE — Consult Note (Signed)
Physical Medicine and Rehabilitation Consult Reason for Consult: Left corona radiata hemorrhage Referring Physician: Dr. Leonie Man   HPI: Lisa Kemp is a 80 y.o. right-handed female with history of hypertension. Patient was independent prior to admission living alone and still working part time. Admitted 12/22/2013 with right-sided weakness upon transfer from [facility]. Cranial CT scan at outside hospital showed left internal capsule hemorrhage 7 x 9 mm. A followup Cranial CT scan showed a 1.2 x 0.7 cm focal intraparenchymal hemorrhage at the left posterior basal ganglia did MRI showed small subacute hemorrhage left hemisphere white matter slightly increased in size since prior study of 12/22/2013 with mild edema without significant mass effect. MRA of the brain negative. Echocardiogram ejection fraction of 70% no wall motion abnormalities. Neurology service followup advise conservative care. Noted accelerated hypertension 174/105 on admission and monitored. Patient had been on aspirin prior to admission advise to hold and resume in one to 2 weeks. Physical therapy evaluation completed 12/24/2013 with recommendations for physical medicine rehabilitation consult.   Review of Systems  Gastrointestinal:       GERD  Musculoskeletal: Positive for joint pain and myalgias.  Psychiatric/Behavioral:       Anxiety  All other systems reviewed and are negative.  Past Medical History  Diagnosis Date  . GERD (gastroesophageal reflux disease)   . Arthritis   . Hypothyroidism   . Hypertension    Past Surgical History  Procedure Laterality Date  . Colon surgery     History reviewed. No pertinent family history. Social History:  reports that she has never smoked. She does not have any smokeless tobacco history on file. She reports that she does not drink alcohol or use illicit drugs. Allergies:  Allergies  Allergen Reactions  . Diazepam Other (See Comments)    Could not walk    Medications Prior to Admission  Medication Sig Dispense Refill  . aspirin 81 MG tablet Take 81 mg by mouth daily.      [name] atenolol (TENORMIN) 25 MG tablet Take 25 mg by mouth 2 (two) times daily.      . ferrous sulfate 325 (65 FE) MG tablet Take 325 mg by mouth daily with breakfast.      . furosemide (LASIX) 20 MG tablet Take 20 mg by mouth daily.      [name] gabapentin (NEURONTIN) 100 MG capsule Take 100 mg by mouth at bedtime.      [name] levothyroxine (SYNTHROID, LEVOTHROID) 112 MCG tablet Take 112 mcg by mouth daily before breakfast.      . LORazepam (ATIVAN) 2 MG tablet Take 2 mg by mouth every 6 (six) hours as needed for anxiety.      . Magnesium 500 MG CAPS Take 500 mg by mouth every evening.      . potassium chloride (K-DUR) 10 MEQ tablet Take 10 mEq by mouth daily.      . ranitidine (ZANTAC) 150 MG tablet Take 150 mg by mouth 2 (two) times daily.        Home: Home Living Family/patient expects to be discharged to:: Private residence Living Arrangements: Alone Available Help at Discharge: Family (grandson lives "across the driveway") Type of Home: [place] Access: Stairs to enter Technical brewer of Steps: 1 Entrance Stairs-Rails: None Home Layout: One level [place]: Environmental consultant - standard;Cane - single point  Lives With: Alone  Functional History: Prior Function Level of Independence: Independent Functional Status:  Mobility: Bed Mobility Overal bed mobility: Needs Assistance Bed Mobility:  Supine to Sit Supine to sit: Supervision General bed mobility comments: increased time to perform Transfers Overall transfer level: Needs assistance Equipment used: Rolling walker (2 wheeled);1 person hand held assist Transfers: Sit to/from Stand Sit to Stand: Min assist General transfer comment: Min assist for stability and to keep from losing balance posteriorly with HHA, min assist/min guard with use of RW.  VCs for safe hand placement, poor control of descent into  chair Ambulation/Gait Ambulation/Gait assistance: Min assist (occasional Moderate assist with 1 person HHA, 2 LOB noted) Ambulation Distance (Feet): 24 Feet (34ft to the chair, 72ft to toilet, 67ft to chair from toilet) Assistive device: Rolling walker (2 wheeled);1 person hand held assist Gait Pattern/deviations: Decreased stride length;Step-through pattern;Trunk flexed;Drifts right/left Gait velocity: decreased Gait velocity interpretation: <1.8 ft/sec, indicative of risk for recurrent falls General Gait Details: patient unsteady with gait.  Attempted ambulation without device using 1 person HHA and patient was barely able to tolerate ambulation safely without moderate assistance and had 2 noted LOB.  Provided patient with RW and patient was asked to ambulate again, still required minimal assist for stability. (Patient was independent with a cane PTA).Patient also became very fatigued and had increased DOE during minimal ambulation and activity. SpO2 >95% throughout but DOE 3/4    ADL:    Cognition: Cognition Overall Cognitive Status: No family/caregiver present to determine baseline cognitive functioning Arousal/Alertness: Awake/alert Orientation Level: Oriented X4 Attention: Sustained Sustained Attention: Appears intact Memory:  (overall functional, ?mild impairment) Awareness: Appears intact Problem Solving: Appears intact Safety/Judgment: Appears intact Cognition Arousal/Alertness: Awake/alert Behavior During Therapy: WFL for tasks assessed/performed Overall Cognitive Status: No family/caregiver present to determine baseline cognitive functioning Area of Impairment: Safety/judgement;Problem solving Safety/Judgement: Decreased awareness of safety;Decreased awareness of deficits Problem Solving: Slow processing General Comments: patient continues to state that her poor balance is related to "having milk and cereal today" and not because of the bleed she had in her brain or the  weakness on her right side  Blood pressure 141/68, pulse 88, temperature 97.9 F (36.6 C), temperature source Oral, resp. rate 20, height 5\' 2"  (1.575 m), weight 70.2 kg (154 lb 12.2 oz), SpO2 98.00%. Physical Exam  Vitals reviewed. Constitutional: She is oriented to person, place, and time.  HENT:  Head: Normocephalic.  Eyes: EOM are normal.  Neck: Normal range of motion. Neck supple. No thyromegaly present.  Cardiovascular: Normal rate and regular rhythm.   Respiratory: Effort normal and breath sounds normal. No respiratory distress.  GI: Soft. Bowel sounds are normal. She exhibits no distension.  Neurological: She is alert and oriented to person, place, and time.  Follows full commands  Skin: Skin is warm and dry.   3 minus/5 right deltoid, 3+ right biceps triceps grip 5/5 left deltoid, bicep, tricep, grip /5 right hip flexor knee extensor ankle dorsiflexor and plantar flexor 5/5 left hip flexor knee extensor ankle dysfunction plantarflexion Sensation mildly reduced to light touch in the right hand. Intact in the right lower extremity and on the left side. No evidence of ataxia on cerebellar testing Musculoskeletal valgus deformity right greater than left knee  No results found for this or any previous visit (from the past 24 hour(s)). Ct Head Wo Contrast  12/24/2013   CLINICAL DATA:  Follow-up intraparenchymal hemorrhage.  EXAM: CT HEAD WITHOUT CONTRAST  TECHNIQUE: Contiguous axial images were obtained from the base of the skull through the vertex without intravenous contrast.  COMPARISON:  CT of the head performed 12/22/2013, and MRI of the brain  performed 12/23/2016  FINDINGS: The small 1.2 x 0.7 cm focal intraparenchymal hemorrhage at the left posterior basal ganglia is relatively stable in size from recent prior MRI. Mild associated vasogenic edema is again seen. As before, this extends nearly to the left lateral ventricle, without evidence of intraventricular bleed. No significant  midline shift is seen.  Mild scattered periventricular and subcortical white matter change likely reflects small vessel ischemic microangiopathy.  The posterior fossa, including the cerebellum, brainstem and fourth ventricle, is within normal limits. The third and lateral ventricles are unremarkable in appearance. The cerebral hemispheres demonstrate grossly normal gray-white differentiation.  There is no evidence of fracture; visualized osseous structures are unremarkable in appearance. The visualized portions of the orbits are within normal limits. The paranasal sinuses and mastoid air cells are well-aerated. No significant soft tissue abnormalities are seen.  IMPRESSION: 1. 1.2 x 0.7 cm focal intraparenchymal hemorrhage at the left posterior basal ganglia is relatively stable in size from recent prior MRI, though mildly increased in size from prior CT. Mild associated vasogenic edema again seen. No evidence of midline shift. The location remains most compatible with a hypertensive bleed. 2. No evidence of acute infarct. 3. Scattered small vessel ischemic microangiopathy.   Electronically Signed   By: Garald Balding M.D.   On: 12/24/2013 04:58   Mr Virgel Paling Wo Contrast  12/23/2013   CLINICAL DATA:  80 year old female with sudden onset inability to use the right upper extremity, right face numbness. Initial encounter.  EXAM: MRI HEAD WITHOUT CONTRAST  MRA HEAD WITHOUT CONTRAST  TECHNIQUE: Multiplanar, multiecho pulse sequences of the brain and surrounding structures were obtained without intravenous contrast. Angiographic images of the head were obtained using MRA technique without contrast.  COMPARISON:  [facility] CT without contrast 12/22/2013.  FINDINGS: MRI HEAD FINDINGS  Susceptibility artifact and signal abnormality associated with blood products at the left corona radiata tracking toward the posterior limb of the left internal capsule. This corresponds to the intracranial hemorrhage  identified on 12/22/2013. The area of signal abnormality has mildly increased since that time, now up to 13 mm diameter (previously 9 mm). The blood products are T2 hyperintense and T1 hypo to isointense. Mild surrounding edema. No significant mass effect.  No larger area of underlying restricted diffusion. No other acute or chronic intracranial hemorrhage identified. Major intracranial vascular flow voids are preserved.  Additional scattered cerebral white matter T2 and FLAIR hyperintensity in a nonspecific pattern. No cortical encephalomalacia identified. No ventriculomegaly. Negative basilar cisterns. Negative pituitary, cervicomedullary junction and visualized cervical spine. Normal bone marrow signal. Postoperative changes to the globes. Visualized paranasal sinuses and mastoids are clear. Visible internal auditory structures appear normal. Visualized scalp soft tissues are within normal limits.  MRA HEAD FINDINGS  Study is mildly degraded by motion artifact despite repeated imaging attempts.  Antegrade flow in the posterior circulation with mildly dominant distal right vertebral artery. Patent PICA origins. Patent vertebrobasilar junction. No basilar stenosis. SCA and PCA origins are normal. Posterior communicating arteries are present. Bilateral PCA branches are within normal limits.  Antegrade flow in both ICA siphons. No ICA stenosis. Ophthalmic and posterior communicating artery origins are normal. Normal carotid termini, MCA and ACA origins.  Mild irregularity and stenosis of the left ACA A1 segment. Anterior communicating artery within normal limits. Azygos proximal ACA configuration incidentally noted. Bilateral ACA branches within normal limits. Visualized bilateral MCA branches are patent with mild irregularity. M1 segments are within normal limits (incidental early branching on the  right).  IMPRESSION: 1. Solitary small subacute hemorrhage in the left hemisphere white matter is slightly increased in  size since 12/22/2013 (estimated volume 1-2 mL). Mild edema and no significant mass effect. 2. Negative for age intracranial MRA.   Electronically Signed   By: Lars Pinks M.D.   On: 12/23/2013 13:18   Mr Brain Wo Contrast  12/23/2013   CLINICAL DATA:  80 year old female with sudden onset inability to use the right upper extremity, right face numbness. Initial encounter.  EXAM: MRI HEAD WITHOUT CONTRAST  MRA HEAD WITHOUT CONTRAST  TECHNIQUE: Multiplanar, multiecho pulse sequences of the brain and surrounding structures were obtained without intravenous contrast. Angiographic images of the head were obtained using MRA technique without contrast.  COMPARISON:  [facility] CT without contrast 12/22/2013.  FINDINGS: MRI HEAD FINDINGS  Susceptibility artifact and signal abnormality associated with blood products at the left corona radiata tracking toward the posterior limb of the left internal capsule. This corresponds to the intracranial hemorrhage identified on 12/22/2013. The area of signal abnormality has mildly increased since that time, now up to 13 mm diameter (previously 9 mm). The blood products are T2 hyperintense and T1 hypo to isointense. Mild surrounding edema. No significant mass effect.  No larger area of underlying restricted diffusion. No other acute or chronic intracranial hemorrhage identified. Major intracranial vascular flow voids are preserved.  Additional scattered cerebral white matter T2 and FLAIR hyperintensity in a nonspecific pattern. No cortical encephalomalacia identified. No ventriculomegaly. Negative basilar cisterns. Negative pituitary, cervicomedullary junction and visualized cervical spine. Normal bone marrow signal. Postoperative changes to the globes. Visualized paranasal sinuses and mastoids are clear. Visible internal auditory structures appear normal. Visualized scalp soft tissues are within normal limits.  MRA HEAD FINDINGS  Study is mildly degraded by motion  artifact despite repeated imaging attempts.  Antegrade flow in the posterior circulation with mildly dominant distal right vertebral artery. Patent PICA origins. Patent vertebrobasilar junction. No basilar stenosis. SCA and PCA origins are normal. Posterior communicating arteries are present. Bilateral PCA branches are within normal limits.  Antegrade flow in both ICA siphons. No ICA stenosis. Ophthalmic and posterior communicating artery origins are normal. Normal carotid termini, MCA and ACA origins.  Mild irregularity and stenosis of the left ACA A1 segment. Anterior communicating artery within normal limits. Azygos proximal ACA configuration incidentally noted. Bilateral ACA branches within normal limits. Visualized bilateral MCA branches are patent with mild irregularity. M1 segments are within normal limits (incidental early branching on the right).  IMPRESSION: 1. Solitary small subacute hemorrhage in the left hemisphere white matter is slightly increased in size since 12/22/2013 (estimated volume 1-2 mL). Mild edema and no significant mass effect. 2. Negative for age intracranial MRA.   Electronically Signed   By: Lars Pinks M.D.   On: 12/23/2013 13:18    Assessment/Plan: Diagnosis: Left subcortical intracranial hemorrhage with right hemiparesis 1. Does the need for close, 24 hr/day medical supervision in concert with the patient's rehab needs make it unreasonable for this patient to be served in a less intensive setting? Yes 2. Co-Morbidities requiring supervision/potential complications: HTN,Diffuse osteoarthritis in hands and knees 3. Due to safety, skin/wound care, disease management, medication administration, pain management and patient education, does the patient require 24 hr/day rehab nursing? Yes 4. Does the patient require coordinated care of a physician, rehab nurse, PT (1-2 hrs/day, 5 days/week) and OT (1-2 hrs/day, 5 days/week) to address physical and functional deficits in the context  of the above medical diagnosis(es)?  Yes Addressing deficits in the following areas: balance, endurance, locomotion, strength, transferring, bathing, dressing and toileting 5. Can the patient actively participate in an intensive therapy program of at least 3 hrs of therapy per day at least 5 days per week? Yes 6. The potential for patient to make measurable gains while on inpatient rehab is good 7. Anticipated functional outcomes upon discharge from inpatient rehab are modified independent  with PT, modified independent with OT, n/a with SLP. 8. Estimated rehab length of stay to reach the above functional goals is: 10-12 days 9. Does the patient have adequate social supports to accommodate these discharge functional goals? Yes 10. Anticipated D/C setting: Home 11. Anticipated post D/C treatments: [place] therapy 12. Overall Rehab/Functional Prognosis: excellent  RECOMMENDATIONS: This patient's condition is appropriate for continued rehabilitative care in the following setting: CIR Patient has agreed to participate in recommended program. Yes Note that insurance prior authorization may be required for reimbursement for recommended care.  Comment:     12/24/2013

## 2013-12-24 NOTE — Evaluation (Signed)
Physical Therapy Evaluation Patient Details Name: Lisa Kemp MRN: [id number] DOB: Jan 28, 1924 Today's Date: 12/24/2013   History of Present Illness  80 y.o. admitted after decreased use of right arm and facial numbness.  PMH:  GERD, arthritis, HTN.  Head Ct at [facility] revealed a left internal capsule hemorrhage.    Clinical Impression  Patient demonstrates deficits as indicated below. Patient was independent prior to admission. Feel patient will benefit from continued skilled PT acutely and CIR upon acute discharge to maximize function and return to independence. Will continue to see as indicated and progress as tolerated.    Follow Up Recommendations CIR    Equipment Recommendations  Rolling walker with 5" wheels    Recommendations for Other Services Rehab consult     Precautions / Restrictions Precautions Precautions: Fall      Mobility  Bed Mobility Overal bed mobility: Needs Assistance Bed Mobility: Supine to Sit     Supine to sit: Supervision     General bed mobility comments: increased time to perform  Transfers Overall transfer level: Needs assistance Equipment used: Rolling walker (2 wheeled);1 person hand held assist Transfers: Sit to/from Stand Sit to Stand: Min assist         General transfer comment: Min assist for stability and to keep from losing balance posteriorly with HHA, min assist/min guard with use of RW.  VCs for safe hand placement, poor control of descent into chair  Ambulation/Gait Ambulation/Gait assistance: Min assist (occasional Moderate assist with 1 person HHA, 2 LOB noted) Ambulation Distance (Feet): 24 Feet (19ft to the chair, 48ft to toilet, 59ft to chair from toilet) Assistive device: Rolling walker (2 wheeled);1 person hand held assist Gait Pattern/deviations: Decreased stride length;Step-through pattern;Trunk flexed;Drifts right/left Gait velocity: decreased Gait velocity interpretation: <1.8 ft/sec, indicative  of risk for recurrent falls General Gait Details: patient unsteady with gait.  Attempted ambulation without device using 1 person HHA and patient was barely able to tolerate ambulation safely without moderate assistance and had 2 noted LOB.  Provided patient with RW and patient was asked to ambulate again, still required minimal assist for stability. (Patient was independent with a cane PTA).Patient also became very fatigued and had increased DOE during minimal ambulation and activity. SpO2 >95% throughout but DOE 3/4  Stairs            Wheelchair Mobility    Modified Rankin (Stroke Patients Only) Modified Rankin (Stroke Patients Only) Pre-Morbid Rankin Score: No symptoms Modified Rankin: Moderately severe disability     Balance                                             Pertinent Vitals/Pain No pain reproted at this time, DOE with activity 3/4 with SpO2 >95% throughout on room air    Home Living Family/patient expects to be discharged to:: Private residence Living Arrangements: Alone Available Help at Discharge: Family (grandson lives "across the driveway") Type of Home: House Home Access: Stairs to enter Entrance Stairs-Rails: None Technical brewer of Steps: 1 Home Layout: One level Home Equipment: Walker - standard;Cane - single point      Prior Function Level of Independence: Independent               Hand Dominance   Dominant Hand: Right    Extremity/Trunk Assessment   Upper Extremity Assessment: RUE deficits/detail RUE Deficits / Details: asymetrical  weakness evident upon testing         Lower Extremity Assessment: RLE deficits/detail RLE Deficits / Details: RLE strength deficits asymetrical upon testing, 3+/5 all motions       Communication      Cognition Arousal/Alertness: Awake/alert Behavior During Therapy: WFL for tasks assessed/performed Overall Cognitive Status: No family/caregiver present to determine  baseline cognitive functioning Area of Impairment: Safety/judgement;Problem solving         Safety/Judgement: Decreased awareness of safety;Decreased awareness of deficits   Problem Solving: Slow processing General Comments: patient continues to state that her poor balance is related to "having milk and cereal today" and not because of the bleed she had in her brain or the weakness on her right side    General Comments General comments (skin integrity, edema, etc.): Spoke with patient extensively regarding right sided weakness still present as well as concerns for safety with mobility. Patient understanding of events (hemmorhagic CVA) is questionable as she kept stating that her balance and walking was poor because she had some "milk today and it cause her GERD to flare up).  Took increased time to try and explain to patient the weakness and deficits as seen with mobility. Explained that I did not feel patient was safe for dc home alone at this time. patient and discussed CIR and process of rehab and benefits from patient to remain independent.    Exercises        Assessment/Plan    PT Assessment Patient needs continued PT services  PT Diagnosis Difficulty walking;Abnormality of gait;Generalized weakness   PT Problem List Decreased strength;Decreased activity tolerance;Decreased balance;Decreased mobility;Decreased safety awareness  PT Treatment Interventions DME instruction;Gait training;Stair training;Functional mobility training;Therapeutic activities;Therapeutic exercise;Balance training;Patient/family education   PT Goals (Current goals can be found in the Care Plan section) Acute Rehab PT Goals Patient Stated Goal: to go home PT Goal Formulation: With patient Time For Goal Achievement: 01/07/14 Potential to Achieve Goals: Good    Frequency Min 4X/week   Barriers to discharge        Co-evaluation               End of Session Equipment Utilized During Treatment:  Gait belt Activity Tolerance: Patient tolerated treatment well;Patient limited by fatigue Patient left: in chair;with call bell/phone within reach;with chair alarm set Nurse Communication: Mobility status         Time: 2725-3664 PT Time Calculation (min): 41 min   Charges:   PT Evaluation $Initial PT Evaluation Tier I: 1 Procedure PT Treatments $Gait Training: 8-22 mins $Therapeutic Activity: 8-22 mins $Self Care/Home Management: 8-22   PT G Codes:          Duncan Dull 12/24/2013, 12:19 PM Alben Deeds, [place] DPT  [phone]

## 2013-12-24 NOTE — Progress Notes (Signed)
Called report to RN on 4N.  Patient will be transferred via wheelchair, on tele, by RN and NT. Virgel Paling

## 2013-12-24 NOTE — Progress Notes (Signed)
Occupational Therapy Evaluation Patient Details Name: Lisa Kemp MRN: [id number] DOB: August 24, 1924 Today's Date: 12/24/2013    History of Present Illness 80 y.o. admitted after decreased use of right arm and facial numbness.  PMH:  GERD, arthritis, HTN.  Head Ct at [facility] revealed a left internal capsule hemorrhage.     Clinical Impression   PTA, pt lived alone, worked every other week and was independent with ADL and mobility. Pt presents with primarily RUE weakness and balance deficits. Making good progress and feel pt is an excellent CIR candidate. Feel pt can achieve Mod I level to D/C home with intermittent supervision. Very motivated to return to PLOF. Pt will benefit from skilled OT services to facilitate D/C to CIR due to below deficits.    Follow Up Recommendations  CIR;Supervision/Assistance - 24 hour    Equipment Recommendations  3 in 1 bedside comode    Recommendations for Other Services Rehab consult     Precautions / Restrictions Precautions Precautions: Fall      Mobility Bed Mobility Overal bed mobility: Needs Assistance Bed Mobility: Supine to Sit;Sit to Supine     Supine to sit: Supervision Sit to supine: Supervision   General bed mobility comments: increased time to perform  Transfers Overall transfer level: Needs assistance Equipment used: Rolling walker (2 wheeled);1 person hand held assist Transfers: Sit to/from [facility] Sit to Stand: Min assist Stand pivot transfers: Min assist       General transfer comment: Demonstrting progress form this am session with PT. Pt states she has never used a RW before.    Balance Overall balance assessment: Needs assistance Sitting-balance support: Feet supported;Bilateral upper extremity supported Sitting balance-Leahy Scale: Fair     Standing balance support: Bilateral upper extremity supported;During functional activity Standing balance-Leahy Scale: Poor                              ADL Overall ADL's : Needs assistance/impaired Eating/Feeding: Set up   Grooming: Set up   Upper Body Bathing: Minimal assitance;Sitting   Lower Body Bathing: Moderate assistance;Sit to/from stand   Upper Body Dressing : Minimal assistance;Sitting   Lower Body Dressing: Moderate assistance;Sit to/from stand   Toilet Transfer: Minimal assistance;Ambulation Toilet Transfer Details (indicate cue type and reason): with RW Toileting- Clothing Manipulation and Hygiene: Min guard;Sit to/from stand       Functional mobility during ADLs: Minimal assistance;Rolling walker General ADL Comments: Pt using RUE for hygiene after toileting. Decreased balance noted but pt with good insight into need to be careful     Vision                     Perception     Praxis      Pertinent Vitals/Pain C/o nausea - nsg notified     Hand Dominance Right   Extremity/Trunk Assessment Upper Extremity Assessment Upper Extremity Assessment: RUE deficits/detail RUE Deficits / Details: greater weakness proximally. Able to complete @70  shoulder FF. Using RUE functionally with minimal difficulty (mild limb ataxia noted) RUE Coordination: decreased fine motor;decreased gross motor   Lower Extremity Assessment Lower Extremity Assessment: Defer to PT evaluation RLE Deficits / Details: RLE strength deficits asymetrical upon testing, 3+/5 all motions RLE Sensation: history of peripheral neuropathy   Cervical / Trunk Assessment Cervical / Trunk Assessment: Kyphotic;Other exceptions (posterior sway at times. Apparent minimal trunkal ataxia)   Communication Communication Communication: No difficulties   Cognition  Arousal/Alertness: Awake/alert Behavior During Therapy: WFL for tasks assessed/performed Overall Cognitive Status: No family/caregiver present to determine baseline cognitive functioning Appears to be intact - will further assess         General Comments:  Will further assess. States that she just had her brain bleed Tuesday and her balance is off   General Comments       Exercises       Shoulder Instructions      Home Living Family/patient expects to be discharged to:: Private residence Living Arrangements: Alone Available Help at Discharge: Family (grandson lives "across the driveway") Type of Home: House Home Access: Stairs to enter Technical brewer of Steps: 1 Entrance Stairs-Rails: None Home Layout: One level     Bathroom Shower/Tub: Tub/shower unit Shower/tub characteristics: Architectural technologist: Standard Bathroom Accessibility: Yes How Accessible: Accessible via walker Home Equipment: Environmental consultant - standard;Cane - single point      Lives With: Alone    Prior Functioning/Environment Level of Independence: Independent        Comments: 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    OT Diagnosis: Generalized weakness;Ataxia   OT Problem List: Decreased strength;Decreased range of motion;Decreased activity tolerance;Impaired balance (sitting and/or standing);Decreased coordination;Impaired UE functional use   OT Treatment/Interventions: Self-care/ADL training;Therapeutic exercise;Neuromuscular education;DME and/or AE instruction;Therapeutic activities;Patient/family education;Balance training    OT Goals(Current goals can be found in the care plan section) Acute Rehab OT Goals Patient Stated Goal: to go home OT Goal Formulation: With patient Time For Goal Achievement: 01/07/14 Potential to Achieve Goals: Good  OT Frequency: Min 3X/week   Barriers to D/C: Decreased caregiver support;Other (comment) (Family works during the day but can check on her at night)          Co-evaluation              End of Session Equipment Utilized During Treatment: Administrator, arts Communication: Mobility status;Other (comment) (request for nausea meds)  Activity Tolerance: Patient tolerated treatment  well Patient left: in bed;with call bell/phone within reach;with bed alarm set   Time: 1610-9604 OT Time Calculation (min): 27 min Charges:  OT General Charges $OT Visit: 1 Procedure OT Evaluation $Initial OT Evaluation Tier I: 1 Procedure OT Treatments $Self Care/Home Management : 8-22 mins G-Codes:    Roney Jaffe Atonya Templer 2014/01/08, 2:22 PM   [facility], OTR/L  [phone] 2014-01-08

## 2013-12-24 NOTE — Progress Notes (Signed)
Rehab Admissions Coordinator Note:  Patient was screened by Retta Diones for appropriateness for an Inpatient Acute Rehab Consult.  At this time, we are recommending Inpatient Rehab consult.  Lisa Kemp 12/24/2013, 1:41 PM  I can be reached at [phone].

## 2013-12-24 NOTE — Progress Notes (Signed)
Stroke Team Progress Note  HISTORY Lisa Kemp is an 80 y.o. female who was at her baseline today 12/22/2013. Was able to go to work and after returning home was taking her blood pressure and noted that the cuff was abnormally tight. She tried to switch the cuff to the left and noted that she could not use her right arm well. She then noted that the right side of her face was numb. When she attempted to get up and alert her grandson she noted that her right leg would not support her. EMS was called and the patient was brought to [facility]. last known well 12/22/2013 at 1700. Patient was not administered TPA secondary to hemorrhage. She was admitted to the neuro ICU for further evaluation and treatment.  SUBJECTIVE No family at the bedside. Patient lived alone prior to admission. 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  OBJECTIVE Most recent Vital Signs: Filed Vitals:   12/24/13 0500 12/24/13 0600 12/24/13 0700 12/24/13 0801  BP: 134/79 114/59 141/51   Pulse: 75 70 66   Temp:    97.8 F (36.6 C)  TempSrc:    Oral  Resp: 20 20 15    Height:      Weight:      SpO2: 96% 96% 95%    CBG (last 3)  No results found for this basename: GLUCAP,  in the last 72 hours  IV Fluid Intake:     MEDICATIONS  . famotidine  20 mg Oral Daily  . levothyroxine  100 mcg Oral QAC breakfast  . LORazepam  2 mg Oral QHS  . pantoprazole (PROTONIX) IV  40 mg Intravenous QHS  . senna-docusate  1 tablet Oral BID   PRN:  acetaminophen, acetaminophen, labetalol  Diet:  Cardiac thin liquids Activity:  Bedrest DVT Prophylaxis:  SCDs   CLINICALLY SIGNIFICANT STUDIES Basic Metabolic Panel: No results found for this basename: NA, K, CL, CO2, GLUCOSE, BUN, CREATININE, CALCIUM, MG, PHOS,  in the last 168 hours Liver Function Tests: No results found for this basename: AST, ALT, ALKPHOS, BILITOT, PROT, ALBUMIN,  in the last 168 hours CBC: No results found for this basename: WBC, NEUTROABS, HGB, HCT, MCV, PLT,   in the last 168 hours Coagulation: No results found for this basename: LABPROT, INR,  in the last 168 hours Cardiac Enzymes: No results found for this basename: CKTOTAL, CKMB, CKMBINDEX, TROPONINI,  in the last 168 hours Urinalysis: No results found for this basename: COLORURINE, APPERANCEUR, LABSPEC, PHURINE, GLUCOSEU, HGBUR, BILIRUBINUR, KETONESUR, PROTEINUR, UROBILINOGEN, NITRITE, LEUKOCYTESUR,  in the last 168 hours Lipid Panel    Component Value Date/Time   CHOL 157 12/23/2013 0228   TRIG 188* 12/23/2013 0228   HDL 35* 12/23/2013 0228   CHOLHDL 4.5 12/23/2013 0228   VLDL 38 12/23/2013 0228   LDLCALC 84 12/23/2013 0228   HgbA1C  Lab Results  Component Value Date   HGBA1C 5.5 12/23/2013    Urine Drug Screen:   No results found for this basename: labopia,  cocainscrnur,  labbenz,  amphetmu,  thcu,  labbarb    Alcohol Level: No results found for this basename: ETH,  in the last 168 hours   CT of the brain   12/24/2013    1. 1.2 x 0.7 cm focal intraparenchymal hemorrhage at the left posterior basal ganglia is relatively stable in size from recent prior MRI, though mildly increased in size from prior CT. Mild associated vasogenic edema again seen. No evidence of midline shift. The location remains most compatible  with a hypertensive bleed. 2. No evidence of acute infarct. 3. Scattered small vessel ischemic microangiopathy.    12/22/2013 ([place]) 2x1x1 L CR hemorhage  MRI of the brain  12/23/2013    1. Solitary small subacute hemorrhage in the left hemisphere white matter is slightly increased in size since 12/22/2013 (estimated volume 1-2 mL). Mild edema and no significant mass effect.   MRA of the brain  12/23/2013   2. Negative for age intracranial MRA.     2D Echocardiogram EF 65-70% with no source of embolus. Image quality limits evaluation of the mitral valve. Cannot exclude systolic anterior motion of the anterior leaflet as the underlying mechansm for MR.  Therapy Recommendations    Physical Exam   Pleasant elderly Caucasian lady not in distress.Awake alert. Afebrile. Head is nontraumatic. Neck is supple without bruit. Hearing is normal. Cardiac exam no murmur or gallop. Lungs are clear to auscultation. Distal pulses are well felt. Neurological Exam :  Awake alert oriented x3 normal speech and language. Extraocular movements are full range without nystagmus. Blinks to threat bilaterally. Fundi were not visualized. Vision acuity seems adequate. Mild right lower facial weakness. Tongue midline. Motor system exam revealed no upper or lower extremity drift. Mild weakness of right grip and intrinsic hand muscles. Orbits left-to-right upper extremity. Minimum right hip exam ankle dorsiflexor weakness. Sensation appears to be preserved bilaterally. Finger-to-nose and knee to heel coordination appears accurate. Gait was not tested.  ASSESSMENT [name] is a 80 y.o. female presenting with right sided weakness. Imaging confirms a left corona radiata hemorrhage. Hemorrhage felt to be secondary to accelerated hypertension.  On aspirin 81 mg orally every day prior to admission. Stroke work up underway.  Accelerated hypertension 174/105 on arrival to [facility] day # 2  TREATMENT/PLAN  Transfer to the floor  Increase SBP goal to < 180  OOB, therapy evals  Resume aspirin in 1-2 weeks  Burnetta Sabin, MSN, RN, ANVP-BC, AGPCNP-BC [facility] Pager: [phone] 12/24/2013 9:00 AM  I have personally obtained a history, examined the patient, evaluated imaging results, and formulated the assessment and plan of care. I agree with the above.  Antony Contras, MD   To contact Stroke Continuity provider, please refer to [email or web address] After hours, contact General Neurology

## 2013-12-25 ENCOUNTER — Inpatient Hospital Stay (HOSPITAL_COMMUNITY)
Admission: RE | Admit: 2013-12-25 | Discharge: 2014-01-01 | DRG: 945 | Disposition: A | Discharge status: Home Health | Payer: Medicare Other | Source: Intra-hospital | Attending: Physical Medicine & Rehabilitation | Admitting: Physical Medicine & Rehabilitation

## 2013-12-25 ENCOUNTER — Inpatient Hospital Stay (HOSPITAL_COMMUNITY)
Admission: RE | Admit: 2013-12-25 | Payer: Medicare Other | Source: Intra-hospital | Admitting: Physical Medicine & Rehabilitation

## 2013-12-25 ENCOUNTER — Encounter: Payer: Self-pay | Admitting: Cardiology

## 2013-12-25 ENCOUNTER — Encounter (HOSPITAL_COMMUNITY): Payer: Self-pay

## 2013-12-25 DIAGNOSIS — S065XAA Traumatic subdural hemorrhage with loss of consciousness status unknown, initial encounter: Secondary | ICD-10-CM | POA: Diagnosis present

## 2013-12-25 DIAGNOSIS — S065X9A Traumatic subdural hemorrhage with loss of consciousness of unspecified duration, initial encounter: Secondary | ICD-10-CM | POA: Diagnosis present

## 2013-12-25 DIAGNOSIS — G5790 Unspecified mononeuropathy of unspecified lower limb: Secondary | ICD-10-CM | POA: Diagnosis present

## 2013-12-25 DIAGNOSIS — K219 Gastro-esophageal reflux disease without esophagitis: Secondary | ICD-10-CM | POA: Diagnosis present

## 2013-12-25 DIAGNOSIS — Z79899 Other long term (current) drug therapy: Secondary | ICD-10-CM

## 2013-12-25 DIAGNOSIS — Z602 Problems related to living alone: Secondary | ICD-10-CM

## 2013-12-25 DIAGNOSIS — I619 Nontraumatic intracerebral hemorrhage, unspecified: Secondary | ICD-10-CM | POA: Diagnosis present

## 2013-12-25 DIAGNOSIS — R4587 Impulsiveness: Secondary | ICD-10-CM | POA: Diagnosis not present

## 2013-12-25 DIAGNOSIS — F411 Generalized anxiety disorder: Secondary | ICD-10-CM | POA: Diagnosis present

## 2013-12-25 DIAGNOSIS — R454 Irritability and anger: Secondary | ICD-10-CM | POA: Diagnosis not present

## 2013-12-25 DIAGNOSIS — E039 Hypothyroidism, unspecified: Secondary | ICD-10-CM | POA: Diagnosis present

## 2013-12-25 DIAGNOSIS — I10 Essential (primary) hypertension: Secondary | ICD-10-CM | POA: Diagnosis present

## 2013-12-25 DIAGNOSIS — G8190 Hemiplegia, unspecified affecting unspecified side: Secondary | ICD-10-CM | POA: Diagnosis present

## 2013-12-25 DIAGNOSIS — Z5189 Encounter for other specified aftercare: Principal | ICD-10-CM

## 2013-12-25 MED ORDER — ONDANSETRON HCL 4 MG/2ML IJ SOLN
4.0000 mg | Freq: Four times a day (QID) | INTRAMUSCULAR | Status: DC | PRN
Start: 2013-12-25 — End: 2014-01-01

## 2013-12-25 MED ORDER — SENNOSIDES-DOCUSATE SODIUM 8.6-50 MG PO TABS
1.0000 | ORAL_TABLET | Freq: Two times a day (BID) | ORAL | Status: DC
  Administered 2013-12-30 – 2013-12-31 (×2): 1 via ORAL
  Filled 2013-12-25 (×15): qty 1

## 2013-12-25 MED ORDER — FERROUS SULFATE 325 (65 FE) MG PO TABS
325.0000 mg | ORAL_TABLET | Freq: Every day | ORAL | Status: DC
  Administered 2013-12-26: 325 mg via ORAL
  Filled 2013-12-25 (×4): qty 1

## 2013-12-25 MED ORDER — FUROSEMIDE 20 MG PO TABS
20.0000 mg | ORAL_TABLET | Freq: Every day | ORAL | Status: DC
  Administered 2013-12-26 – 2014-01-01 (×7): 20 mg via ORAL
  Filled 2013-12-25 (×8): qty 1

## 2013-12-25 MED ORDER — FAMOTIDINE 20 MG PO TABS
20.0000 mg | ORAL_TABLET | Freq: Every day | ORAL | Status: DC
  Administered 2013-12-26 – 2014-01-01 (×7): 20 mg via ORAL
  Filled 2013-12-25 (×8): qty 1

## 2013-12-25 MED ORDER — POTASSIUM CHLORIDE ER 10 MEQ PO TBCR
10.0000 meq | EXTENDED_RELEASE_TABLET | Freq: Every day | ORAL | Status: DC
  Administered 2013-12-26 – 2014-01-01 (×7): 10 meq via ORAL
  Filled 2013-12-25 (×8): qty 1

## 2013-12-25 MED ORDER — LORAZEPAM 0.5 MG PO TABS
2.0000 mg | ORAL_TABLET | Freq: Every day | ORAL | Status: DC
  Administered 2013-12-25 – 2013-12-31 (×7): 2 mg via ORAL
  Filled 2013-12-25 (×7): qty 4

## 2013-12-25 MED ORDER — ONDANSETRON HCL 4 MG PO TABS: 4.0000 mg | ORAL_TABLET | Freq: Four times a day (QID) | ORAL | Status: DC | PRN

## 2013-12-25 MED ORDER — ACETAMINOPHEN 650 MG RE SUPP: 650.0000 mg | RECTAL | Status: DC | PRN

## 2013-12-25 MED ORDER — SORBITOL 70 % SOLN: 30.0000 mL | Freq: Every day | Status: DC | PRN

## 2013-12-25 MED ORDER — ACETAMINOPHEN 325 MG PO TABS
650.0000 mg | ORAL_TABLET | ORAL | Status: DC | PRN
  Administered 2013-12-30 (×2): 650 mg via ORAL
  Filled 2013-12-25 (×2): qty 2

## 2013-12-25 MED ORDER — LEVOTHYROXINE SODIUM 100 MCG PO TABS
100.0000 ug | ORAL_TABLET | Freq: Every day | ORAL | Status: DC
  Administered 2013-12-26 – 2014-01-01 (×7): 100 ug via ORAL
  Filled 2013-12-25 (×8): qty 1

## 2013-12-25 MED ORDER — ATENOLOL 25 MG PO TABS
25.0000 mg | ORAL_TABLET | Freq: Two times a day (BID) | ORAL | Status: DC
  Administered 2013-12-25 – 2014-01-01 (×14): 25 mg via ORAL
  Filled 2013-12-25 (×16): qty 1

## 2013-12-25 MED ORDER — GABAPENTIN 100 MG PO CAPS
100.0000 mg | ORAL_CAPSULE | Freq: Every day | ORAL | Status: DC
  Administered 2013-12-25 – 2013-12-31 (×7): 100 mg via ORAL
  Filled 2013-12-25 (×8): qty 1

## 2013-12-25 NOTE — H&P (View-Only) (Signed)
Physical Medicine and Rehabilitation Admission H&P    No chief complaint on file. :  Chief complaint; weakness  HPI: Lisa Kemp is a 80 y.o. right-handed female with history of hypertension. Patient was independent prior to admission living alone and still working part time. Admitted 12/22/2013 with right-sided weakness upon transfer from [facility]. Cranial CT scan at outside hospital showed left internal capsule hemorrhage 7 x 9 mm. A followup Cranial CT scan showed a 1.2 x 0.7 cm focal intraparenchymal hemorrhage at the left posterior basal ganglia did MRI showed small subacute hemorrhage left hemisphere white matter slightly increased in size since prior study of 12/22/2013 with mild edema without significant mass effect. MRA of the brain negative. Echocardiogram ejection fraction of 70% no wall motion abnormalities. Neurology service followup advise conservative care. Noted accelerated hypertension 174/105 on admission and monitored. Patient had been on aspirin prior to admission advise to hold and resume in one to 2 weeks. Physical therapy evaluation completed 12/24/2013 with recommendations for physical medicine rehabilitation consult. Admitted for comprehensive rehabilitation program   ROS Review of Systems  Gastrointestinal:  GERD  Musculoskeletal: Positive for joint pain and myalgias.  Psychiatric/Behavioral:  Anxiety  All other systems reviewed and are negative  Past Medical History  Diagnosis Date  . GERD (gastroesophageal reflux disease)   . Arthritis   . Hypothyroidism   . Hypertension    Past Surgical History  Procedure Laterality Date  . Colon surgery     History reviewed. No pertinent family history. Social History:  reports that she has never smoked. She does not have any smokeless tobacco history on file. She reports that she does not drink alcohol or use illicit drugs. Allergies:  Allergies  Allergen Reactions  . Diazepam Other (See  Comments)    Could not walk   Medications Prior to Admission  Medication Sig Dispense Refill  . aspirin 81 MG tablet Take 81 mg by mouth daily.      [name] atenolol (TENORMIN) 25 MG tablet Take 25 mg by mouth 2 (two) times daily.      . ferrous sulfate 325 (65 FE) MG tablet Take 325 mg by mouth daily with breakfast.      . furosemide (LASIX) 20 MG tablet Take 20 mg by mouth daily.      [name] gabapentin (NEURONTIN) 100 MG capsule Take 100 mg by mouth at bedtime.      [name] levothyroxine (SYNTHROID, LEVOTHROID) 112 MCG tablet Take 112 mcg by mouth daily before breakfast.      . LORazepam (ATIVAN) 2 MG tablet Take 2 mg by mouth every 6 (six) hours as needed for anxiety.      . Magnesium 500 MG CAPS Take 500 mg by mouth every evening.      . potassium chloride (K-DUR) 10 MEQ tablet Take 10 mEq by mouth daily.      . ranitidine (ZANTAC) 150 MG tablet Take 150 mg by mouth 2 (two) times daily.        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 Pt states she has never used a RW before. Ambulation/Gait Ambulation/Gait assistance: Min assist (occasional Moderate assist with 1 person HHA, 2 LOB noted) Ambulation Distance (Feet): 24 Feet (57ft to the chair, 79ft to toilet, 31ft to chair from toilet) Assistive device: Rolling walker (2 wheeled);1 person hand held assist Gait Pattern/deviations: Decreased stride length;Step-through pattern;Trunk flexed;Drifts right/left Gait velocity: decreased Gait velocity interpretation: <1.8 ft/sec, indicative of risk for recurrent falls General Gait Details: patient unsteady with gait.  Attempted ambulation without device using 1 person HHA and patient was barely able to tolerate ambulation safely without moderate assistance and had 2 noted LOB.  Provided patient with RW and patient was asked to ambulate again, still required minimal assist for stability. (Patient was independent with a cane PTA).Patient also became very fatigued and had increased DOE during minimal ambulation and activity. SpO2 >95% throughout but DOE 3/4    ADL: min to mod assist with basic self-care    Cognition: Cognition Overall Cognitive Status: No family/caregiver present to determine baseline cognitive functioning Arousal/Alertness: Awake/alert Orientation Level: Oriented X4 Attention: Sustained Sustained Attention: Appears intact Memory:  (overall functional, ?mild impairment) Awareness: Appears intact Problem Solving: Appears intact Safety/Judgment: Appears intact Cognition Arousal/Alertness: Awake/alert Behavior During Therapy: WFL for tasks assessed/performed Overall Cognitive Status: No family/caregiver present to determine baseline cognitive functioning Area of Impairment: Safety/judgement;Problem solving Safety/Judgement: Decreased awareness of safety;Decreased awareness of deficits Problem Solving: Slow processing General Comments: Will further  assess. States that she just had her brain bleed Tuesday and her balance is off  Physical Exam: Blood pressure 103/51, pulse 78, temperature 98.9 F (37.2 C), temperature source Oral, resp. rate 18, height 5\' 2"  (1.575 m), weight 70.2 kg (154 lb 12.2 oz), SpO2 92.00%. Physical Exam Constitutional: She is oriented to person, place, and time.  HENT: oral mucosa pink and moist Head: Normocephalic.  Eyes: EOM are normal. PERRL Neck: Normal range of motion. Neck supple. No thyromegaly present.  Cardiovascular: Normal rate and regular rhythm.  Respiratory: Effort normal and breath sounds normal. No respiratory distress.  GI: Soft. Bowel sounds are normal. She exhibits no distension.  Neurological: She is alert and oriented to person, place, and time.  Follows full commands  Skin: Skin is warm and dry.  4-/5 right deltoid, 3+ to4 right biceps triceps grip. Mild right PD.   5/5 left deltoid, bicep, tricep, grip  4/5 right hip flexor knee extensor ankle dorsiflexor and plantar flexor  5/5 left hip flexor knee extensor ankle dysfunction plantarflexion  Sensation mildly reduced to light touch in the right hand. Intact in the right lower extremity and on the left side. Dysmetria and impaired [place] RUE and RLE. No tremor. No resting tone. DTR's 1+  Musculoskeletal: valgus deformity right greater than left knee Psych: attentive, calm, appropriate  No results found for this or any previous visit (from the past 48 hour(s)). Ct Head Wo Contrast  12/24/2013   CLINICAL DATA:  Follow-up intraparenchymal hemorrhage.  EXAM: CT HEAD WITHOUT CONTRAST  TECHNIQUE: Contiguous axial images were obtained from the base of the skull through the vertex without intravenous  contrast.  COMPARISON:  CT of the head performed 12/22/2013, and MRI of the brain performed 12/23/2016  FINDINGS: The small 1.2 x 0.7 cm focal intraparenchymal hemorrhage at the left posterior basal ganglia is relatively stable in size from recent prior  MRI. Mild associated vasogenic edema is again seen. As before, this extends nearly to the left lateral ventricle, without evidence of intraventricular bleed. No significant midline shift is seen.  Mild scattered periventricular and subcortical white matter change likely reflects small vessel ischemic microangiopathy.  The posterior fossa, including the cerebellum, brainstem and fourth ventricle, is within normal limits. The third and lateral ventricles are unremarkable in appearance. The cerebral hemispheres demonstrate grossly normal gray-white differentiation.  There is no evidence of fracture; visualized osseous structures are unremarkable in appearance. The visualized portions of the orbits are within normal limits. The paranasal sinuses and mastoid air cells are well-aerated. No significant soft tissue abnormalities are seen.  IMPRESSION: 1. 1.2 x 0.7 cm focal intraparenchymal hemorrhage at the left posterior basal ganglia is relatively stable in size from recent prior MRI, though mildly increased in size from prior CT. Mild associated vasogenic edema again seen. No evidence of midline shift. The location remains most compatible with a hypertensive bleed. 2. No evidence of acute infarct. 3. Scattered small vessel ischemic microangiopathy.   Electronically Signed   By: Garald Balding M.D.   On: 12/24/2013 04:58   Mr Virgel Paling Wo Contrast  12/23/2013   CLINICAL DATA:  80 year old female with sudden onset inability to use the right upper extremity, right face numbness. Initial encounter.  EXAM: MRI HEAD WITHOUT CONTRAST  MRA HEAD WITHOUT CONTRAST  TECHNIQUE: Multiplanar, multiecho pulse sequences of the brain and surrounding structures were obtained without intravenous contrast. Angiographic images of the head were obtained using MRA technique without contrast.  COMPARISON:  [facility] CT without contrast 12/22/2013.  FINDINGS: MRI HEAD FINDINGS  Susceptibility artifact and signal  abnormality associated with blood products at the left corona radiata tracking toward the posterior limb of the left internal capsule. This corresponds to the intracranial hemorrhage identified on 12/22/2013. The area of signal abnormality has mildly increased since that time, now up to 13 mm diameter (previously 9 mm). The blood products are T2 hyperintense and T1 hypo to isointense. Mild surrounding edema. No significant mass effect.  No larger area of underlying restricted diffusion. No other acute or chronic intracranial hemorrhage identified. Major intracranial vascular flow voids are preserved.  Additional scattered cerebral white matter T2 and FLAIR hyperintensity in a nonspecific pattern. No cortical encephalomalacia identified. No ventriculomegaly. Negative basilar cisterns. Negative pituitary, cervicomedullary junction and visualized cervical spine. Normal bone marrow signal. Postoperative changes to the globes. Visualized paranasal sinuses and mastoids are clear. Visible internal auditory structures appear normal. Visualized scalp soft tissues are within normal limits.  MRA HEAD FINDINGS  Study is mildly degraded by motion artifact despite repeated imaging attempts.  Antegrade flow in the posterior circulation with mildly dominant distal right vertebral artery. Patent PICA origins. Patent vertebrobasilar junction. No basilar stenosis. SCA and PCA origins are normal. Posterior communicating arteries are present. Bilateral PCA branches are within normal limits.  Antegrade flow in both ICA siphons. No ICA stenosis. Ophthalmic and posterior communicating artery origins are normal. Normal carotid termini, MCA and ACA origins.  Mild irregularity and stenosis of the left ACA A1 segment. Anterior communicating artery within normal limits. Azygos proximal ACA configuration incidentally noted. Bilateral ACA branches within normal limits. Visualized bilateral MCA branches are  patent with mild irregularity. M1  segments are within normal limits (incidental early branching on the right).  IMPRESSION: 1. Solitary small subacute hemorrhage in the left hemisphere white matter is slightly increased in size since 12/22/2013 (estimated volume 1-2 mL). Mild edema and no significant mass effect. 2. Negative for age intracranial MRA.   Electronically Signed   By: Lars Pinks M.D.   On: 12/23/2013 13:18   Mr Brain Wo Contrast  12/23/2013   CLINICAL DATA:  80 year old female with sudden onset inability to use the right upper extremity, right face numbness. Initial encounter.  EXAM: MRI HEAD WITHOUT CONTRAST  MRA HEAD WITHOUT CONTRAST  TECHNIQUE: Multiplanar, multiecho pulse sequences of the brain and surrounding structures were obtained without intravenous contrast. Angiographic images of the head were obtained using MRA technique without contrast.  COMPARISON:  [facility] CT without contrast 12/22/2013.  FINDINGS: MRI HEAD FINDINGS  Susceptibility artifact and signal abnormality associated with blood products at the left corona radiata tracking toward the posterior limb of the left internal capsule. This corresponds to the intracranial hemorrhage identified on 12/22/2013. The area of signal abnormality has mildly increased since that time, now up to 13 mm diameter (previously 9 mm). The blood products are T2 hyperintense and T1 hypo to isointense. Mild surrounding edema. No significant mass effect.  No larger area of underlying restricted diffusion. No other acute or chronic intracranial hemorrhage identified. Major intracranial vascular flow voids are preserved.  Additional scattered cerebral white matter T2 and FLAIR hyperintensity in a nonspecific pattern. No cortical encephalomalacia identified. No ventriculomegaly. Negative basilar cisterns. Negative pituitary, cervicomedullary junction and visualized cervical spine. Normal bone marrow signal. Postoperative changes to the globes. Visualized paranasal  sinuses and mastoids are clear. Visible internal auditory structures appear normal. Visualized scalp soft tissues are within normal limits.  MRA HEAD FINDINGS  Study is mildly degraded by motion artifact despite repeated imaging attempts.  Antegrade flow in the posterior circulation with mildly dominant distal right vertebral artery. Patent PICA origins. Patent vertebrobasilar junction. No basilar stenosis. SCA and PCA origins are normal. Posterior communicating arteries are present. Bilateral PCA branches are within normal limits.  Antegrade flow in both ICA siphons. No ICA stenosis. Ophthalmic and posterior communicating artery origins are normal. Normal carotid termini, MCA and ACA origins.  Mild irregularity and stenosis of the left ACA A1 segment. Anterior communicating artery within normal limits. Azygos proximal ACA configuration incidentally noted. Bilateral ACA branches within normal limits. Visualized bilateral MCA branches are patent with mild irregularity. M1 segments are within normal limits (incidental early branching on the right).  IMPRESSION: 1. Solitary small subacute hemorrhage in the left hemisphere white matter is slightly increased in size since 12/22/2013 (estimated volume 1-2 mL). Mild edema and no significant mass effect. 2. Negative for age intracranial MRA.   Electronically Signed   By: Lars Pinks M.D.   On: 12/23/2013 13:18       Medical Problem List and Plan:  1. Functional deficits secondary to : Left subcortical intracranial hemorrhage with right hemiparesis. Patient on aspirin prior to admission 81 mg daily plan to resume in one to 2 weeks per neurology services 2.  DVT Prophylaxis/Anticoagulation: SCDs. Monitor for any signs of DVT 3. Pain Management: Neurontin 100 mg each bedtime. Monitor with increased mobility 4. Mood/anxiety: Ativan 2 mg each bedtime. 5. Neuropsych: This patient is capable of making decisions on her own behalf. 6. Hypertension. Tenormin 25 mg twice a  day, Lasix 20 mg daily. Monitor  with increased mobility 7. Hypothyroidism. Synthroid 8. GERD. Pepcid. No nausea reported   Post Admission Physician Evaluation: 1. Functional deficits secondary  to left subcortical intracranial hemorrhage with right hemiparesis. 2. Patient is admitted to receive collaborative, interdisciplinary care between the physiatrist, rehab nursing staff, and therapy team. 3. Patient's level of medical complexity and substantial therapy needs in context of that medical necessity cannot be provided at a lesser intensity of care such as a SNF. 4. Patient has experienced substantial functional loss from his/her baseline which was documented above under the "Functional History" and "Functional Status" headings.  Judging by the patient's diagnosis, physical exam, and functional history, the patient has potential for functional progress which will result in measurable gains while on inpatient rehab.  These gains will be of substantial and practical use upon discharge  in facilitating mobility and self-care at the household level. 5. Physiatrist will provide 24 hour management of medical needs as well as oversight of the therapy plan/treatment and provide guidance as appropriate regarding the interaction of the two. 6. 24 hour rehab nursing will assist with bladder management, bowel management, safety, skin/wound care, disease management, medication administration, pain management, patient education and egosupport  and help integrate therapy concepts, techniques,education, etc. 7. PT will assess and treat for/with: Lower extremity strength, range of motion, stamina, balance, functional mobility, safety, adaptive techniques and equipment, NMR, anxiety mgt, stroke education.   Goals are: mod I . 8. OT will assess and treat for/with: ADL's, functional mobility, safety, upper extremity strength, adaptive techniques and equipment, NMR, stroke education, egosupport.   Goals are: mod I. 9. SLP  will assess and treat for/with: n/a.  Goals are: n/a. 10. Case Management and Social Worker will assess and treat for psychological issues and discharge planning. 11. Team conference will be held weekly to assess progress toward goals and to determine barriers to discharge. 12. Patient will receive at least 3 hours of therapy per day at least 5 days per week. 13. ELOS: 9-12 days. Pt lives alone and needs to reach mod I goals.    14. Prognosis:  excellent.     Meredith Staggers, MD, [place]   12/25/2013

## 2013-12-25 NOTE — H&P (Signed)
Physical Medicine and Rehabilitation Admission H&P    No chief complaint on file. :  Chief complaint; weakness  HPI: Lisa Kemp is a 80 y.o. right-handed female with history of hypertension. Patient was independent prior to admission living alone and still working part time. Admitted 12/22/2013 with right-sided weakness upon transfer from [facility]. Cranial CT scan at outside hospital showed left internal capsule hemorrhage 7 x 9 mm. A followup Cranial CT scan showed a 1.2 x 0.7 cm focal intraparenchymal hemorrhage at the left posterior basal ganglia did MRI showed small subacute hemorrhage left hemisphere white matter slightly increased in size since prior study of 12/22/2013 with mild edema without significant mass effect. MRA of the brain negative. Echocardiogram ejection fraction of 70% no wall motion abnormalities. Neurology service followup advise conservative care. Noted accelerated hypertension 174/105 on admission and monitored. Patient had been on aspirin prior to admission advise to hold and resume in one to 2 weeks. Physical therapy evaluation completed 12/24/2013 with recommendations for physical medicine rehabilitation consult. Admitted for comprehensive rehabilitation program   ROS Review of Systems  Gastrointestinal:  GERD  Musculoskeletal: Positive for joint pain and myalgias.  Psychiatric/Behavioral:  Anxiety  All other systems reviewed and are negative  Past Medical History  Diagnosis Date  . GERD (gastroesophageal reflux disease)   . Arthritis   . Hypothyroidism   . Hypertension    Past Surgical History  Procedure Laterality Date  . Colon surgery     History reviewed. No pertinent family history. Social History:  reports that she has never smoked. She does not have any smokeless tobacco history on file. She reports that she does not drink alcohol or use illicit drugs. Allergies:  Allergies  Allergen Reactions  . Diazepam Other (See  Comments)    Could not walk   Medications Prior to Admission  Medication Sig Dispense Refill  . aspirin 81 MG tablet Take 81 mg by mouth daily.      [name] atenolol (TENORMIN) 25 MG tablet Take 25 mg by mouth 2 (two) times daily.      . ferrous sulfate 325 (65 FE) MG tablet Take 325 mg by mouth daily with breakfast.      . furosemide (LASIX) 20 MG tablet Take 20 mg by mouth daily.      [name] gabapentin (NEURONTIN) 100 MG capsule Take 100 mg by mouth at bedtime.      [name] levothyroxine (SYNTHROID, LEVOTHROID) 112 MCG tablet Take 112 mcg by mouth daily before breakfast.      . LORazepam (ATIVAN) 2 MG tablet Take 2 mg by mouth every 6 (six) hours as needed for anxiety.      . Magnesium 500 MG CAPS Take 500 mg by mouth every evening.      . potassium chloride (K-DUR) 10 MEQ tablet Take 10 mEq by mouth daily.      . ranitidine (ZANTAC) 150 MG tablet Take 150 mg by mouth 2 (two) times daily.        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 Pt states she has never used a RW before. Ambulation/Gait Ambulation/Gait assistance: Min assist (occasional Moderate assist with 1 person HHA, 2 LOB noted) Ambulation Distance (Feet): 24 Feet (57ft to the chair, 79ft to toilet, 31ft to chair from toilet) Assistive device: Rolling walker (2 wheeled);1 person hand held assist Gait Pattern/deviations: Decreased stride length;Step-through pattern;Trunk flexed;Drifts right/left Gait velocity: decreased Gait velocity interpretation: <1.8 ft/sec, indicative of risk for recurrent falls General Gait Details: patient unsteady with gait.  Attempted ambulation without device using 1 person HHA and patient was barely able to tolerate ambulation safely without moderate assistance and had 2 noted LOB.  Provided patient with RW and patient was asked to ambulate again, still required minimal assist for stability. (Patient was independent with a cane PTA).Patient also became very fatigued and had increased DOE during minimal ambulation and activity. SpO2 >95% throughout but DOE 3/4    ADL: min to mod assist with basic self-care    Cognition: Cognition Overall Cognitive Status: No family/caregiver present to determine baseline cognitive functioning Arousal/Alertness: Awake/alert Orientation Level: Oriented X4 Attention: Sustained Sustained Attention: Appears intact Memory:  (overall functional, ?mild impairment) Awareness: Appears intact Problem Solving: Appears intact Safety/Judgment: Appears intact Cognition Arousal/Alertness: Awake/alert Behavior During Therapy: WFL for tasks assessed/performed Overall Cognitive Status: No family/caregiver present to determine baseline cognitive functioning Area of Impairment: Safety/judgement;Problem solving Safety/Judgement: Decreased awareness of safety;Decreased awareness of deficits Problem Solving: Slow processing General Comments: Will further  assess. States that she just had her brain bleed Tuesday and her balance is off  Physical Exam: Blood pressure 103/51, pulse 78, temperature 98.9 F (37.2 C), temperature source Oral, resp. rate 18, height 5\' 2"  (1.575 m), weight 70.2 kg (154 lb 12.2 oz), SpO2 92.00%. Physical Exam Constitutional: She is oriented to person, place, and time.  HENT: oral mucosa pink and moist Head: Normocephalic.  Eyes: EOM are normal. PERRL Neck: Normal range of motion. Neck supple. No thyromegaly present.  Cardiovascular: Normal rate and regular rhythm.  Respiratory: Effort normal and breath sounds normal. No respiratory distress.  GI: Soft. Bowel sounds are normal. She exhibits no distension.  Neurological: She is alert and oriented to person, place, and time.  Follows full commands  Skin: Skin is warm and dry.  4-/5 right deltoid, 3+ to4 right biceps triceps grip. Mild right PD.   5/5 left deltoid, bicep, tricep, grip  4/5 right hip flexor knee extensor ankle dorsiflexor and plantar flexor  5/5 left hip flexor knee extensor ankle dysfunction plantarflexion  Sensation mildly reduced to light touch in the right hand. Intact in the right lower extremity and on the left side. Dysmetria and impaired [place] RUE and RLE. No tremor. No resting tone. DTR's 1+  Musculoskeletal: valgus deformity right greater than left knee Psych: attentive, calm, appropriate  No results found for this or any previous visit (from the past 48 hour(s)). Ct Head Wo Contrast  12/24/2013   CLINICAL DATA:  Follow-up intraparenchymal hemorrhage.  EXAM: CT HEAD WITHOUT CONTRAST  TECHNIQUE: Contiguous axial images were obtained from the base of the skull through the vertex without intravenous  contrast.  COMPARISON:  CT of the head performed 12/22/2013, and MRI of the brain performed 12/23/2016  FINDINGS: The small 1.2 x 0.7 cm focal intraparenchymal hemorrhage at the left posterior basal ganglia is relatively stable in size from recent prior  MRI. Mild associated vasogenic edema is again seen. As before, this extends nearly to the left lateral ventricle, without evidence of intraventricular bleed. No significant midline shift is seen.  Mild scattered periventricular and subcortical white matter change likely reflects small vessel ischemic microangiopathy.  The posterior fossa, including the cerebellum, brainstem and fourth ventricle, is within normal limits. The third and lateral ventricles are unremarkable in appearance. The cerebral hemispheres demonstrate grossly normal gray-white differentiation.  There is no evidence of fracture; visualized osseous structures are unremarkable in appearance. The visualized portions of the orbits are within normal limits. The paranasal sinuses and mastoid air cells are well-aerated. No significant soft tissue abnormalities are seen.  IMPRESSION: 1. 1.2 x 0.7 cm focal intraparenchymal hemorrhage at the left posterior basal ganglia is relatively stable in size from recent prior MRI, though mildly increased in size from prior CT. Mild associated vasogenic edema again seen. No evidence of midline shift. The location remains most compatible with a hypertensive bleed. 2. No evidence of acute infarct. 3. Scattered small vessel ischemic microangiopathy.   Electronically Signed   By: Garald Balding M.D.   On: 12/24/2013 04:58   Mr Virgel Paling Wo Contrast  12/23/2013   CLINICAL DATA:  80 year old female with sudden onset inability to use the right upper extremity, right face numbness. Initial encounter.  EXAM: MRI HEAD WITHOUT CONTRAST  MRA HEAD WITHOUT CONTRAST  TECHNIQUE: Multiplanar, multiecho pulse sequences of the brain and surrounding structures were obtained without intravenous contrast. Angiographic images of the head were obtained using MRA technique without contrast.  COMPARISON:  [facility] CT without contrast 12/22/2013.  FINDINGS: MRI HEAD FINDINGS  Susceptibility artifact and signal  abnormality associated with blood products at the left corona radiata tracking toward the posterior limb of the left internal capsule. This corresponds to the intracranial hemorrhage identified on 12/22/2013. The area of signal abnormality has mildly increased since that time, now up to 13 mm diameter (previously 9 mm). The blood products are T2 hyperintense and T1 hypo to isointense. Mild surrounding edema. No significant mass effect.  No larger area of underlying restricted diffusion. No other acute or chronic intracranial hemorrhage identified. Major intracranial vascular flow voids are preserved.  Additional scattered cerebral white matter T2 and FLAIR hyperintensity in a nonspecific pattern. No cortical encephalomalacia identified. No ventriculomegaly. Negative basilar cisterns. Negative pituitary, cervicomedullary junction and visualized cervical spine. Normal bone marrow signal. Postoperative changes to the globes. Visualized paranasal sinuses and mastoids are clear. Visible internal auditory structures appear normal. Visualized scalp soft tissues are within normal limits.  MRA HEAD FINDINGS  Study is mildly degraded by motion artifact despite repeated imaging attempts.  Antegrade flow in the posterior circulation with mildly dominant distal right vertebral artery. Patent PICA origins. Patent vertebrobasilar junction. No basilar stenosis. SCA and PCA origins are normal. Posterior communicating arteries are present. Bilateral PCA branches are within normal limits.  Antegrade flow in both ICA siphons. No ICA stenosis. Ophthalmic and posterior communicating artery origins are normal. Normal carotid termini, MCA and ACA origins.  Mild irregularity and stenosis of the left ACA A1 segment. Anterior communicating artery within normal limits. Azygos proximal ACA configuration incidentally noted. Bilateral ACA branches within normal limits. Visualized bilateral MCA branches are  patent with mild irregularity. M1  segments are within normal limits (incidental early branching on the right).  IMPRESSION: 1. Solitary small subacute hemorrhage in the left hemisphere white matter is slightly increased in size since 12/22/2013 (estimated volume 1-2 mL). Mild edema and no significant mass effect. 2. Negative for age intracranial MRA.   Electronically Signed   By: Lars Pinks M.D.   On: 12/23/2013 13:18   Mr Brain Wo Contrast  12/23/2013   CLINICAL DATA:  80 year old female with sudden onset inability to use the right upper extremity, right face numbness. Initial encounter.  EXAM: MRI HEAD WITHOUT CONTRAST  MRA HEAD WITHOUT CONTRAST  TECHNIQUE: Multiplanar, multiecho pulse sequences of the brain and surrounding structures were obtained without intravenous contrast. Angiographic images of the head were obtained using MRA technique without contrast.  COMPARISON:  [facility] CT without contrast 12/22/2013.  FINDINGS: MRI HEAD FINDINGS  Susceptibility artifact and signal abnormality associated with blood products at the left corona radiata tracking toward the posterior limb of the left internal capsule. This corresponds to the intracranial hemorrhage identified on 12/22/2013. The area of signal abnormality has mildly increased since that time, now up to 13 mm diameter (previously 9 mm). The blood products are T2 hyperintense and T1 hypo to isointense. Mild surrounding edema. No significant mass effect.  No larger area of underlying restricted diffusion. No other acute or chronic intracranial hemorrhage identified. Major intracranial vascular flow voids are preserved.  Additional scattered cerebral white matter T2 and FLAIR hyperintensity in a nonspecific pattern. No cortical encephalomalacia identified. No ventriculomegaly. Negative basilar cisterns. Negative pituitary, cervicomedullary junction and visualized cervical spine. Normal bone marrow signal. Postoperative changes to the globes. Visualized paranasal  sinuses and mastoids are clear. Visible internal auditory structures appear normal. Visualized scalp soft tissues are within normal limits.  MRA HEAD FINDINGS  Study is mildly degraded by motion artifact despite repeated imaging attempts.  Antegrade flow in the posterior circulation with mildly dominant distal right vertebral artery. Patent PICA origins. Patent vertebrobasilar junction. No basilar stenosis. SCA and PCA origins are normal. Posterior communicating arteries are present. Bilateral PCA branches are within normal limits.  Antegrade flow in both ICA siphons. No ICA stenosis. Ophthalmic and posterior communicating artery origins are normal. Normal carotid termini, MCA and ACA origins.  Mild irregularity and stenosis of the left ACA A1 segment. Anterior communicating artery within normal limits. Azygos proximal ACA configuration incidentally noted. Bilateral ACA branches within normal limits. Visualized bilateral MCA branches are patent with mild irregularity. M1 segments are within normal limits (incidental early branching on the right).  IMPRESSION: 1. Solitary small subacute hemorrhage in the left hemisphere white matter is slightly increased in size since 12/22/2013 (estimated volume 1-2 mL). Mild edema and no significant mass effect. 2. Negative for age intracranial MRA.   Electronically Signed   By: Lars Pinks M.D.   On: 12/23/2013 13:18       Medical Problem List and Plan:  1. Functional deficits secondary to : Left subcortical intracranial hemorrhage with right hemiparesis. Patient on aspirin prior to admission 81 mg daily plan to resume in one to 2 weeks per neurology services 2.  DVT Prophylaxis/Anticoagulation: SCDs. Monitor for any signs of DVT 3. Pain Management: Neurontin 100 mg each bedtime. Monitor with increased mobility 4. Mood/anxiety: Ativan 2 mg each bedtime. 5. Neuropsych: This patient is capable of making decisions on her own behalf. 6. Hypertension. Tenormin 25 mg twice a  day, Lasix 20 mg daily. Monitor  with increased mobility 7. Hypothyroidism. Synthroid 8. GERD. Pepcid. No nausea reported   Post Admission Physician Evaluation: 1. Functional deficits secondary  to left subcortical intracranial hemorrhage with right hemiparesis. 2. Patient is admitted to receive collaborative, interdisciplinary care between the physiatrist, rehab nursing staff, and therapy team. 3. Patient's level of medical complexity and substantial therapy needs in context of that medical necessity cannot be provided at a lesser intensity of care such as a SNF. 4. Patient has experienced substantial functional loss from his/her baseline which was documented above under the "Functional History" and "Functional Status" headings.  Judging by the patient's diagnosis, physical exam, and functional history, the patient has potential for functional progress which will result in measurable gains while on inpatient rehab.  These gains will be of substantial and practical use upon discharge  in facilitating mobility and self-care at the household level. 5. Physiatrist will provide 24 hour management of medical needs as well as oversight of the therapy plan/treatment and provide guidance as appropriate regarding the interaction of the two. 6. 24 hour rehab nursing will assist with bladder management, bowel management, safety, skin/wound care, disease management, medication administration, pain management, patient education and egosupport  and help integrate therapy concepts, techniques,education, etc. 7. PT will assess and treat for/with: Lower extremity strength, range of motion, stamina, balance, functional mobility, safety, adaptive techniques and equipment, NMR, anxiety mgt, stroke education.   Goals are: mod I . 8. OT will assess and treat for/with: ADL's, functional mobility, safety, upper extremity strength, adaptive techniques and equipment, NMR, stroke education, egosupport.   Goals are: mod I. 9. SLP  will assess and treat for/with: n/a.  Goals are: n/a. 10. Case Management and Social Worker will assess and treat for psychological issues and discharge planning. 11. Team conference will be held weekly to assess progress toward goals and to determine barriers to discharge. 12. Patient will receive at least 3 hours of therapy per day at least 5 days per week. 13. ELOS: 9-12 days. Pt lives alone and needs to reach mod I goals.    14. Prognosis:  excellent.     Meredith Staggers, MD, [facility]   12/25/2013

## 2013-12-25 NOTE — Progress Notes (Signed)
Rehab admissions - Evaluated for possible admission.  I met with patient.  We called her sister from the room and spoke with her daughter-in-law on the phone.  All are in agreement to inpatient rehab prior home.  Bed available today and will admit to acute inpatient rehab today.  Call me for questions.  #[phone]

## 2013-12-25 NOTE — Discharge Summary (Signed)
Stroke Discharge Summary  Patient ID: Lisa Kemp   MRN: [id number]      DOB: May 17, 1924  Date of Admission: 12/22/2013 Date of Discharge: 12/25/2013  Attending Physician:  Suzzanne Cloud, MD, Stroke MD  Consulting Physician(s):    Alysia Penna, MD (Physical Medicine & Rehabtilitation)  Patient's PCP:  Glenda Chroman., MD  Discharge Diagnoses:  Principal Problem:   Intracerebral hemorrhage Active Problems:   Accelerated hypertension BMI  Body mass index is 28.3 kg/(m^2).   Past Medical History  Diagnosis Date  . GERD (gastroesophageal reflux disease)   . Arthritis   . Hypothyroidism   . Hypertension    Past Surgical History  Procedure Laterality Date  . Colon surgery      Medications to be continued on Rehab . atenolol  25 mg Oral BID  . famotidine  20 mg Oral Daily  . ferrous sulfate  325 mg Oral Q breakfast  . furosemide  20 mg Oral Daily  . gabapentin  100 mg Oral QHS  . levothyroxine  100 mcg Oral QAC breakfast  . LORazepam  2 mg Oral QHS  . potassium chloride  10 mEq Oral Daily  . senna-docusate  1 tablet Oral BID    Lipid Panel    Component Value Date/Time   CHOL 157 12/23/2013 0228   TRIG 188* 12/23/2013 0228   HDL 35* 12/23/2013 0228   CHOLHDL 4.5 12/23/2013 0228   VLDL 38 12/23/2013 0228   LDLCALC 84 12/23/2013 0228   HgbA1C  Lab Results  Component Value Date   HGBA1C 5.5 12/23/2013    SIGNIFICANT DIAGNOSTIC STUDIES CT of the brain  12/24/2013 1. 1.2 x 0.7 cm focal intraparenchymal hemorrhage at the left posterior basal ganglia is relatively stable in size from recent prior MRI, though mildly increased in size from prior CT. Mild associated vasogenic edema again seen. No evidence of midline shift. The location remains most compatible with a hypertensive bleed. 2. No evidence of acute infarct. 3. Scattered small vessel ischemic microangiopathy.  12/22/2013 ([place]) 2x1x1 L CR hemorhage  MRI of the brain 12/23/2013 1. Solitary small  subacute hemorrhage in the left hemisphere white matter is slightly increased in size since 12/22/2013 (estimated volume 1-2 mL). Mild edema and no significant mass effect.  MRA of the brain 12/23/2013 2. Negative for age intracranial MRA.  2D Echocardiogram EF 65-70% with no source of embolus. Image quality limits evaluation of the mitral valve. Cannot exclude systolic anterior motion of the anterior leaflet as the underlying mechansm for MR.     History of Present Illness   Lisa Kemp is an 80 y.o. female who was at her baseline today 12/22/2013. Was able to go to work and after returning home was taking her blood pressure and noted that the cuff was abnormally tight. She tried to switch the cuff to the left and noted that she could not use her right arm well. She then noted that the right side of her face was numb. When she attempted to get up and alert her grandson she noted that her right leg would not support her. EMS was called and the patient was brought to [facility]. last known well 12/22/2013 at 1700. Patient was not administered TPA secondary to hemorrhage. She was admitted to the neuro ICU for further evaluation and treatment.   Hospital Course Imaging confirms a left corona radiata hemorrhage. Hemorrhage  Remained stable during hospitalization. It was felt to be secondary to accelerated hypertension. She  was on aspirin 81 mg orally every day prior to admission.   Patient with vascular risk factors of:  Accelerated hypertension 174/105 on arrival to [facility]  Patient with resultant right hemiparesis. Physical therapy, occupational therapy and speech therapy evaluated patient. All agreed inpatient rehab is needed. Patient's family is/are supportive and can provide care at discharge. CIR bed is available today and patient will be transferred there.  Discharge Exam  Blood pressure 137/51, pulse 77, temperature 97.8 F (36.6 C), temperature source Oral, resp. rate 20, height 5\' 2"   (1.575 m), weight 70.2 kg (154 lb 12.2 oz), SpO2 96.00%. Pleasant elderly Caucasian lady not in distress.Awake alert. Afebrile. Head is nontraumatic. Neck is supple without bruit. Hearing is normal. Cardiac exam no murmur or gallop. Lungs are clear to auscultation. Distal pulses are well felt.  Neurological Exam :  Awake alert oriented x3 normal speech and language. Extraocular movements are full range without nystagmus. Blinks to threat bilaterally. Fundi were not visualized. Vision acuity seems adequate. Mild right lower facial weakness. Tongue midline. Motor system exam revealed no upper or lower extremity drift. Mild weakness of right grip and intrinsic hand muscles. Orbits left-to-right upper extremity. Minimum right hip exam ankle dorsiflexor weakness. Sensation appears to be preserved bilaterally. Finger-to-nose and knee to heel coordination appears accurate. Gait was not tested.   Discharge Diet  Cardiac thin liquids  Discharge Plan  Disposition:  Transfer to [facility] for ongoing PT, OT and ST  Resume aspirin 81 mg daily in 1-2 weeks if patient remains medically stable  Recommend ongoing risk factor control by Primary Care Physician at time of discharge from inpatient rehabilitation.  Follow-up VYAS,DHRUV B., MD in 2 weeks following discharge from rehab.  Follow-up with Dr. Antony Contras, Stroke Clinic in 2 months.  25 minutes were spent preparing discharge.  Signed Burnetta Sabin, MSN, RN, ANVP-BC, AGPCNP-BC Stroke Center Nurse Practitioner 12/25/2013, 12:12 PM  I have personally examined this patient, reviewed pertinent data and developed the plan of care. I agree with above.  Antony Contras, MD
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
## 2013-12-25 NOTE — Interval H&P Note (Signed)
Lisa Kemp was admitted today to Inpatient Rehabilitation with the diagnosis of left subcortical intracranial hemorrhage.  The patient's history has been reviewed, patient examined, and there is no change in status.  Patient continues to be appropriate for intensive inpatient rehabilitation.  I have reviewed the patient's chart and labs.  Questions were answered to the patient's satisfaction.  Meredith Staggers 12/25/2013, 5:12 PM

## 2013-12-25 NOTE — Progress Notes (Signed)
Stroke Team Progress Note  HISTORY Lisa Kemp is an 80 y.o. female who was at her baseline today 12/22/2013. Was able to go to work and after returning home was taking her blood pressure and noted that the cuff was abnormally tight. She tried to switch the cuff to the left and noted that she could not use her right arm well. She then noted that the right side of her face was numb. When she attempted to get up and alert her grandson she noted that her right leg would not support her. EMS was called and the patient was brought to [facility]. last known well 12/22/2013 at 1700. Patient was not administered TPA secondary to hemorrhage. She was admitted to the neuro ICU for further evaluation and treatment.  SUBJECTIVE Doing well. Wants to go home but realizes the benefits of inpatient rehab and willing to go there  OBJECTIVE Most recent Vital Signs: Filed Vitals:   12/24/13 1734 12/24/13 2106 12/25/13 0228 12/25/13 0621  BP: 114/50 123/68 142/53 103/51  Pulse: 86 76 72 78  Temp: 98.4 F (36.9 C) 98.5 F (36.9 C) 98 F (36.7 C) 98.9 F (37.2 C)  TempSrc: Oral Oral Oral Oral  Resp: 20 22  18   Height:      Weight:      SpO2: 96% 96% 97% 92%   CBG (last 3)  No results found for this basename: GLUCAP,  in the last 72 hours  IV Fluid Intake:     MEDICATIONS  . atenolol  25 mg Oral BID  . famotidine  20 mg Oral Daily  . ferrous sulfate  325 mg Oral Q breakfast  . furosemide  20 mg Oral Daily  . gabapentin  100 mg Oral QHS  . levothyroxine  100 mcg Oral QAC breakfast  . LORazepam  2 mg Oral QHS  . potassium chloride  10 mEq Oral Daily  . senna-docusate  1 tablet Oral BID   PRN:  acetaminophen, acetaminophen, labetalol, ondansetron (ZOFRAN) IV  Diet:  Cardiac thin liquids Activity:  OOB DVT Prophylaxis:  SCDs   CLINICALLY SIGNIFICANT STUDIES Basic Metabolic Panel: No results found for this basename: NA, K, CL, CO2, GLUCOSE, BUN, CREATININE, CALCIUM, MG, PHOS,  in the last  168 hours Liver Function Tests: No results found for this basename: AST, ALT, ALKPHOS, BILITOT, PROT, ALBUMIN,  in the last 168 hours CBC: No results found for this basename: WBC, NEUTROABS, HGB, HCT, MCV, PLT,  in the last 168 hours Coagulation: No results found for this basename: LABPROT, INR,  in the last 168 hours Cardiac Enzymes: No results found for this basename: CKTOTAL, CKMB, CKMBINDEX, TROPONINI,  in the last 168 hours Urinalysis: No results found for this basename: COLORURINE, APPERANCEUR, LABSPEC, PHURINE, GLUCOSEU, HGBUR, BILIRUBINUR, KETONESUR, PROTEINUR, UROBILINOGEN, NITRITE, LEUKOCYTESUR,  in the last 168 hours Lipid Panel    Component Value Date/Time   CHOL 157 12/23/2013 0228   TRIG 188* 12/23/2013 0228   HDL 35* 12/23/2013 0228   CHOLHDL 4.5 12/23/2013 0228   VLDL 38 12/23/2013 0228   LDLCALC 84 12/23/2013 0228   HgbA1C  Lab Results  Component Value Date   HGBA1C 5.5 12/23/2013    Urine Drug Screen:   No results found for this basename: labopia,  cocainscrnur,  labbenz,  amphetmu,  thcu,  labbarb    Alcohol Level: No results found for this basename: ETH,  in the last 168 hours   CT of the brain   12/24/2013  1. 1.2 x 0.7 cm focal intraparenchymal hemorrhage at the left posterior basal ganglia is relatively stable in size from recent prior MRI, though mildly increased in size from prior CT. Mild associated vasogenic edema again seen. No evidence of midline shift. The location remains most compatible with a hypertensive bleed. 2. No evidence of acute infarct. 3. Scattered small vessel ischemic microangiopathy.    12/22/2013 ([place]) 2x1x1 L CR hemorhage  MRI of the brain  12/23/2013    1. Solitary small subacute hemorrhage in the left hemisphere white matter is slightly increased in size since 12/22/2013 (estimated volume 1-2 mL). Mild edema and no significant mass effect.   MRA of the brain  12/23/2013   2. Negative for age intracranial MRA.     2D Echocardiogram EF  65-70% with no source of embolus. Image quality limits evaluation of the mitral valve. Cannot exclude systolic anterior motion of the anterior leaflet as the underlying mechansm for MR.  Therapy Recommendations CIR  Physical Exam   Pleasant elderly Caucasian lady not in distress.Awake alert. Afebrile. Head is nontraumatic. Neck is supple without bruit. Hearing is normal. Cardiac exam no murmur or gallop. Lungs are clear to auscultation. Distal pulses are well felt. Neurological Exam :  Awake alert oriented x3 normal speech and language. Extraocular movements are full range without nystagmus. Blinks to threat bilaterally. Fundi were not visualized. Vision acuity seems adequate. Mild right lower facial weakness. Tongue midline. Motor system exam revealed no upper or lower extremity drift. Mild weakness of right grip and intrinsic hand muscles. Orbits left-to-right upper extremity. Minimum right hip exam ankle dorsiflexor weakness. Sensation appears to be preserved bilaterally. Finger-to-nose and knee to heel coordination appears accurate. Gait was not tested.  ASSESSMENT [name] is a 80 y.o. female presenting with right sided weakness. Imaging confirms a left corona radiata hemorrhage. Hemorrhage felt to be secondary to accelerated hypertension.  On aspirin 81 mg orally every day prior to admission. Stroke work up completed.  Accelerated hypertension 174/105 on arrival to [facility] day # 3  TREATMENT/PLAN  D/c to rehab today if bed available  Resume aspirin in 1-2 weeks  F/U as outpatient in 2 months.  I have personally obtained a history, examined the patient, evaluated imaging results, and formulated the assessment and plan of care. I agree with the above. Antony Contras, MD   To contact Stroke Continuity provider, please refer to [email or web address] After hours, contact General Neurology

## 2013-12-25 NOTE — Progress Notes (Signed)
Pt was transferred at 1555 via wheelchair from 4N to [id number]. Pt alert and oriented x4. Pt has no skin issues; has multiple raised moles and has no complaints of pain. Pt oriented to room, bed alarm, how to call for nurse, safety plan, and therapy schedule. Rehab stroke book also reviewed with patient, and verbal understanding was given regarding all topics previously stated. Pt bed placed in lowest position with call bell in reach, 3 siderails up, and the bed alarm activated. Report received from [place], Therapist, sports. Will continue to monitor.

## 2013-12-26 ENCOUNTER — Inpatient Hospital Stay (HOSPITAL_COMMUNITY): Payer: Medicare Other

## 2013-12-26 ENCOUNTER — Inpatient Hospital Stay (HOSPITAL_COMMUNITY): Payer: Medicare Other | Admitting: *Deleted

## 2013-12-26 DIAGNOSIS — I6330 Cerebral infarction due to thrombosis of unspecified cerebral artery: Secondary | ICD-10-CM

## 2013-12-26 DIAGNOSIS — F341 Dysthymic disorder: Secondary | ICD-10-CM

## 2013-12-26 DIAGNOSIS — I2109 ST elevation (STEMI) myocardial infarction involving other coronary artery of anterior wall: Secondary | ICD-10-CM

## 2013-12-26 NOTE — Evaluation (Signed)
Occupational Therapy Assessment and Plan  Patient Details  Name: Lisa Kemp MRN: [id number] Date of Birth: 1924/03/15  OT Diagnosis: ataxia, hemiplegia affecting dominant side, muscle weakness (generalized) and pain in joint Rehab Potential: Rehab Potential: Good ELOS: 12-14 days  Today's Date: 12/26/2013 Time: 8250-5397 Time Calculation (min): 60 min  1st session  Problem List:  Patient Active Problem List   Diagnosis Date Noted  . SDH (subdural hematoma) 12/25/2013  . Accelerated hypertension 12/23/2013  . Intracerebral hemorrhage 12/22/2013  . ESSENTIAL HYPERTENSION, BENIGN 08/01/2009  . HYPERLIPIDEMIA-MIXED 06/12/2009  . CAD, NATIVE VESSEL 06/12/2009    Past Medical History:  Past Medical History  Diagnosis Date  . Mixed hyperlipidemia     Statin intolerant  . Gastrointestinal bleed     Related to NSAIDs  . Coronary atherosclerosis of native coronary artery     Nonobstructive at catherization 2006  . Hiatal hernia     Moderate sized sliding hiatal hernia  . Esophageal ring     Distal esophageal ring status post dilation  . Colon polyps   . GERD (gastroesophageal reflux disease)   . Arthritis   . Hypothyroidism   . Hypertension    Past Surgical History:  Past Surgical History  Procedure Laterality Date  . Appendectomy    . Cataract extraction    . Total abdominal hysterectomy w/ bilateral salpingoophorectomy    . Total abdominal hysterectomy    . Colon surgery      Assessment & Plan Clinical Impression:  [name] is a 80 y.o. right-handed female with history of hypertension. Patient was independent prior to admission living alone and still working part time. Admitted 12/22/2013 with right-sided weakness upon transfer from [facility]. Cranial CT scan at outside hospital showed left internal capsule hemorrhage 7 x 9 mm. A followup Cranial CT scan showed a 1.2 x 0.7 cm focal intraparenchymal hemorrhage at the left posterior basal ganglia  did MRI showed small subacute hemorrhage left hemisphere white matter slightly increased in size since prior study of 12/22/2013 with mild edema without significant mass effect. MRA of the brain negative. Echocardiogram ejection fraction of 70% no wall motion abnormalities. Neurology service followup advise conservative care. Noted accelerated hypertension 174/105 on admission and monitored. Patient had been on aspirin prior to admission advise to hold and resume in one to 2 weeks. Physical therapy evaluation completed 12/24/2013 with recommendations for physical medicine rehabilitation consult. Admitted for comprehensive rehabilitation program Patient transferred to [facility] on 12/25/2013 .    Patient currently requires min with basic self-care skills and IADL secondary to impaired timing and sequencing, unbalanced muscle activation and decreased coordination.  Prior to hospitalization, patient could complete BADL with independent .  Patient will benefit from skilled intervention to increase independence with basic self-care skills and increase level of independence with iADL prior to discharge home independently.  Anticipate patient will require intermittent supervision and follow up home health.  OT - End of Session Activity Tolerance: Tolerates 30+ min activity without fatigue Endurance Deficit: Yes Endurance Deficit Description: Pt fatigues with activity. Noted shortness of breath. O2 sats: 94% on RA, HR: 95 BPM after ambulation.  OT Assessment Rehab Potential: Good Barriers to Discharge: Decreased caregiver support;Other (comment) OT Patient demonstrates impairments in the following area(s): Balance;Endurance;Motor;Safety OT Basic ADL's Functional Problem(s): Grooming;Bathing;Dressing;Toileting OT Advanced ADL's Functional Problem(s): Simple Meal Preparation;Laundry OT Transfers Functional Problem(s): Toilet;Tub/Shower OT Additional Impairment(s): Fuctional Use of Upper Extremity OT Plan OT  Intensity: Minimum of 1-2 x/day, 45 to 90 minutes  OT Frequency: 5 out of 7 days OT Duration/Estimated Length of Stay: 10-12 days OT Treatment/Interventions: Balance/vestibular training;Discharge planning;DME/adaptive equipment instruction;Functional mobility training;Self Care/advanced ADL retraining;Therapeutic Activities;Therapeutic Exercise;UE/LE Strength taining/ROM;UE/LE Coordination activities;Neuromuscular re-education;Patient/family education OT Self Feeding Anticipated Outcome(s): independent OT Basic Self-Care Anticipated Outcome(s): mod I  OT Toileting Anticipated Outcome(s): mod I  OT Bathroom Transfers Anticipated Outcome(s): mod I to toilet; supervision with tub transfers OT Recommendation Patient destination: Home Follow Up Recommendations: Home health OT   Skilled Therapeutic Intervention 2nd session:  Time: 1405-1430  (25 min) Pain:none Individual session Addressed toileting and toilet transfer.  Pt. Ambulated with RW to 3n 1 over toilet with nursing.  Needed minimal to SBA for balance.  Pt. Ambulated out to sink and washed hands.  Went over goals and POC with sister and patient.  Left pt in room in recliner.     OT Evaluation Precautions/Restrictions  Precautions Precautions: Fall Restrictions Weight Bearing Restrictions: No General   Vital Signs Therapy Vitals Pulse Rate: 95 Oxygen Therapy SpO2: 94 % O2 Device: None (Room air) Pain  none   Home Living/Prior Functioning Home Living Available Help at Discharge: Family;Available PRN/intermittently;Other (Comment) Type of Home: House Home Access: Stairs to enter [employer] of Steps: 1 step to enter the front and 2 steps with right rail to enter carport (pt uses this entrance to feed her dog) Entrance Stairs-Rails: Right Home Layout: One level Additional Comments: Pt was walking through grass surfaces to feed her dog daily.   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  Leisure: Hobbies-yes (Comment) Comments: nascar race fan (cat sleeps with her; dog stays outside) ADL   Vision/Perception  Vision- History Baseline Vision/History: Wears glasses Wears Glasses: Reading only Patient Visual Report: No change from baseline Vision- Assessment Vision Assessment?: No apparent visual deficits Eye Alignment: Within Functional Limits Ocular Range of Motion: Within Functional Limits Perception Perception: Within Functional Limits Praxis Praxis: Intact  Cognition Overall Cognitive Status: Within Functional Limits for tasks assessed Arousal/Alertness: Awake/alert Orientation Level: Oriented X4 Attention: Sustained Sustained Attention: Appears intact Memory: Appears intact Awareness: Appears intact Problem Solving: Appears intact Safety/Judgment: Appears intact Sensation Sensation Light Touch: Appears Intact Proprioception: Appears Intact Additional Comments: Pt reports neuropathy in BLE, with consistent burning in bilateral feet. BLE intact to light touch, proprioception.  Coordination Gross Motor Movements are Fluid and Coordinated: Yes Fine Motor Movements are Fluid and Coordinated: No Finger Nose Finger Test: 6x/20 sec RUE; 14 on left Motor  Motor Motor: Hemiplegia;Ataxia Motor - Skilled Clinical Observations: decreased RUE control and  miss  hitting Mobility  Bed Mobility Bed Mobility: Rolling Right;Right Sidelying to Sit;Supine to Sit;Sitting - Scoot to Edge of Bed Rolling Right: 5: Supervision Rolling Right Details: Verbal cues for precautions/safety;Verbal cues for technique Right Sidelying to Sit: 5: Supervision Right Sidelying to Sit Details: Verbal cues for technique;Verbal cues for precautions/safety Supine to Sit: 5: Supervision Supine to Sit Details: Verbal cues for sequencing;Verbal cues for technique;Verbal cues for precautions/safety Sitting - Scoot to Edge of Bed: 5: Supervision Sitting -  Scoot to [employer] of Bed Details: Verbal cues for sequencing;Verbal cues for technique;Verbal cues for precautions/safety Transfers Sit to Stand: 4: Min assist Sit to Stand Details: Visual cues/gestures for precautions/safety Stand to Sit: 4: Min assist Stand to Sit Details (indicate cue type and reason): Tactile cues for sequencing;Visual cues for safe use of DME/AE;Verbal cues for sequencing;Verbal cues for technique;Verbal cues for precautions/safety;Verbal cues for safe use of DME/AE  Trunk/Postural Assessment  Cervical Assessment Cervical Assessment: Within Functional Limits Thoracic Assessment Thoracic Assessment: Exceptions to [facility] Thoracic AROM Overall Thoracic AROM: Due to premorid status Lumbar Assessment Lumbar Assessment: Within Functional Limits Postural Control Postural Control: Within Functional Limits  Balance   Extremity/Trunk Assessment RUE Assessment RUE Assessment: Exceptions to [facility] RUE AROM (degrees) Overall AROM Right Upper Extremity: Deficits LUE Assessment LUE Assessment: Within Functional Limits  FIM:  FIM - Toileting Toileting steps completed by patient: Adjust clothing prior to toileting;Performs perineal hygiene;Adjust clothing after toileting Toileting: 4: Steadying assist FIM - Control and instrumentation engineer Devices: Walker;Bed rails;Arm rests Bed/Chair Transfer: 5:  Supine > Sit: Supervision (verbal cues/safety issues);2: Bed > Chair or W/C: Max A (lift and lower assist);2: Chair or W/C > Bed: Max A (lift and lower assist) FIM - Radio producer Devices: Bedside commode;Grab bars;Walker Toilet Transfers: 2-To toilet/BSC: Max A (lift and lower assist);2-From toilet/BSC: Max A (lift and lower assist)   Refer to Care Plan for Long Term Goals  Recommendations for other services: None  Discharge Criteria: Patient will be discharged from OT if patient refuses treatment 3 consecutive times without medical reason, if treatment goals not met, if there is a change in medical status, if patient makes no progress towards goals or if patient is discharged from hospital.  The above assessment, treatment plan, treatment alternatives and goals were discussed and mutually agreed upon: by patient  Lisa Roca 12/26/2013, 12:22 PM

## 2013-12-26 NOTE — Progress Notes (Signed)
Physical Therapy Session Note  Patient Details  Name: Lisa Kemp MRN: [id number] Date of Birth: 1924-07-14  Today's Date: 12/26/2013 Time: 0258-5277 Time Calculation (min): 30 min  Short Term Goals: Week 1:  PT Short Term Goal 1 (Week 1): Pt will be able to ambulate 100 feet using the LRAD at supervision level to promote increased functional mobility.  PT Short Term Goal 2 (Week 1): Pt will be able to transfer from various level surfaces/heights at supervision level to promote overall funcitonal independence, allowing pt to complete toileting.  PT Short Term Goal 3 (Week 1): Pt will be able to negotiate up/down 2 steps using a unilateral/right railing in order to promote safe entry/exit of the home.  PT Short Term Goal 4 (Week 1): Pt will be able to complete lower extremity exercises, 10-15 reps to promote increased strength and overall functional mobility.  Skilled Therapeutic Interventions/Progress Updates:  Pt received sitting in recliner chair with sister present in room. Pt agreeable to therapy. Pt completed stand pivot tsf from recliner to w/c with min A and cues for technique, hand/foot placement and safety. Pt propelled pt to gym in w/c. Pt negotiated up/down 8 steps using bilateral hand rails with min A and cues for sequencing. Pt able to ambulate in hallways 60 feet x2 with RW and min A, cues for safety and foot placement. Pt requires a seated rest break after each activity. Pt returned to room, seated in recliner chair with sister present. All needs in reach.   Therapy Documentation Precautions:  Precautions Precautions: Fall Restrictions Weight Bearing Restrictions: No  Pain Assessment Pain Assessment: No/denies pain Pain Score: 0-No pain     See FIM for current functional status  Therapy/Group: Individual Therapy  Negan Grudzien R Aiko Belko 12/26/2013, 2:29 PM

## 2013-12-26 NOTE — Evaluation (Signed)
Physical Therapy Assessment and Plan  Patient Details  Name: Lisa Kemp MRN: [id number] Date of Birth: 12/13/1923  PT Diagnosis: Abnormality of gait and Hemiplegia dominant Rehab Potential: Good ELOS: 12- 14 days   Today's Date: 12/26/2013 Time: 0800-0915 Time Calculation (min): 75 min  Problem List:  Patient Active Problem List   Diagnosis Date Noted  . SDH (subdural hematoma) 12/25/2013  . Accelerated hypertension 12/23/2013  . Intracerebral hemorrhage 12/22/2013  . ESSENTIAL HYPERTENSION, BENIGN 08/01/2009  . HYPERLIPIDEMIA-MIXED 06/12/2009  . CAD, NATIVE VESSEL 06/12/2009    Past Medical History:  Past Medical History  Diagnosis Date  . Mixed hyperlipidemia     Statin intolerant  . Gastrointestinal bleed     Related to NSAIDs  . Coronary atherosclerosis of native coronary artery     Nonobstructive at catherization 2006  . Hiatal hernia     Moderate sized sliding hiatal hernia  . Esophageal ring     Distal esophageal ring status post dilation  . Colon polyps   . GERD (gastroesophageal reflux disease)   . Arthritis   . Hypothyroidism   . Hypertension    Past Surgical History:  Past Surgical History  Procedure Laterality Date  . Appendectomy    . Cataract extraction    . Total abdominal hysterectomy w/ bilateral salpingoophorectomy    . Total abdominal hysterectomy    . Colon surgery      Assessment & Plan Clinical Impression : Lisa Kemp is a 80 y.o. right-handed female with history of hypertension. Patient was independent prior to admission living alone and still working part time. Admitted 12/22/2013 with right-sided weakness upon transfer from [facility]. Cranial CT scan at outside hospital showed left internal capsule hemorrhage 7 x 9 mm. A followup Cranial CT scan showed a 1.2 x 0.7 cm focal intraparenchymal hemorrhage at the left posterior basal ganglia did MRI showed small subacute hemorrhage left hemisphere white matter slightly  increased in size since prior study of 12/22/2013 with mild edema without significant mass effect. MRA of the brain negative. Echocardiogram ejection fraction of 70% no wall motion abnormalities. Neurology service followup advise conservative care. Noted accelerated hypertension 174/105 on admission and monitored. Patient had been on aspirin prior to admission advise to hold and resume in one to 2 weeks.  Patient transferred to CIR on 12/25/2013 .   Patient currently requires min with mobility secondary to muscle weakness and impaired timing and sequencing and decreased coordination.  Prior to hospitalization, patient was independent  with mobility and lived with Alone in a House home.  Home access is 1 step to enter front entrance and 2 steps to enter through carport with right railing ( pt uses this entrance to feed her dog daily).   Patient will benefit from skilled PT intervention to maximize safe functional mobility and decrease caregiver burden for planned discharge home alone.  Anticipate patient will benefit from follow up [place] at discharge.  PT - End of Session Activity Tolerance: Tolerates 30+ min activity with multiple rests Endurance Deficit: Yes Endurance Deficit Description: Pt fatigues with activity. Noted shortness of breath. O2 sats: 94% on RA, HR: 95 BPM after ambulation.  PT Assessment Rehab Potential: Good Barriers to Discharge: Decreased caregiver support PT Patient demonstrates impairments in the following area(s): Balance;Endurance;Motor;Pain;Safety;Sensory PT Transfers Functional Problem(s): Bed Mobility;Car;Furniture;Floor;Bed to Chair PT Locomotion Functional Problem(s): Ambulation;Stairs PT Plan PT Intensity: Minimum of 1-2 x/day ,45 to 90 minutes PT Frequency: 5 out of 7 days PT Duration Estimated Length of  Stay: 12- 14 days PT Treatment/Interventions: Ambulation/gait training;Balance/vestibular training;Cognitive remediation/compensation;Community reintegration;Discharge  planning;Disease management/prevention;DME/adaptive equipment instruction;Functional electrical stimulation;Functional mobility training;Neuromuscular re-education;Pain management;Patient/family education;Psychosocial support;Splinting/orthotics;Stair training;Therapeutic Activities;Therapeutic Exercise;UE/LE Strength taining/ROM;UE/LE Coordination activities;Visual/perceptual remediation/compensation;Wheelchair propulsion/positioning PT Transfers Anticipated Outcome(s): Modified Independent PT Locomotion Anticipated Outcome(s): Modified Indepenent PT Recommendation Follow Up Recommendations: Home health PT Patient destination: Home Equipment Recommended: Rolling walker with 5" wheels (or possibly single point cane. TBD) Equipment Details: Pt has a shower seat and older Bern that belonged to her mother.   Skilled Therapeutic Intervention  Pt received supine in bed. Pt agreeable to therapy session if allowed to brush her hair prior to leaving the room. Pt alert and oriented. Pt completed bed mobility, including supine to sit transfer at supervision level with cues for safety. Pt transfers from bed to w/c with min A/CGA. Pt propelled w/c in hallway x160 feet using BUE at supervision level with cues for safety and technique. Pt initially reports a HA in sitting the resolved quickly. Pt ambulated 70 feet with RW, requiring min A and verbal/tactile cues to promote weight shifting to the left, increased hip/knee flexion of the RLE and to promote clearance of the RLE as pt demonstrates toe drag of the RLE. Pt with noted fatigue and SHOB after ambulation. Vitals noted. Pt returned to room and ambulated to bathroom to have BM. Pt assisted to recliner chair at completion. All needs within reach.   PT Evaluation Precautions/Restrictions Precautions Precautions: Fall Restrictions Weight Bearing Restrictions: No General   Vital SignsTherapy Vitals Pulse Rate: 95 BP: 112/62 mmHg Oxygen Therapy SpO2: 94 % O2  Device: None (Room air) Pain Pain Assessment Pain Assessment: 0-10 Pain Score: 3  Pain Type: Chronic pain Pain Location: Foot Pain Orientation: Right;Left Pain Descriptors / Indicators: Burning Home Living/Prior Functioning Home Living Available Help at Discharge: Family;Available PRN/intermittently;Other (Comment) (Grandson lives across the street, but works full time. Sister also available but lives 30 min away. ) Type of Home: House Home Access: Stairs to enter [employer] of Steps: 1 step to enter the front and 2 steps with right rail to enter [place] (pt uses this entrance to feed her dog) Entrance Stairs-Rails: Right Home Layout: One level Additional Comments: Pt was walking through grass surfaces to feed her dog daily.   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  Leisure: Hobbies-yes (Comment) Comments: nascar race fan Vision/Perception     Cognition Overall Cognitive Status: Within Functional Limits for tasks assessed Arousal/Alertness: Awake/alert Orientation Level: Oriented X4 Attention: Sustained Sustained Attention: Appears intact Awareness: Appears intact Problem Solving: Appears intact Safety/Judgment: Appears intact Sensation Sensation Light Touch: Appears Intact Proprioception: Appears Intact Additional Comments: Pt reports neuropathy in BLE, with consistent burning in bilateral feet. BLE intact to light touch, proprioception.  Coordination Gross Motor Movements are Fluid and Coordinated: Yes Fine Motor Movements are Fluid and Coordinated: No Finger Nose Finger Test: L side WFL, R side mild impairment.  Motor   Motor Motor: Hemiplegia;Ataxia (RUE/RLE)  Mobility Bed Mobility Bed Mobility: Rolling Right;Right Sidelying to Sit;Supine to Sit;Sitting - Scoot to Edge of Bed Rolling Right: 5: Supervision Rolling Right Details: Verbal cues for precautions/safety;Verbal cues for technique Right Sidelying to Sit: 5: Supervision Right Sidelying to Sit Details: Verbal cues for technique;Verbal cues for precautions/safety Supine to Sit: 5:  Supervision Supine to Sit Details: Verbal cues for sequencing;Verbal cues for technique;Verbal cues for precautions/safety Sitting - Scoot to Edge of Bed: 5: Supervision Sitting - Scoot to Edge of Bed Details: Verbal cues for sequencing;Verbal cues for technique;Verbal cues for precautions/safety Transfers Transfers: Yes Sit to Stand: 4: Min assist Sit to Stand Details: Tactile cues for sequencing;Tactile cues for weight shifting;Verbal cues for sequencing;Verbal cues for technique;Visual cues/gestures for sequencing;Verbal cues for precautions/safety;Verbal cues for safe use of DME/AE;Visual cues for safe use of DME/AE Stand to Sit: 4: Min assist Stand to Sit Details (indicate cue type and reason): Tactile cues for sequencing;Visual cues for safe use of DME/AE;Verbal cues for sequencing;Verbal cues for technique;Verbal cues for precautions/safety;Verbal cues for safe use of DME/AE Stand Pivot Transfers: 4: Min guard Stand Pivot Transfer Details: Verbal cues for sequencing;Verbal cues for technique;Verbal cues for precautions/safety Locomotion  Ambulation Ambulation: Yes Ambulation/Gait Assistance: 4: Min assist Assistive device: Rolling walker Ambulation/Gait Assistance Details: Tactile cues for sequencing;Visual cues for safe use of DME/AE;Visual cues/gestures for sequencing;Verbal cues for sequencing;Verbal cues for technique;Verbal cues for precautions/safety;Verbal cues for safe use of DME/AE Ambulation/Gait Assistance Details: Pt ambulated 70 feet with use of RW  with min A, requiring verbal and tactile cues to promote weight shifting to the left and increased hip/knee flexion of the RLE for foot clearance of the RLE.  Stairs / Additional Locomotion Stairs: No Architect: Yes Wheelchair Assistance: 5: Investment banker, operational Details: Verbal cues for sequencing;Verbal cues for technique;Verbal cues for safe use of DME/AE Wheelchair Propulsion: Both upper extremities Wheelchair Parts Management: Supervision/cueing Distance: 160  Trunk/Postural Assessment  Cervical Assessment Cervical Assessment: Within Functional Limits Thoracic Assessment Thoracic Assessment: Exceptions to [facility] (increased kyphosis) Lumbar Assessment Lumbar Assessment: Within Functional Limits Postural Control Postural Control: Within Functional Limits  Balance Sittining: Fair Standing: Poor +   Extremity Assessment      RLE Assessment RLE Assessment: Exceptions to [facility] RLE AROM (degrees) Overall AROM Right Lower Extremity: Within functional limits for tasks assessed RLE Strength Right Hip Flexion: 3+/5 Right Hip Extension: 3+/5 Right Hip ABduction: 3+/5 Right Hip ADduction: 3+/5 Right Knee Flexion: 3+/5 Right Knee Extension: 3+/5 Right Ankle Dorsiflexion: 3/5 Right Ankle Plantar Flexion: 3/5 LLE Assessment LLE Assessment: Within Functional Limits (4+/5)  FIM:  FIM - Control and instrumentation engineer Devices: Walker;Bed rails;Arm rests Bed/Chair Transfer: 5: Supine > Sit: Supervision (verbal cues/safety issues);2: Bed > Chair or W/C: Max A (lift and lower assist);2: Chair or W/C > Bed: Max A (lift and lower assist) FIM - Locomotion: Wheelchair Distance: 160 Locomotion: Wheelchair: 5: Travels 150 ft or more: maneuvers on rugs and over door sills with supervision, cueing or coaxing FIM - Locomotion: Ambulation Locomotion: Ambulation Assistive Devices: Administrator Ambulation/Gait Assistance: 4: Min  assist Locomotion: Ambulation: 2: Travels 50 - 149 ft with minimal assistance (Pt.>75%)   Refer to Care Plan for Long Term Goals  Recommendations for other services: None  Discharge Criteria: Patient will be discharged from PT if patient refuses treatment 3 consecutive times without medical reason, if treatment goals not met, if there is a change in medical status, if patient makes no progress towards goals or if patient is discharged from hospital.  The above assessment, treatment plan, treatment alternatives and goals were discussed and mutually agreed upon: by patient  [employer] 12/26/2013, 11:42 AM

## 2013-12-26 NOTE — Progress Notes (Signed)
Subjective/Complaints: 80 y.o. right-handed female with history of hypertension. Patient was independent prior to admission living alone and still working part time. Admitted 12/22/2013 with right-sided weakness upon transfer from [facility]. Cranial CT scan at outside hospital showed left internal capsule hemorrhage 7 x 9 mm. A followup Cranial CT scan showed a 1.2 x 0.7 cm focal intraparenchymal hemorrhage at the left posterior basal ganglia did MRI showed small subacute hemorrhage left hemisphere white matter slightly increased in size since prior study of 12/22/2013 with mild edema without significant mass effect. MRA of the brain negative. Echocardiogram ejection fraction of 70% no wall motion abnormalities. Neurology service followup advise conservative care. Noted accelerated hypertension 174/105 on admission and monitored  Slept well except some buring pain in feet.  Pt states she has neuropathy but no hx of diabetes  Review of Systems - Negative except RIght side weak Objective: Vital Signs: Blood pressure 104/53, pulse 72, temperature 97.9 F (36.6 C), temperature source Oral, resp. rate 20, height 5\' 2"  (1.575 m), weight 69.4 kg (153 lb), SpO2 94.00%. No results found. No results found for this or any previous visit (from the past 72 hour(s)).    Constitutional: She is oriented to person, place, and time.  HENT:  Head: Normocephalic.  Eyes: EOM are normal.  Neck: Normal range of motion. Neck supple. No thyromegaly present.  Cardiovascular: Normal rate and regular rhythm.  Respiratory: Effort normal and breath sounds normal. No respiratory distress.  GI: Soft. Bowel sounds are normal. She exhibits no distension.  Neurological: She is alert and oriented to person, place, and time.  Follows full commands  Skin: Skin is warm and dry.  3 minus/5 right deltoid, 3+ right biceps triceps grip  5/5 left deltoid, bicep, tricep, grip  /5 right hip flexor knee extensor ankle  dorsiflexor and plantar flexor  5/5 left hip flexor knee extensor ankle dysfunction plantarflexion  Sensation mildly reduced to light touch in the right hand. Intact in the right lower extremity and on the left side.  No evidence of ataxia on cerebellar testing  Musculoskeletal valgus deformity right greater than left knee   Assessment/Plan: 1. Functional deficits secondary to Left subcortical ICH with Right hemiparesis which require 3+ hours per day of interdisciplinary therapy in a comprehensive inpatient rehab setting. Physiatrist is providing close team supervision and 24 hour management of active medical problems listed below. Physiatrist and rehab team continue to assess barriers to discharge/monitor patient progress toward functional and medical goals. FIM:                   Comprehension Comprehension: 6-Follows complex conversation/direction: With extra time/assistive device  Expression Expression: 7-Expresses complex ideas: With no assist  Social Interaction Social Interaction: 6-Interacts appropriately with others with medication or extra time (anti-anxiety, antidepressant).  Problem Solving Problem Solving: 6-Solves complex problems: With extra time  Memory Memory: 6-More than reasonable amt of time   Medical Problem List and Plan:  1. Functional deficits secondary to : Left subcortical intracranial hemorrhage with right hemiparesis. Patient on aspirin prior to admission 81 mg daily plan to resume in one to 2 weeks per neurology services  2. DVT Prophylaxis/Anticoagulation: SCDs. Monitor for any signs of DVT  3. Pain Management: Neurontin 100 mg each bedtime, increase to 300mg . Monitor with increased mobility  4. Mood/anxiety: Ativan 2 mg each bedtime.  5. Neuropsych: This patient is capable of making decisions on her own behalf.  6. Hypertension. Tenormin 25 mg twice a day, Lasix 20 mg  daily. Monitor with increased mobility  7. Hypothyroidism. Synthroid   8. GERD. Pepcid. No nausea reported  LOS (Days) 1 A FACE TO FACE EVALUATION WAS PERFORMED  Charlett Blake 12/26/2013, 6:54 AM

## 2013-12-27 ENCOUNTER — Inpatient Hospital Stay (HOSPITAL_COMMUNITY): Payer: Medicare Other | Admitting: *Deleted

## 2013-12-27 NOTE — Progress Notes (Signed)
Subjective/Complaints: 80 y.o. right-handed female with history of hypertension. Patient was independent prior to admission living alone and still working part time. Admitted 12/22/2013 with right-sided weakness upon transfer from [facility]. Cranial CT scan at outside hospital showed left internal capsule hemorrhage 7 x 9 mm. A followup Cranial CT scan showed a 1.2 x 0.7 cm focal intraparenchymal hemorrhage at the left posterior basal ganglia did MRI showed small subacute hemorrhage left hemisphere white matter slightly increased in size since prior study of 12/22/2013 with mild edema without significant mass effect. MRA of the brain negative. Echocardiogram ejection fraction of 70% no wall motion abnormalities. Neurology service followup advise conservative care. Noted accelerated hypertension 174/105 on admission and monitored  C/o about not getting the right breakfast No pain issues Felt like she did well in therapy  Review of Systems - Negative except RIght side weak Objective: Vital Signs: Blood pressure 134/73, pulse 70, temperature 97.5 F (36.4 C), temperature source Oral, resp. rate 18, height 5\' 2"  (1.575 m), weight 69.4 kg (153 lb), SpO2 95.00%. No results found. No results found for this or any previous visit (from the past 72 hour(s)).    Constitutional: She is oriented to person, place, and time.  HENT:  Head: Normocephalic.  Eyes: EOM are normal.  Neck: Normal range of motion. Neck supple. No thyromegaly present.  Cardiovascular: Normal rate and regular rhythm.  Respiratory: Effort normal and breath sounds normal. No respiratory distress.  GI: Soft. Bowel sounds are normal. She exhibits no distension.  Neurological: She is alert and oriented to person, place, and time.  Follows full commands  Skin: Skin is warm and dry.  3 minus/5 right deltoid, 3+ right biceps triceps grip  5/5 left deltoid, bicep, tricep, grip  /5 right hip flexor knee extensor ankle dorsiflexor  and plantar flexor  5/5 left hip flexor knee extensor ankle dysfunction plantarflexion  Sensation mildly reduced to light touch in the right hand. Intact in the right lower extremity and on the left side.  No evidence of ataxia on cerebellar testing  Musculoskeletal valgus deformity right greater than left knee   Assessment/Plan: 1. Functional deficits secondary to Left subcortical ICH with Right hemiparesis which require 3+ hours per day of interdisciplinary therapy in a comprehensive inpatient rehab setting. Physiatrist is providing close team supervision and 24 hour management of active medical problems listed below. Physiatrist and rehab team continue to assess barriers to discharge/monitor patient progress toward functional and medical goals. FIM: FIM - Bathing Bathing Steps Patient Completed: Chest;Right Arm;Left Arm;Abdomen;Front perineal area;Buttocks;Right upper leg;Left upper leg;Left lower leg (including foot) Bathing: 4: Min-Patient completes 8-9 69f 10 parts or 75+ percent  FIM - Upper Body Dressing/Undressing Upper body dressing/undressing: 0: Wears gown/pajamas-no public clothing FIM - Lower Body Dressing/Undressing Lower body dressing/undressing: 0: Wears gown/pajamas-no public clothing  FIM - Toileting Toileting steps completed by patient: Adjust clothing prior to toileting;Performs perineal hygiene;Adjust clothing after toileting Toileting Assistive Devices: Grab bar or rail for support Toileting: 4: Steadying assist  FIM - Radio producer Devices: Bedside commode;Grab bars;Walker Toilet Transfers: 4-To toilet/BSC: Min A (steadying Pt. > 75%);4-From toilet/BSC: Min A (steadying Pt. > 75%)  FIM - Control and instrumentation engineer Devices: Walker;Bed rails;Arm rests Bed/Chair Transfer: 5: Supine > Sit: Supervision (verbal cues/safety issues);2: Bed > Chair or W/C: Max A (lift and lower assist);2: Chair or W/C > Bed: Max A (lift  and lower assist)  FIM - Locomotion: Wheelchair Distance: 160 Locomotion: Wheelchair: 5: Travels 150  ft or more: maneuvers on rugs and over door sills with supervision, cueing or coaxing FIM - Locomotion: Ambulation Locomotion: Ambulation Assistive Devices: Walker - Rolling Ambulation/Gait Assistance: 4: Min assist Locomotion: Ambulation: 2: Travels 50 - 149 ft with minimal assistance (Pt.>75%)  Comprehension Comprehension Mode: Auditory Comprehension: 6-Follows complex conversation/direction: With extra time/assistive device  Expression Expression Mode: Verbal Expression: 6-Expresses complex ideas: With extra time/assistive device  Social Interaction Social Interaction: 6-Interacts appropriately with others with medication or extra time (anti-anxiety, antidepressant).  Problem Solving Problem Solving: 6-Solves complex problems: With extra time  Memory Memory: 6-More than reasonable amt of time   Medical Problem List and Plan:  1. Functional deficits secondary to : Left subcortical intracranial hemorrhage with right hemiparesis. Patient on aspirin prior to admission 81 mg daily plan to resume in one to 2 weeks per neurology services  2. DVT Prophylaxis/Anticoagulation: SCDs. Monitor for any signs of DVT  3. Pain Management: Neurontin 100 mg each bedtime, increase to 300mg . Monitor with increased mobility  4. Mood/anxiety: Ativan 2 mg each bedtime.  5. Neuropsych: This patient is capable of making decisions on her own behalf.  6. Hypertension. Tenormin 25 mg twice a day, Lasix 20 mg daily. Monitor with increased mobility  7. Hypothyroidism. Synthroid  8. GERD. Pepcid. No nausea reported  LOS (Days) 2 A FACE TO FACE EVALUATION WAS PERFORMED  Charlett Blake 12/27/2013, 7:21 AM

## 2013-12-27 NOTE — Progress Notes (Signed)
Physical Therapy Session Note  Patient Details  Name: Lisa Kemp MRN: [id number] Date of Birth: 1923-12-27  Today's Date: 12/27/2013 Time: 1345-1430 Time Calculation (min): 45 min  Short Term Goals: Week 1:  PT Short Term Goal 1 (Week 1): Pt will be able to ambulate 100 feet using the LRAD at supervision level to promote increased functional mobility.  PT Short Term Goal 2 (Week 1): Pt will be able to transfer from various level surfaces/heights at supervision level to promote overall funcitonal independence, allowing pt to complete toileting.  PT Short Term Goal 3 (Week 1): Pt will be able to negotiate up/down 2 steps using a unilateral/right railing in order to promote safe entry/exit of the home.  PT Short Term Goal 4 (Week 1): Pt will be able to complete lower extremity exercises, 10-15 reps to promote increased strength and overall functional mobility.   Skilled Therapeutic Interventions/Progress Updates:  Tx focused on functional mobility, Berg Balance assessment, NMR and gait with RW.   Pt propelled WC x150' with S, fatigued once complete. Educated pt on importance of weight-shifting to affected side, providing cues and manual facilitation in sitting and standing for NMR. Pt reports longstanding h/o L shift, leaning, and hip hike.   Administered Berg balance test, see results below and educated pt on findings and functional implications. 30/56 with most difficulty during SLS and turning tasks. Pt needed max A to prevent fall during turn to L to mat. LLE presents with some strength, but decreased muscular endurance, esp during transitional movements.   Assisted pt to bathroom with Min A overall, esp to prevent L LOB during transfer. Pt able to demonstrate dynamic sitting and standing balance with Min A overall during functional tasks.   Gait in controlled setting with RW 2x45' with min A overall for steadying and cues for posture and RW management.   Pt wanting to rest in bed  after tx, all needs in reach.           Therapy Documentation Precautions:  Precautions Precautions: Fall Restrictions Weight Bearing Restrictions: No General:   Vital Signs: Therapy Vitals Temp: 97.3 F (36.3 C) Temp src: Oral Pulse Rate: 80 Resp: 17 BP: 138/83 mmHg Patient Position, if appropriate: Lying Oxygen Therapy SpO2: 97 % O2 Device: None (Room air) Pain: none reported      Locomotion : Ambulation Ambulation/Gait Assistance: 4: Min assist Wheelchair Mobility Distance: 150  Trunk/Postural Assessment :    Balance: Furniture conservator/restorer Sit to Stand: Able to stand using hands after several tries Standing Unsupported: Able to stand 2 minutes with supervision Sitting with Back Unsupported but Feet Supported on Floor or Stool: Able to sit safely and securely 2 minutes Stand to Sit: Uses backs of legs against chair to control descent Transfers: Able to transfer safely, definite need of hands Standing Unsupported with Eyes Closed: Able to stand 10 seconds with supervision Standing Ubsupported with Feet Together: Able to place feet together independently but unable to hold for 30 seconds From Standing, Reach Forward with Outstretched Arm: Can reach confidently >25 cm (10") From Standing Position, Pick up Object from Floor: Able to pick up shoe, needs supervision From Standing Position, Turn to Look Behind Over each Shoulder: Needs assist to keep from losing balance and falling Turn 360 Degrees: Able to turn 360 degrees safely but slowly Standing Unsupported, Alternately Place Feet on Step/Stool: Able to complete >2 steps/needs minimal assist Standing Unsupported, One Foot in Front: Loses balance while stepping or standing Standing on  One Leg: Tries to lift leg/unable to hold 3 seconds but remains standing independently Total Score: 30  See FIM for current functional status  Therapy/Group: Individual Therapy  Soundra Pilon 12/27/2013, 3:47 PM

## 2013-12-27 NOTE — IPOC Note (Signed)
Overall Plan of Care [facility]) Patient Details Name: Lisa Kemp MRN: [id number] DOB: [birth year]  Admitting Diagnosis: L ICH  Hospital Problems: Active Problems:   SDH (subdural hematoma)     Functional Problem List: Nursing Edema;Endurance;Medication Management;Motor;Safety  PT Balance;Endurance;Motor;Pain;Safety;Sensory  OT Balance;Endurance;Motor;Safety  SLP    TR         Basic ADL's: OT Grooming;Bathing;Dressing;Toileting     Advanced  ADL's: OT Simple Meal Preparation;Laundry     Transfers: PT Bed Mobility;Car;Furniture;Floor;Bed to Chair  OT Toilet;Tub/Shower     Locomotion: PT Ambulation;Stairs     Additional Impairments: OT Fuctional Use of Upper Extremity  SLP        TR      Anticipated Outcomes Item Anticipated Outcome  Self Feeding independent  Swallowing      Basic self-care  mod I   Toileting  mod I    Bathroom Transfers mod I to toilet; supervision with tub transfers  Bowel/Bladder  Remain continent of bowel and bladder independently  Transfers  Modified Independent  Locomotion  Modified Indepenent  Communication     Cognition     Pain  Maintains pain at a 0, but no greater than 3  Safety/Judgment  Mod I   Therapy Plan: PT Intensity: Minimum of 1-2 x/day ,45 to 90 minutes PT Frequency: 5 out of 7 days PT Duration Estimated Length of Stay: 12- 14 days OT Intensity: Minimum of 1-2 x/day, 45 to 90 minutes OT Frequency: 5 out of 7 days OT Duration/Estimated Length of Stay: 12-14         Team Interventions: Nursing Interventions Patient/Family Education;Psychosocial Support;Disease Management/Prevention;Medication Management  PT interventions Ambulation/gait training;Balance/vestibular training;Cognitive remediation/compensation;Community reintegration;Discharge planning;Disease management/prevention;DME/adaptive equipment instruction;Functional electrical stimulation;Functional mobility training;Neuromuscular  re-education;Pain management;Patient/family education;Psychosocial support;Splinting/orthotics;Stair training;Therapeutic Activities;Therapeutic Exercise;UE/LE Strength taining/ROM;UE/LE Coordination activities;Visual/perceptual remediation/compensation;Wheelchair propulsion/positioning  OT Interventions Publishing copy;Functional mobility training;Self Care/advanced ADL retraining;Therapeutic Activities;Therapeutic Exercise;UE/LE Strength taining/ROM;UE/LE Coordination activities;Neuromuscular re-education;Patient/family education  SLP Interventions    TR Interventions    SW/CM Interventions      Team Discharge Planning: Destination: PT-Home ,OT- Home , SLP-  Projected Follow-up: PT-Home health PT, OT-  Home health OT, SLP-  Projected Equipment Needs: PT-Rolling walker with 5" wheels (or possibly single point cane. TBD), OT-  , SLP-  Equipment Details: PT-Pt has a shower seat and older SPC that belonged to her mother. , OT-  Patient/family involved in discharge planning: PT- Patient,  OT-Patient, SLP-   MD ELOS: 12-14d Medical Rehab Prognosis:  Good Assessment: 80 y.o. right-handed female with history of hypertension. Patient was independent prior to admission living alone and still working part time. Admitted 12/22/2013 with right-sided weakness upon transfer [facility]. Cranial CT scan at outside hospital showed left internal capsule hemorrhage 7 x 9 mm. A followup Cranial CT scan showed a 1.2 x 0.7 cm focal intraparenchymal hemorrhage at the left posterior basal ganglia did MRI showed small subacute hemorrhage left hemisphere white matter slightly increased in size since prior study of 12/22/2013 with mild edema   Now requiring 24/7 Rehab RN,MD, as well as CIR level PT, OT and SLP.  Treatment team will focus on ADLs and mobility with goals set at Mod I    See Team Conference Notes for weekly updates to the plan of  care

## 2013-12-28 ENCOUNTER — Inpatient Hospital Stay (HOSPITAL_COMMUNITY): Payer: Medicare Other | Admitting: *Deleted

## 2013-12-28 ENCOUNTER — Inpatient Hospital Stay (HOSPITAL_COMMUNITY): Payer: Medicare Other

## 2013-12-28 ENCOUNTER — Encounter (HOSPITAL_COMMUNITY): Payer: Medicare Other

## 2013-12-28 DIAGNOSIS — F341 Dysthymic disorder: Secondary | ICD-10-CM

## 2013-12-28 DIAGNOSIS — I6330 Cerebral infarction due to thrombosis of unspecified cerebral artery: Secondary | ICD-10-CM

## 2013-12-28 DIAGNOSIS — I10 Essential (primary) hypertension: Secondary | ICD-10-CM

## 2013-12-28 LAB — CBC WITH DIFFERENTIAL/PLATELET
Basophils Absolute: 0.1 10*3/uL (ref 0.0–0.1)
Basophils Relative: 1 % (ref 0–1)
EOS ABS: 0.4 10*3/uL (ref 0.0–0.7)
EOS PCT: 6 % — AB (ref 0–5)
HEMATOCRIT: 38 % (ref 36.0–46.0)
Hemoglobin: 12.8 g/dL (ref 12.0–15.0)
Lymphocytes Relative: 21 % (ref 12–46)
Lymphs Abs: 1.3 10*3/uL (ref 0.7–4.0)
MCH: 31.2 pg (ref 26.0–34.0)
MCHC: 33.7 g/dL (ref 30.0–36.0)
MCV: 92.7 fL (ref 78.0–100.0)
MONOS PCT: 12 % (ref 3–12)
Monocytes Absolute: 0.7 10*3/uL (ref 0.1–1.0)
Neutro Abs: 3.6 10*3/uL (ref 1.7–7.7)
Neutrophils Relative %: 60 % (ref 43–77)
Platelets: 235 10*3/uL (ref 150–400)
RBC: 4.1 MIL/uL (ref 3.87–5.11)
RDW: 13 % (ref 11.5–15.5)
WBC: 6 10*3/uL (ref 4.0–10.5)

## 2013-12-28 LAB — COMPREHENSIVE METABOLIC PANEL
ALK PHOS: 52 U/L (ref 39–117)
ALT: 14 U/L (ref 0–35)
AST: 15 U/L (ref 0–37)
Albumin: 3.5 g/dL (ref 3.5–5.2)
BUN: 25 mg/dL — AB (ref 6–23)
CALCIUM: 9.6 mg/dL (ref 8.4–10.5)
CO2: 28 meq/L (ref 19–32)
Chloride: 97 mEq/L (ref 96–112)
Creatinine, Ser: 0.94 mg/dL (ref 0.50–1.10)
GFR calc non Af Amer: 53 mL/min — ABNORMAL LOW (ref >90)
GFR, EST AFRICAN AMERICAN: 61 mL/min — AB (ref >90)
Glucose, Bld: 90 mg/dL (ref 70–99)
Potassium: 4.3 mEq/L (ref 3.7–5.3)
Sodium: 136 mEq/L — ABNORMAL LOW (ref 137–147)
TOTAL PROTEIN: 6.5 g/dL (ref 6.0–8.3)
Total Bilirubin: 0.3 mg/dL (ref 0.3–1.2)

## 2013-12-28 MED ORDER — FERROUS SULFATE 325 (65 FE) MG PO TABS
325.0000 mg | ORAL_TABLET | Freq: Every day | ORAL | Status: DC
  Administered 2013-12-29: 325 mg via ORAL
  Filled 2013-12-28 (×5): qty 1

## 2013-12-28 MED ORDER — DICLOFENAC SODIUM 1 % TD GEL
2.0000 g | Freq: Three times a day (TID) | TRANSDERMAL | Status: DC
  Administered 2013-12-28 – 2014-01-01 (×12): 2 g via TOPICAL
  Filled 2013-12-28: qty 100

## 2013-12-28 NOTE — Progress Notes (Addendum)
Physical Therapy Session Note  Patient Details  Name: Lisa Kemp MRN: [id number] Date of Birth: March 04, 1924  Today's Date: 12/28/2013 Time: 3664-4034 Time Calculation (min): 25 min  Short Term Goals: Week 1:  PT Short Term Goal 1 (Week 1): STGs=LTGs secondary to ELOS PT Short Term Goal 2 (Week 1): Pt will be able to transfer from various level surfaces/heights at supervision level to promote overall funcitonal independence, allowing pt to complete toileting.  PT Short Term Goal 2 - Progress (Week 1): Discontinued (comment) (STGs=LTGs due to ELOS) PT Short Term Goal 3 (Week 1): Pt will be able to negotiate up/down 2 steps using a unilateral/right railing in order to promote safe entry/exit of the home.  PT Short Term Goal 3 - Progress (Week 1): Discontinued (comment) (STGs=LTGs due to ELOS) PT Short Term Goal 4 (Week 1): Pt will be able to complete lower extremity exercises, 10-15 reps to promote increased strength and overall functional mobility. PT Short Term Goal 4 - Progress (Week 1): Discontinued (comment) (STGs=LTGs due to ELOS)  Skilled Therapeutic Interventions/Progress Updates:  1:1. Pt received standing at sink with nurse tech in room, PT taking over. Pt req mod cueing for engagement in session due distractibility from objects in room. Focus this session on ambulation for functional endurance and standing balance. Pt with excellent tolerance to amb room<>therapy gym w/ RW and (S), cues for posture. Pt simulated task of feeding her dog to target standing balance with decreased B UE support from RW, req close(S)-min guard A. Pt supine in bed at end of session w/ all needs in reach, bed alarm on.   Therapy Documentation Precautions:  Precautions Precautions: Fall Restrictions Weight Bearing Restrictions: No  See FIM for current functional status  Therapy/Group: Individual Therapy  Gilmore Laroche 12/28/2013, 5:01 PM

## 2013-12-28 NOTE — Progress Notes (Signed)
Physical Therapy Session Note  Patient Details  Name: Lisa Kemp MRN: [id number] Date of Birth: 1924/04/02  Today's Date: 12/28/2013 Time: 0930-1030 and 5176-1607 Time Calculation (min): 60 min and 45 min  Short Term Goals: Week 1:  PT Short Term Goal 1 (Week 1): STGs=LTGs secondary to ELOS  Skilled Therapeutic Interventions/Progress Updates:    AM Session: Patient received in bathroom, from nurse tech. Session focused on functional transfers, gait training, and stair negotiation. Wheelchair mobility >150' with supervision and B UE for activity tolerance. Gait training in controlled environment 174' x1 and 51' x1 with RW and minA, progressing to close supervision. Stair negotiation x5 steps with B handrails and minA, verbal cues for sequencing. Patient requires constant redirection to task throughout session secondary to excessive talking. Patient returned to room and left seated in recliner with all needs within reach.  PM Session: Patient received sitting in recliner. Session focused on functional transfers, gait training, and dynamic standing balance. Gait training in controlled environment 120' x1 with RW and supervision, verbal cues for attention to task to limit distractability. Standing horseshoe toss x3 rounds with horseshoes positioned low and outside of patient's BOS to R, L, and center with supervision. Curb negotiation x2 with RW and minA, verbal cues for sequencing and visual demonstration prior. NuStep Level 2 x7' with B UE/LE for general strengthening/activity tolerance. Patient returned to room and left supine in bed with all needs within reach and bed alarm on.  Therapy Documentation Precautions:  Precautions Precautions: Fall Restrictions Weight Bearing Restrictions: No Pain: Pain Assessment Pain Assessment: No/denies pain Pain Score: 0-No pain Locomotion : Ambulation Ambulation/Gait Assistance: 4: Min assist Wheelchair Mobility Distance: 150   See FIM for  current functional status  Therapy/Group: Individual Therapy  Lillia Abed. Anayla Giannetti, PT, DPT 12/28/2013, 11:59 AM

## 2013-12-28 NOTE — Progress Notes (Signed)
Physical Medicine and Rehabilitation Consult  Reason for Consult: Left corona radiata hemorrhage  Referring Physician: Dr. Leonie Man  HPI: Lisa Kemp is a 80 y.o. right-handed female with history of hypertension. Patient was independent prior to admission living alone and still working part time. Admitted 12/22/2013 with right-sided weakness upon transfer from [facility]. Cranial CT scan at outside hospital showed left internal capsule hemorrhage 7 x 9 mm. A followup Cranial CT scan showed a 1.2 x 0.7 cm focal intraparenchymal hemorrhage at the left posterior basal ganglia did MRI showed small subacute hemorrhage left hemisphere white matter slightly increased in size since prior study of 12/22/2013 with mild edema without significant mass effect. MRA of the brain negative. Echocardiogram ejection fraction of 70% no wall motion abnormalities. Neurology service followup advise conservative care. Noted accelerated hypertension 174/105 on admission and monitored. Patient had been on aspirin prior to admission advise to hold and resume in one to 2 weeks. Physical therapy evaluation completed 12/24/2013 with recommendations for physical medicine rehabilitation consult.  Review of Systems  Gastrointestinal:  GERD  Musculoskeletal: Positive for joint pain and myalgias.  Psychiatric/Behavioral:  Anxiety  All other systems reviewed and are negative.   Past Medical History   Diagnosis  Date   .  GERD (gastroesophageal reflux disease)    .  Arthritis    .  Hypothyroidism    .  Hypertension     Past Surgical History   Procedure  Laterality  Date   .  Colon surgery      History reviewed. No pertinent family history.  Social History: reports that she has never smoked. She does not have any smokeless tobacco history on file. She reports that she does not drink alcohol or use illicit drugs.  Allergies:  Allergies   Allergen  Reactions   .  Diazepam  Other (See Comments)     Could not walk     Medications Prior to Admission   Medication  Sig  Dispense  Refill   .  aspirin 81 MG tablet  Take 81 mg by mouth daily.     [name]  atenolol (TENORMIN) 25 MG tablet  Take 25 mg by mouth 2 (two) times daily.     .  ferrous sulfate 325 (65 FE) MG tablet  Take 325 mg by mouth daily with breakfast.     .  furosemide (LASIX) 20 MG tablet  Take 20 mg by mouth daily.     [name]  gabapentin (NEURONTIN) 100 MG capsule  Take 100 mg by mouth at bedtime.     [name]  levothyroxine (SYNTHROID, LEVOTHROID) 112 MCG tablet  Take 112 mcg by mouth daily before breakfast.     .  LORazepam (ATIVAN) 2 MG tablet  Take 2 mg by mouth every 6 (six) hours as needed for anxiety.     .  Magnesium 500 MG CAPS  Take 500 mg by mouth every evening.     .  potassium chloride (K-DUR) 10 MEQ tablet  Take 10 mEq by mouth daily.     .  ranitidine (ZANTAC) 150 MG tablet  Take 150 mg by mouth 2 (two) times daily.      Home:  Home Living  Family/patient expects to be discharged to:: Private residence  Living Arrangements: Alone  Available Help at Discharge: Family (grandson lives "across the driveway")  Type of Home: House  Home Access: Stairs to enter  Technical brewer of Steps: 1  Entrance Stairs-Rails: None  Home Layout: One  level  Home Equipment: Walker - standard;Cane - single point  Lives With: Alone  Functional History:  Prior Function  Level of Independence: Independent  Functional Status:  Mobility:  Bed Mobility  Overal bed mobility: Needs Assistance  Bed Mobility: Supine to Sit  Supine to sit: Supervision  General bed mobility comments: increased time to perform  Transfers  Overall transfer level: Needs assistance  Equipment used: Rolling walker (2 wheeled);1 person hand held assist  Transfers: Sit to/from Stand  Sit to Stand: Min assist  General transfer comment: Min assist for stability and to keep from losing balance posteriorly with HHA, min assist/min guard with use of RW. VCs for safe hand placement,  poor control of descent into chair  Ambulation/Gait  Ambulation/Gait assistance: Min assist (occasional Moderate assist with 1 person HHA, 2 LOB noted)  Ambulation Distance (Feet): 24 Feet (27ft to the chair, 88ft to toilet, 14ft to chair from toilet)  Assistive device: Rolling walker (2 wheeled);1 person hand held assist  Gait Pattern/deviations: Decreased stride length;Step-through pattern;Trunk flexed;Drifts right/left  Gait velocity: decreased  Gait velocity interpretation: <1.8 ft/sec, indicative of risk for recurrent falls  General Gait Details: patient unsteady with gait. Attempted ambulation without device using 1 person HHA and patient was barely able to tolerate ambulation safely without moderate assistance and had 2 noted LOB. Provided patient with RW and patient was asked to ambulate again, still required minimal assist for stability. (Patient was independent with a cane PTA).Patient also became very fatigued and had increased DOE during minimal ambulation and activity. SpO2 >95% throughout but DOE 3/4   ADL:   Cognition:  Cognition  Overall Cognitive Status: No family/caregiver present to determine baseline cognitive functioning  Arousal/Alertness: Awake/alert  Orientation Level: Oriented X4  Attention: Sustained  Sustained Attention: Appears intact  Memory: (overall functional, ?mild impairment)  Awareness: Appears intact  Problem Solving: Appears intact  Safety/Judgment: Appears intact  Cognition  Arousal/Alertness: Awake/alert  Behavior During Therapy: WFL for tasks assessed/performed  Overall Cognitive Status: No family/caregiver present to determine baseline cognitive functioning  Area of Impairment: Safety/judgement;Problem solving  Safety/Judgement: Decreased awareness of safety;Decreased awareness of deficits  Problem Solving: Slow processing  General Comments: patient continues to state that her poor balance is related to "having milk and cereal today" and not  because of the bleed she had in her brain or the weakness on her right side  Blood pressure 141/68, pulse 88, temperature 97.9 F (36.6 C), temperature source Oral, resp. rate 20, height 5\' 2"  (1.575 m), weight 70.2 kg (154 lb 12.2 oz), SpO2 98.00%.  Physical Exam  Vitals reviewed.  Constitutional: She is oriented to person, place, and time.  HENT:  Head: Normocephalic.  Eyes: EOM are normal.  Neck: Normal range of motion. Neck supple. No thyromegaly present.  Cardiovascular: Normal rate and regular rhythm.  Respiratory: Effort normal and breath sounds normal. No respiratory distress.  GI: Soft. Bowel sounds are normal. She exhibits no distension.  Neurological: She is alert and oriented to person, place, and time.  Follows full commands  Skin: Skin is warm and dry.  3 minus/5 right deltoid, 3+ right biceps triceps grip  5/5 left deltoid, bicep, tricep, grip  /5 right hip flexor knee extensor ankle dorsiflexor and plantar flexor  5/5 left hip flexor knee extensor ankle dysfunction plantarflexion  Sensation mildly reduced to light touch in the right hand. Intact in the right lower extremity and on the left side.  No evidence of ataxia on cerebellar testing  Musculoskeletal valgus deformity right greater than left knee  No results found for this or any previous visit (from the past 24 hour(s)).  Ct Head Wo Contrast  12/24/2013 CLINICAL DATA: Follow-up intraparenchymal hemorrhage. EXAM: CT HEAD WITHOUT CONTRAST TECHNIQUE: Contiguous axial images were obtained from the base of the skull through the vertex without intravenous contrast. COMPARISON: CT of the head performed 12/22/2013, and MRI of the brain performed 12/23/2016 FINDINGS: The small 1.2 x 0.7 cm focal intraparenchymal hemorrhage at the left posterior basal ganglia is relatively stable in size from recent prior MRI. Mild associated vasogenic edema is again seen. As before, this extends nearly to the left lateral ventricle, without  evidence of intraventricular bleed. No significant midline shift is seen. Mild scattered periventricular and subcortical white matter change likely reflects small vessel ischemic microangiopathy. The posterior fossa, including the cerebellum, brainstem and fourth ventricle, is within normal limits. The third and lateral ventricles are unremarkable in appearance. The cerebral hemispheres demonstrate grossly normal gray-white differentiation. There is no evidence of fracture; visualized osseous structures are unremarkable in appearance. The visualized portions of the orbits are within normal limits. The paranasal sinuses and mastoid air cells are well-aerated. No significant soft tissue abnormalities are seen. IMPRESSION: 1. 1.2 x 0.7 cm focal intraparenchymal hemorrhage at the left posterior basal ganglia is relatively stable in size from recent prior MRI, though mildly increased in size from prior CT. Mild associated vasogenic edema again seen. No evidence of midline shift. The location remains most compatible with a hypertensive bleed. 2. No evidence of acute infarct. 3. Scattered small vessel ischemic microangiopathy. Electronically Signed By: Garald Balding M.D. On: 12/24/2013 04:58  Mr Virgel Paling Wo Contrast  12/23/2013 CLINICAL DATA: 80 year old female with sudden onset inability to use the right upper extremity, right face numbness. Initial encounter. EXAM: MRI HEAD WITHOUT CONTRAST MRA HEAD WITHOUT CONTRAST TECHNIQUE: Multiplanar, multiecho pulse sequences of the brain and surrounding structures were obtained without intravenous contrast. Angiographic images of the head were obtained using MRA technique without contrast. COMPARISON: [facility] CT without contrast 12/22/2013. FINDINGS: MRI HEAD FINDINGS Susceptibility artifact and signal abnormality associated with blood products at the left corona radiata tracking toward the posterior limb of the left internal capsule. This corresponds to  the intracranial hemorrhage identified on 12/22/2013. The area of signal abnormality has mildly increased since that time, now up to 13 mm diameter (previously 9 mm). The blood products are T2 hyperintense and T1 hypo to isointense. Mild surrounding edema. No significant mass effect. No larger area of underlying restricted diffusion. No other acute or chronic intracranial hemorrhage identified. Major intracranial vascular flow voids are preserved. Additional scattered cerebral white matter T2 and FLAIR hyperintensity in a nonspecific pattern. No cortical encephalomalacia identified. No ventriculomegaly. Negative basilar cisterns. Negative pituitary, cervicomedullary junction and visualized cervical spine. Normal bone marrow signal. Postoperative changes to the globes. Visualized paranasal sinuses and mastoids are clear. Visible internal auditory structures appear normal. Visualized scalp soft tissues are within normal limits. MRA HEAD FINDINGS Study is mildly degraded by motion artifact despite repeated imaging attempts. Antegrade flow in the posterior circulation with mildly dominant distal right vertebral artery. Patent PICA origins. Patent vertebrobasilar junction. No basilar stenosis. SCA and PCA origins are normal. Posterior communicating arteries are present. Bilateral PCA branches are within normal limits. Antegrade flow in both ICA siphons. No ICA stenosis. Ophthalmic and posterior communicating artery origins are normal. Normal carotid termini, MCA and ACA origins. Mild irregularity and stenosis of the left ACA  A1 segment. Anterior communicating artery within normal limits. Azygos proximal ACA configuration incidentally noted. Bilateral ACA branches within normal limits. Visualized bilateral MCA branches are patent with mild irregularity. M1 segments are within normal limits (incidental early branching on the right). IMPRESSION: 1. Solitary small subacute hemorrhage in the left hemisphere white matter is  slightly increased in size since 12/22/2013 (estimated volume 1-2 mL). Mild edema and no significant mass effect. 2. Negative for age intracranial MRA. Electronically Signed By: Lars Pinks M.D. On: 12/23/2013 13:18  Mr Brain Wo Contrast  12/23/2013 CLINICAL DATA: 80 year old female with sudden onset inability to use the right upper extremity, right face numbness. Initial encounter. EXAM: MRI HEAD WITHOUT CONTRAST MRA HEAD WITHOUT CONTRAST TECHNIQUE: Multiplanar, multiecho pulse sequences of the brain and surrounding structures were obtained without intravenous contrast. Angiographic images of the head were obtained using MRA technique without contrast. COMPARISON: [facility] CT without contrast 12/22/2013. FINDINGS: MRI HEAD FINDINGS Susceptibility artifact and signal abnormality associated with blood products at the left corona radiata tracking toward the posterior limb of the left internal capsule. This corresponds to the intracranial hemorrhage identified on 12/22/2013. The area of signal abnormality has mildly increased since that time, now up to 13 mm diameter (previously 9 mm). The blood products are T2 hyperintense and T1 hypo to isointense. Mild surrounding edema. No significant mass effect. No larger area of underlying restricted diffusion. No other acute or chronic intracranial hemorrhage identified. Major intracranial vascular flow voids are preserved. Additional scattered cerebral white matter T2 and FLAIR hyperintensity in a nonspecific pattern. No cortical encephalomalacia identified. No ventriculomegaly. Negative basilar cisterns. Negative pituitary, cervicomedullary junction and visualized cervical spine. Normal bone marrow signal. Postoperative changes to the globes. Visualized paranasal sinuses and mastoids are clear. Visible internal auditory structures appear normal. Visualized scalp soft tissues are within normal limits. MRA HEAD FINDINGS Study is mildly degraded by motion  artifact despite repeated imaging attempts. Antegrade flow in the posterior circulation with mildly dominant distal right vertebral artery. Patent PICA origins. Patent vertebrobasilar junction. No basilar stenosis. SCA and PCA origins are normal. Posterior communicating arteries are present. Bilateral PCA branches are within normal limits. Antegrade flow in both ICA siphons. No ICA stenosis. Ophthalmic and posterior communicating artery origins are normal. Normal carotid termini, MCA and ACA origins. Mild irregularity and stenosis of the left ACA A1 segment. Anterior communicating artery within normal limits. Azygos proximal ACA configuration incidentally noted. Bilateral ACA branches within normal limits. Visualized bilateral MCA branches are patent with mild irregularity. M1 segments are within normal limits (incidental early branching on the right). IMPRESSION: 1. Solitary small subacute hemorrhage in the left hemisphere white matter is slightly increased in size since 12/22/2013 (estimated volume 1-2 mL). Mild edema and no significant mass effect. 2. Negative for age intracranial MRA. Electronically Signed By: Lars Pinks M.D. On: 12/23/2013 13:18   Assessment/Plan:  Diagnosis: Left subcortical intracranial hemorrhage with right hemiparesis  1. Does the need for close, 24 hr/day medical supervision in concert with the patient's rehab needs make it unreasonable for this patient to be served in a less intensive setting? Yes 2. Co-Morbidities requiring supervision/potential complications: HTN,Diffuse osteoarthritis in hands and knees 3. Due to safety, skin/wound care, disease management, medication administration, pain management and patient education, does the patient require 24 hr/day rehab nursing? Yes 4. Does the patient require coordinated care of a physician, rehab nurse, PT (1-2 hrs/day, 5 days/week) and OT (1-2 hrs/day, 5 days/week) to address physical and functional deficits in the  context of the above  medical diagnosis(es)? Yes Addressing deficits in the following areas: balance, endurance, locomotion, strength, transferring, bathing, dressing and toileting 5. Can the patient actively participate in an intensive therapy program of at least 3 hrs of therapy per day at least 5 days per week? Yes 6. The potential for patient to make measurable gains while on inpatient rehab is good 7. Anticipated functional outcomes upon discharge from inpatient rehab are modified independent with PT, modified independent with OT, n/a with SLP. 8. Estimated rehab length of stay to reach the above functional goals is: 10-12 days 9. Does the patient have adequate social supports to accommodate these discharge functional goals? Yes 10. Anticipated D/C setting: Home 11. Anticipated post D/C treatments: [facility] therapy 12. Overall Rehab/Functional Prognosis: excellent RECOMMENDATIONS:  This patient's condition is appropriate for continued rehabilitative care in the following setting: CIR  Patient has agreed to participate in recommended program. Yes  Note that insurance prior authorization may be required for reimbursement for recommended care.  Comment:  12/24/2013  Revision History...      Date/Time User Action    12/24/2013 5:41 PM Lisa Blake, MD Sign    12/24/2013 2:09 PM Cathlyn Parsons, PA-C [place]   View Details Report    Routing History.Marland KitchenMarland Kitchen
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
## 2013-12-28 NOTE — Progress Notes (Signed)
Occupational Therapy Session Note  Patient Details  Name: Lisa Kemp MRN: [id number] Date of Birth: Apr 28, 1924  Today's Date: 12/28/2013 Time: 0830-0930 Time Calculation (min): 60 min  Short Term Goals: Week 1:  OT Short Term Goal 1 (Week 1): LTG= STG  Skilled Therapeutic Interventions/Progress Updates:    Pt seen for ADL retraining with focus on functional mobility, dynamic standing balance, and LB self-care tasks. Pt received supine in bed requesting to go to bathroom. Ambulated with min assist using RW bed>toilet. Pt completed bathing at shower level requiring min assist for washing RLE. Educated on crossover technique. Pt reporting having a LH brush at home and declined using a LH sponge here. Pt completed dressing sitting EOB with increased time for buttoning front buttons and for threading BLEs. Encouraged crossover technique however pt declining stating "I know how to do this." Pt completed oral care in standing at min guard-supervision. Discussed home setup and discharge planning. Pt left sitting in w/c with all needs in reach.   Therapy Documentation Precautions:  Precautions Precautions: Fall Restrictions Weight Bearing Restrictions: No General:   Vital Signs:   Pain: Pain Assessment Pain Assessment: No/denies pain Pain Score: 0-No pain  See FIM for current functional status  Therapy/Group: Individual Therapy  Valen Mascaro N Lainie Daubert 12/28/2013, 12:18 PM

## 2013-12-28 NOTE — Progress Notes (Signed)
Patient information reviewed and entered into eRehab system by Kailea Dannemiller, RN, CRRN, PPS Coordinator.  Information including medical coding and functional independence measure will be reviewed and updated through discharge.     Per nursing patient was given "Data Collection Information Summary for Patients in Inpatient Rehabilitation Facilities with attached "Privacy Act Statement-Health Care Records" upon admission.  

## 2013-12-28 NOTE — Progress Notes (Signed)
[place] PHYSICAL MEDICINE & REHABILITATION     PROGRESS NOTE    Subjective/Complaints: Complaining about SCD's. Both legs hurt, particularly the knees.  Doesn't like the food  Objective: Vital Signs: Blood pressure 119/65, pulse 67, temperature 97.2 F (36.2 C), temperature source Oral, resp. rate 18, height 5\' 2"  (1.575 m), weight 69.4 kg (153 lb), SpO2 95.00%. No results found.  Recent Labs  12/28/13 0645  WBC 6.0  HGB 12.8  HCT 38.0  PLT 235   No results found for this basename: NA, K, CL, CO, GLUCOSE, BUN, CREATININE, CALCIUM,  in the last 72 hours CBG (last 3)  No results found for this basename: GLUCAP,  in the last 72 hours  Wt Readings from Last 3 Encounters:  12/25/13 69.4 kg (153 lb)  12/22/13 70.2 kg (154 lb 12.2 oz)  07/09/13 66.86 kg (147 lb 6.4 oz)    Physical Exam:  Constitutional: She is oriented to person, place, and time.  HENT: oral mucosa pink and moist Head: Normocephalic.  Eyes: EOM are normal.  Neck: Normal range of motion. Neck supple. No thyromegaly present.  Cardiovascular: Normal rate and regular rhythm. No murmurs Respiratory: Effort normal and breath sounds normal. No respiratory distress.  GI: Soft. Bowel sounds are normal. She exhibits no distension.  Neurological: She is alert and oriented to person, place, and time.  Follows full commands  Skin: Skin is warm and dry.  3 minus/5 right deltoid, 3+ right biceps triceps grip  5/5 left deltoid, bicep, tricep, grip  3+/5 right hip flexor knee extensor ankle dorsiflexor and plantar flexor  5/5 left hip flexor knee extensor ankle dysfunction plantarflexion  Sensation mildly reduced to light touch in the right hand. Intact in the right lower extremity and on the left side.  No evidence of ataxia on cerebellar testing  Musculoskeletal valgus deformity right greater than left knee. Right knee tender Psych: a little irritable/impulsive   Assessment/Plan: 1. Functional deficits secondary  to left subcortical ICH with right hemiparesis which require 3+ hours per day of interdisciplinary therapy in a comprehensive inpatient rehab setting. Physiatrist is providing close team supervision and 24 hour management of active medical problems listed below. Physiatrist and rehab team continue to assess barriers to discharge/monitor patient progress toward functional and medical goals. FIM: FIM - Bathing Bathing Steps Patient Completed: Chest;Right Arm;Left Arm;Abdomen;Front perineal area;Buttocks;Right upper leg;Left upper leg;Left lower leg (including foot) Bathing: 4: Min-Patient completes 8-9 63f 10 parts or 75+ percent  FIM - Upper Body Dressing/Undressing Upper body dressing/undressing: 0: Wears gown/pajamas-no public clothing FIM - Lower Body Dressing/Undressing Lower body dressing/undressing: 0: Wears gown/pajamas-no public clothing  FIM - Toileting Toileting steps completed by patient: Adjust clothing prior to toileting;Performs perineal hygiene;Adjust clothing after toileting Toileting Assistive Devices: Grab bar or rail for support Toileting: 4: Steadying assist  FIM - Radio producer Devices: Bedside commode;Grab bars;Walker Toilet Transfers: 4-To toilet/BSC: Min A (steadying Pt. > 75%);4-From toilet/BSC: Min A (steadying Pt. > 75%)  FIM - Control and instrumentation engineer Devices: Walker;Arm rests Bed/Chair Transfer: 4: Bed > Chair or W/C: Min A (steadying Pt. > 75%);2: Chair or W/C > Bed: Max A (lift and lower assist) (Assist to prevent fall)  FIM - Locomotion: Wheelchair Distance: 150 Locomotion: Wheelchair: 5: Travels 150 ft or more: maneuvers on rugs and over door sills with supervision, cueing or coaxing FIM - Locomotion: Ambulation Locomotion: Ambulation Assistive Devices: Administrator Ambulation/Gait Assistance: 4: Min assist Locomotion: Ambulation: 2: Travels 50 -  149 ft with minimal assistance  (Pt.>75%)  Comprehension Comprehension Mode: Auditory Comprehension: 6-Follows complex conversation/direction: With extra time/assistive device  Expression Expression Mode: Verbal Expression: 6-Expresses complex ideas: With extra time/assistive device  Social Interaction Social Interaction: 6-Interacts appropriately with others with medication or extra time (anti-anxiety, antidepressant).  Problem Solving Problem Solving: 6-Solves complex problems: With extra time  Memory Memory: 6-More than reasonable amt of time   Medical Problem List and Plan:  1. Functional deficits secondary to : Left subcortical intracranial hemorrhage with right hemiparesis. Patient on aspirin prior to admission 81 mg daily plan to resume in one to 2 weeks per neurology services  2. DVT Prophylaxis/Anticoagulation: SCDs. ambulation  3. Pain Management: Neurontin 100 mg each bedtime, increase to 300mg .   -voltaren gel for knees 4. Mood/anxiety: Ativan 2 mg each bedtime.  5. Neuropsych: This patient is capable of making decisions on her own behalf.  6. Hypertension. Tenormin 25 mg twice a day, Lasix 20 mg daily. Monitor with increased mobility  7. Hypothyroidism. Synthroid  8. GERD. Pepcid---wants zantac instead (home med)   LOS (Days) 3 A FACE TO FACE EVALUATION WAS PERFORMED  Meredith Staggers 12/28/2013 7:32 AM

## 2013-12-29 ENCOUNTER — Inpatient Hospital Stay (HOSPITAL_COMMUNITY): Payer: Self-pay

## 2013-12-29 ENCOUNTER — Inpatient Hospital Stay (HOSPITAL_COMMUNITY): Payer: Medicare Other | Admitting: *Deleted

## 2013-12-29 ENCOUNTER — Encounter (HOSPITAL_COMMUNITY): Payer: Self-pay

## 2013-12-29 NOTE — Progress Notes (Signed)
Occupational Therapy Session Note  Patient Details  Name: Lisa Kemp MRN: [id number] Date of Birth: 1924/01/14  Today's Date: 12/29/2013 Time: [phone] and 1132-1202 Time Calculation (min): 54 min and 30 min   Short Term Goals: Week 1:  OT Short Term Goal 1 (Week 1): LTG= STG  Skilled Therapeutic Interventions/Progress Updates:    Session 1: Pt seen for ADL retraining with focus on functional mobility, dynamic standing balance, and functional transfers. Pt received supine in bed and agreeable to bathing in tub shower this AM. Discussed safety with using TTB vs shower chair and pt agreeable that she felt much safer using TTB. Pt required min assist for stand pivot transfers w/c<>TTB secondary to pt fatigued and stating "it takes me awhile to wake up in the morning." Pt completed all other transfers and functional mobility in bathroom and room at supervision level with RW. Pt completed bathing at supervision level using lateral lean technique for peri hygiene and crossover technique for lower BLE. Completed dressing while sitting in w/c requiring increased time for fastening front buttons. Pt returned to room and left in w/c with breakfast and all needs in reach.   Session 2: Pt seen for 1:1 OT session with focus on dynamic standing balance, functional mobility, and discharge planning. Pt received sitting in w/c. Provided walker bag to assist with transporting items safely at home. Pt ambulated to nurses station, reaching into overhead cabinets and out of BOS to prepare drink. Pt required min cues for problem solving on transporting item. Pt ambulated to kitchen at supervision level with RW. Discussed home setup and preparing dog food at home. Pt practiced transferring items counter to counter and using grocery bag to transport dog food. At end of session pt returned to room and left with all needs in reach.   Therapy Documentation Precautions:  Precautions Precautions:  Fall Restrictions Weight Bearing Restrictions: No General: General Amount of Missed OT Time (min): 6 Minutes Vital Signs: Therapy Vitals Temp: 98 F (36.7 C) Temp src: Oral Pulse Rate: 70 Resp: 18 BP: 140/62 mmHg Patient Position, if appropriate: Lying Oxygen Therapy SpO2: 97 % Pain: No report of pain during therapy sessions.   See FIM for current functional status  Therapy/Group: Individual Therapy  Lisa Kemp N Michale Emmerich 12/29/2013, 8:24 AM

## 2013-12-29 NOTE — Progress Notes (Signed)
Physical Therapy Session Note  Patient Details  Name: Lisa Kemp MRN: [id number] Date of Birth: March 23, 1924  Today's Date: 12/29/2013 Time: 5573-2202 and 5427-0623 Time Calculation (min): 50 min and 45 min  Short Term Goals: Week 1:  PT Short Term Goal 1 (Week 1): STGs=LTGs secondary to ELOS  Skilled Therapeutic Interventions/Progress Updates:    AM Session: Patient received sitting in wheelchair, eating breakfast. Patient adamant about finishing breakfast before beginning therapy. Therapist returned 10 minutes later to begin therapy session, patient missing 10 minutes of session. Session focused on functional transfers/ambulation, stair negotiation, and dynamic standing balance with emphasis on RW management. Functional ambulation >150' x2 with RW and supervision. Obstacle course x2 stepping over several obstacles, weaving in/out of cones, across floor mat, and alternating toe taps on cone; emphasis on RW safety/management, patient performed with close supervision. Repeated sit<>stand transfers with emphasis on proper hand placement and slow, controlled descent from stand>sit to improve B eccentric quad control. Patient returned to room and left seated in recliner with all needs within reach.  PM Session: Patient received sitting in wheelchair. Session focused on functional ambulation outside on uneven surfaces and community ambulation in hospital lobby and gift shop. Patient supervision overall with RW >300' x1 outside on uneven surfaces (inclines, declines, brick, concrete, etc.) and >150' x1 around gift shop, requiring negotiation of obstacles and people in confined spaces of gift shop. Patient returned to room and left semi-reclined in bed with bed alarm on and all needs within reach.  Therapy Documentation Precautions:  Precautions Precautions: Fall Restrictions Weight Bearing Restrictions: No General: Amount of Missed PT Time (min): 10 Minutes Missed Time Reason: Other (comment)  (eating breakfast) Pain: Pain Assessment Pain Assessment: No/denies pain Pain Score: 0-No pain Locomotion : Ambulation Ambulation/Gait Assistance: 5: Supervision   See FIM for current functional status  Therapy/Group: Individual Therapy  Lillia Abed. Tinea Nobile, PT, DPT 12/29/2013, 11:42 AM

## 2013-12-29 NOTE — Progress Notes (Signed)
[facility] PHYSICAL MEDICINE & REHABILITATION     PROGRESS NOTE    Subjective/Complaints: Still waking up. Not a "morning person".  Doesn't like the food  Objective: Vital Signs: Blood pressure 140/62, pulse 70, temperature 98 F (36.7 C), temperature source Oral, resp. rate 18, height 5\' 2"  (1.575 m), weight 69.4 kg (153 lb), SpO2 97.00%. No results found.  Recent Labs  12/28/13 0645  WBC 6.0  HGB 12.8  HCT 38.0  PLT 235    Recent Labs  12/28/13 0645  NA 136*  K 4.3  CL 97  GLUCOSE 90  BUN 25*  CREATININE 0.94  CALCIUM 9.6   CBG (last 3)  No results found for this basename: GLUCAP,  in the last 72 hours  Wt Readings from Last 3 Encounters:  12/25/13 69.4 kg (153 lb)  12/22/13 70.2 kg (154 lb 12.2 oz)  07/09/13 66.86 kg (147 lb 6.4 oz)    Physical Exam:  Constitutional: She is oriented to person, place, and time.  HENT: oral mucosa pink and moist Head: Normocephalic.  Eyes: EOM are normal.  Neck: Normal range of motion. Neck supple. No thyromegaly present.  Cardiovascular: Normal rate and regular rhythm. No murmurs Respiratory: Effort normal and breath sounds normal. No respiratory distress.  GI: Soft. Bowel sounds are normal. She exhibits no distension.  Neurological: She is alert and oriented to person, place, and time.  Follows full commands  Skin: Skin is warm and dry.  3 minus/5 right deltoid, 3+ right biceps triceps grip  5/5 left deltoid, bicep, tricep, grip  4-/5 right hip flexor knee extensor ankle dorsiflexor and plantar flexor  5/5 left hip flexor knee extensor ankle dysfunction plantarflexion  Sensation mildly reduced to light touch in the right hand. Intact in the right lower extremity and on the left side.  No evidence of ataxia on cerebellar testing  Musculoskeletal valgus deformity right greater than left knee. Right knee tender Psych: a little irritable/impulsive   Assessment/Plan: 1. Functional deficits secondary to left  subcortical ICH with right hemiparesis which require 3+ hours per day of interdisciplinary therapy in a comprehensive inpatient rehab setting. Physiatrist is providing close team supervision and 24 hour management of active medical problems listed below. Physiatrist and rehab team continue to assess barriers to discharge/monitor patient progress toward functional and medical goals.  FIM: FIM - Bathing Bathing Steps Patient Completed: Chest;Right Arm;Left Arm;Abdomen;Front perineal area;Buttocks;Right upper leg;Left upper leg;Left lower leg (including foot) Bathing: 4: Min-Patient completes 8-9 83f 10 parts or 75+ percent  FIM - Upper Body Dressing/Undressing Upper body dressing/undressing steps patient completed: Button/unbutton shirt;Pull shirt around back of front closure shirt/dress;Thread/unthread left sleeve of front closure shirt/dress;Thread/unthread right sleeve of front closure shirt/dress Upper body dressing/undressing: 5: Set-up assist to: Obtain clothing/put away FIM - Lower Body Dressing/Undressing Lower body dressing/undressing steps patient completed: Thread/unthread right underwear leg;Thread/unthread left underwear leg;Pull underwear up/down;Thread/unthread right pants leg;Thread/unthread left pants leg;Pull pants up/down;Don/Doff left shoe;Don/Doff right shoe Lower body dressing/undressing: 4: Steadying Assist  FIM - Toileting Toileting steps completed by patient: Adjust clothing prior to toileting;Performs perineal hygiene;Adjust clothing after toileting Toileting Assistive Devices: Grab bar or rail for support Toileting: 4: Steadying assist  FIM - Radio producer Devices: Grab bars;Walker Toilet Transfers: 4-To toilet/BSC: Min A (steadying Pt. > 75%);4-From toilet/BSC: Min A (steadying Pt. > 75%)  FIM - Control and instrumentation engineer Devices: Walker;Arm rests Bed/Chair Transfer: 5: Sit > Supine: Supervision (verbal cues/safety  issues);4: Bed > Chair or W/C:  Min A (steadying Pt. > 75%);4: Chair or W/C > Bed: Min A (steadying Pt. > 75%)  FIM - Locomotion: Wheelchair Distance: 150 Locomotion: Wheelchair: 1: Total Assistance/staff pushes wheelchair (Pt<25%) FIM - Locomotion: Ambulation Locomotion: Ambulation Assistive Devices: Administrator Ambulation/Gait Assistance: 5: Supervision Locomotion: Ambulation: 5: Travels 150 ft or more with supervision/safety issues  Comprehension Comprehension Mode: Auditory Comprehension: 6-Follows complex conversation/direction: With extra time/assistive device  Expression Expression Mode: Verbal Expression: 6-Expresses complex ideas: With extra time/assistive device  Social Interaction Social Interaction: 6-Interacts appropriately with others with medication or extra time (anti-anxiety, antidepressant).  Problem Solving Problem Solving: 6-Solves complex problems: With extra time  Memory Memory: 6-More than reasonable amt of time   Medical Problem List and Plan:  1. Functional deficits secondary to : Left subcortical intracranial hemorrhage with right hemiparesis. Patient on aspirin prior to admission 81 mg daily plan to resume in one to 2 weeks per neurology services  2. DVT Prophylaxis/Anticoagulation: SCDs. ambulation  3. Pain Management: Neurontin 100 mg each bedtime, increased to 300mg .   -voltaren gel for knees 4. Mood/anxiety: Ativan 2 mg each bedtime.  5. Neuropsych: This patient is capable of making decisions on her own behalf.  6. Hypertension. Tenormin 25 mg twice a day, Lasix 20 mg daily. Monitor with increased mobility  7. Hypothyroidism. Synthroid  8. GERD. Pepcid---wants zantac instead (home med)--not available in house---stay with pepcid for now   LOS (Days) 4 A FACE TO FACE EVALUATION WAS PERFORMED  Meredith Staggers 12/29/2013 7:55 AM

## 2013-12-30 ENCOUNTER — Inpatient Hospital Stay (HOSPITAL_COMMUNITY): Payer: Medicare Other | Admitting: Occupational Therapy

## 2013-12-30 ENCOUNTER — Inpatient Hospital Stay (HOSPITAL_COMMUNITY): Payer: Self-pay

## 2013-12-30 ENCOUNTER — Inpatient Hospital Stay (HOSPITAL_COMMUNITY): Payer: Medicare Other | Admitting: *Deleted

## 2013-12-30 DIAGNOSIS — I10 Essential (primary) hypertension: Secondary | ICD-10-CM

## 2013-12-30 DIAGNOSIS — F341 Dysthymic disorder: Secondary | ICD-10-CM

## 2013-12-30 DIAGNOSIS — I6330 Cerebral infarction due to thrombosis of unspecified cerebral artery: Secondary | ICD-10-CM

## 2013-12-30 NOTE — Care Management Note (Signed)
[facility] Individual Statement of Services  Patient Name:  Lisa Kemp  Date:  12/28/2013  Welcome to the [facility].  Our goal is to provide you with an individualized program based on your diagnosis and situation, designed to meet your specific needs.  With this comprehensive rehabilitation program, you will be expected to participate in at least 3 hours of rehabilitation therapies Monday-Friday, with modified therapy programming on the weekends.  Your rehabilitation program will include the following services:  Physical Therapy (PT), Occupational Therapy (OT), Speech Therapy (ST), 24 hour per day rehabilitation nursing, Therapeutic Recreaction (TR), Case Management (Social Worker), Rehabilitation Medicine, Nutrition Services and Pharmacy Services  Weekly team conferences will be held on Tuesdays to discuss your progress.  Your Social Worker will talk with you frequently to get your input and to update you on team discussions.  Team conferences with you and your family in attendance may also be held.  Expected length of stay: 10 days  Overall anticipated outcome: modified independent  Depending on your progress and recovery, your program may change. Your Social Worker will coordinate services and will keep you informed of any changes. Your Social Worker's name and contact numbers are listed  below.  The following services may also be recommended but are not provided by the [facility] will be made to provide these services after discharge if needed.  Arrangements include referral to agencies that provide these services.  Your insurance has been verified to be:  Medicare and Combined Your primary doctor is:  Dr. Woody Seller  Pertinent information will be shared with your doctor and your  insurance company.  Social Worker:  [name], [place] or ([phone]   Information discussed with and copy given to patient by: [name], 12/28/2013, 2:03 PM

## 2013-12-30 NOTE — Progress Notes (Signed)
Occupational Therapy Note  Patient Details  Name: Lisa Kemp MRN: [id number] Date of Birth: [birth year] Today's Date: 12/30/2013 .   Time: 5929-2446 Pt denied pain Individual Therapy  Pt engaged in kitchen and home mgmt tasks in ADL apartment.  Pt amb with RW from room to ADL apartment.  Pt practiced retrieving items from kitchen cabinets. Discussed kitchen layout at home and practiced transporting items while using RW.  Pt practiced retrieving clothing items from cabinets in bed room and practiced bed mobility.  Pt completed all tasks at supervision level.  Pt amb with RW back to room and remain seated in recliner with all needs in place. Focus on activity tolerance, safety awareness, home mgmt, functional amb with RW, and discharge planning.   Anselmo Rod Taran Haynesworth 12/30/2013, 3:24 PM

## 2013-12-30 NOTE — Progress Notes (Signed)
Occupational Therapy Session Note  Patient Details  Name: Lisa Kemp MRN: [id number] Date of Birth: 11-Jun-1924  Today's Date: 12/30/2013 Time: 0800-0900 Time Calculation (min): 60 min  Short Term Goals: Week 1:  OT Short Term Goal 1 (Week 1): LTG= STG  Skilled Therapeutic Interventions/Progress Updates:  Patient resting EOB upon arrival.  Engaged in self care retraining to include shower, dressing and grooming.  Focused on walker safety during functional mobility, activity tolerance, pursed lip breathing, sit><stands and safe transfers.  Patient required vcs to use walker bag to transport items and to stabilize on a steady surface instead of the unsteady walker when reaching outside BOS as well as when picking up item from the floor.  Reviewed other options for safely picking up items from the floor.  Voltaren cream applied to bilateral knees and RN informed.  Patient resting in recliner with all items in reach.  Therapy Documentation Precautions:  Precautions Precautions: Fall Restrictions Weight Bearing Restrictions: No Pain: Bilateral knees, not rated, rest, repositioned and Voltaren cream applied. ADL: See FIM for current functional status  Therapy/Group: Individual Therapy  Gaye Pollack 12/30/2013, 9:17 AM

## 2013-12-30 NOTE — Progress Notes (Signed)
Physical Therapy Session Note  Patient Details  Name: Lisa Kemp MRN: [id number] Date of Birth: 08/02/1924  Today's Date: 12/30/2013 Time: 9233-0076 Time Calculation (min): 25 min  Skilled Therapeutic Interventions/Progress Updates:  1:1. Pt received sitting in recliner, ready for therapy. Focus this session on gait training in community environment. Pt able to amb 500'x2 w/ RW, on even/uneven surfaces, over various thresholds (including elevators) at mod(I) level. Pt appropriately seeking chairs in community to rest as needed. Pt sitting EOB at end of session w/ nurse tech taking over.   Therapy Documentation Precautions:  Precautions Precautions: Fall Restrictions Weight Bearing Restrictions: No  See FIM for current functional status  Therapy/Group: Individual Therapy  Gilmore Laroche 12/30/2013, 3:36 PM

## 2013-12-30 NOTE — Progress Notes (Signed)
[facility] PHYSICAL MEDICINE & REHABILITATION     PROGRESS NOTE    Subjective/Complaints: Irritable about numerous issues.  Up sitting eob  Objective: Vital Signs: Blood pressure 109/62, pulse 66, temperature 97.1 F (36.2 C), temperature source Oral, resp. rate 18, height 5\' 2"  (1.575 m), weight 69.4 kg (153 lb), SpO2 97.00%. No results found.  Recent Labs  12/28/13 0645  WBC 6.0  HGB 12.8  HCT 38.0  PLT 235    Recent Labs  12/28/13 0645  NA 136*  K 4.3  CL 97  GLUCOSE 90  BUN 25*  CREATININE 0.94  CALCIUM 9.6   CBG (last 3)  No results found for this basename: GLUCAP,  in the last 72 hours  Wt Readings from Last 3 Encounters:  12/25/13 69.4 kg (153 lb)  12/22/13 70.2 kg (154 lb 12.2 oz)  07/09/13 66.86 kg (147 lb 6.4 oz)    Physical Exam:  Constitutional: She is oriented to person, place, and time.  HENT: oral mucosa pink and moist Head: Normocephalic.  Eyes: EOM are normal.  Neck: Normal range of motion. Neck supple. No thyromegaly present.  Cardiovascular: Normal rate and regular rhythm. No murmurs Respiratory: Effort normal and breath sounds normal. No respiratory distress.  GI: Soft. Bowel sounds are normal. She exhibits no distension.  Neurological: She is alert and oriented to person, place, and time.  Follows full commands  Skin: Skin is warm and dry.  3  /5 right deltoid, 3+ right biceps triceps grip  5/5 left deltoid, bicep, tricep, grip  4-/5 right hip flexor knee extensor ankle dorsiflexor and plantar flexor  5/5 left hip flexor knee extensor ankle dysfunction plantarflexion  Sensation mildly reduced to light touch in the right hand. Intact in the right lower extremity and on the left side.  No evidence of ataxia on cerebellar testing  Musculoskeletal valgus deformity right greater than left knee. Right knee tender Psych: a little irritable/impulsive   Assessment/Plan: 1. Functional deficits secondary to left subcortical ICH with right  hemiparesis which require 3+ hours per day of interdisciplinary therapy in a comprehensive inpatient rehab setting. Physiatrist is providing close team supervision and 24 hour management of active medical problems listed below. Physiatrist and rehab team continue to assess barriers to discharge/monitor patient progress toward functional and medical goals.  FIM: FIM - Bathing Bathing Steps Patient Completed: Chest;Right Arm;Left Arm;Abdomen;Front perineal area;Buttocks;Right upper leg;Left upper leg;Left lower leg (including foot);Right lower leg (including foot) Bathing: 5: Supervision: Safety issues/verbal cues  FIM - Upper Body Dressing/Undressing Upper body dressing/undressing steps patient completed: Button/unbutton shirt;Pull shirt around back of front closure shirt/dress;Thread/unthread left sleeve of front closure shirt/dress;Thread/unthread right sleeve of front closure shirt/dress Upper body dressing/undressing: 5: Set-up assist to: Obtain clothing/put away FIM - Lower Body Dressing/Undressing Lower body dressing/undressing steps patient completed: Thread/unthread right underwear leg;Thread/unthread left underwear leg;Pull underwear up/down;Thread/unthread right pants leg;Thread/unthread left pants leg;Pull pants up/down;Don/Doff left shoe;Don/Doff right shoe Lower body dressing/undressing: 5: Set-up assist to: Obtain clothing  FIM - Toileting Toileting steps completed by patient: Adjust clothing prior to toileting;Performs perineal hygiene;Adjust clothing after toileting Toileting Assistive Devices: Grab bar or rail for support Toileting: 5: Supervision: Safety issues/verbal cues  FIM - Radio producer Devices: Grab bars;Walker Toilet Transfers: 5-To toilet/BSC: Supervision (verbal cues/safety issues);5-From toilet/BSC: Supervision (verbal cues/safety issues)  FIM - Control and instrumentation engineer Devices: Walker;Arm rests Bed/Chair  Transfer: 5: Bed > Chair or W/C: Supervision (verbal cues/safety issues);5: Chair or W/C > Bed: Supervision (  verbal cues/safety issues)  FIM - Locomotion: Wheelchair Distance: 150 Locomotion: Wheelchair: 0: Activity did not occur FIM - Locomotion: Ambulation Locomotion: Ambulation Assistive Devices: Administrator Ambulation/Gait Assistance: 5: Supervision Locomotion: Ambulation: 5: Travels 150 ft or more with supervision/safety issues  Comprehension Comprehension Mode: Auditory Comprehension: 6-Follows complex conversation/direction: With extra time/assistive device  Expression Expression Mode: Verbal Expression: 6-Expresses complex ideas: With extra time/assistive device  Social Interaction Social Interaction: 6-Interacts appropriately with others with medication or extra time (anti-anxiety, antidepressant).  Problem Solving Problem Solving: 6-Solves complex problems: With extra time  Memory Memory: 6-More than reasonable amt of time   Medical Problem List and Plan:  1. Functional deficits secondary to : Left subcortical intracranial hemorrhage with right hemiparesis. Patient on aspirin prior to admission 81 mg daily plan to resume in one to 2 weeks per neurology services  2. DVT Prophylaxis/Anticoagulation: SCDs. ambulation  3. Pain Management: Neurontin 100 mg each bedtime, increased to 300mg .   -voltaren gel for knees 4. Mood/anxiety: Ativan 2 mg each bedtime.  5. Neuropsych: This patient is capable of making decisions on her own behalf.  6. Hypertension. Tenormin 25 mg twice a day, Lasix 20 mg daily. Monitor with increased mobility  7. Hypothyroidism. Synthroid  8. GERD. Pepcid---stay with pepcid for now 9. FEN: encourage fluids. Recheck bmet tomorrow   LOS (Days) [place] EVALUATION WAS PERFORMED  Meredith Staggers 12/30/2013 7:52 AM

## 2013-12-30 NOTE — Patient Care Conference (Signed)
Inpatient RehabilitationTeam Conference and Plan of Care Update Date: 12/29/2013   Time: 3:10 PM    Patient Name: Lisa Kemp      Medical Record Number: [id number]  Date of Birth: Mar 11, 1924 Sex: Female         Room/Bed: 4M06C/4M06C-01 Payor Info: Payor: MEDICARE / Plan: MEDICARE PART A AND B / Product Type: *No Product type* /    Admitting Diagnosis: L ICH  Admit Date/Time:  12/25/2013  4:48 PM Admission Comments: No comment available   Primary Diagnosis:  <principal problem not specified> Principal Problem: <principal problem not specified>  Patient Active Problem List   Diagnosis Date Noted  . SDH (subdural hematoma) 12/25/2013  . Accelerated hypertension 12/23/2013  . Intracerebral hemorrhage 12/22/2013  . ESSENTIAL HYPERTENSION, BENIGN 08/01/2009  . HYPERLIPIDEMIA-MIXED 06/12/2009  . CAD, NATIVE VESSEL 06/12/2009    Expected Discharge Date: Expected Discharge Date: 01/01/14  Team Members Present: Physician leading conference: Dr. Alger Simons Social Worker Present: [name], LCSW Nurse Present: Elliot Cousin, RN PT Present: Melene Plan, Cottie Banda, PT OT Present: Gareth Morgan, OT;Jennifer Tamala Julian, OT;Roanna Epley, COTA SLP Present: Weston Anna, SLP PPS Coordinator present : Daiva Nakayama, RN, CRRN;Becky Alwyn Ren, PT     Current Status/Progress Goal Weekly Team Focus  Medical   left IC hemorrhage. balance/weakness, insiight issues  improve safety, awareness  pain mgt, mood issues, egosupport   Bowel/Bladder   pt continent of bowel and bladder. Pt refusing senna due to loose stools.  remain continent of bowel and bladder independently  hold stool softeners for now   Swallow/Nutrition/ Hydration             ADL's   min assist bathing, steadying assist toilet task and LB dressing, min guard-close supervision functional transfers  Mod I overall  dynamic standing balance, RUE coordination and strengthening, activity tolerance, pt education, functional  transfers, IADLs   Mobility   minA progressing to close supervision with all mobility  mod I  functional mobility, dynamic standing balance, activity tolerance, strengthening, education, safety   Communication             Safety/Cognition/ Behavioral Observations  no noted or reported attempts of unsafe behavior.  Bed alarm in use.         Pain   pt has pain in BL knees- refusing tylenol at this time. Volteran gel ordered  3 or less out of 10  continue volteran gel as ordered   Skin   CDI          Rehab Goals Patient on target to meet rehab goals: Yes *See Care Plan and progress notes for long and short-term goals.  Barriers to Discharge: insight, safety    Possible Resolutions to Barriers:  slowing down, safety ed, adaptive techniques    Discharge Planning/Teaching Needs:  home with intermittent assistance from family      Team Discussion:  Making excellent gains and anticipate reaching a mod i level.  Currently at supervision.  Encouraging her ot ask for help with home home management tasks to decrease safety concerns.    Revisions to Treatment Plan:  None   Continued Need for Acute Rehabilitation Level of Care: The patient requires daily medical management by a physician with specialized training in physical medicine and rehabilitation for the following conditions: Daily direction of a multidisciplinary physical rehabilitation program to ensure safe treatment while eliciting the highest outcome that is of practical value to the patient.: Yes Daily medical management of patient stability for increased  activity during participation in an intensive rehabilitation regime.: Yes Daily analysis of laboratory values and/or radiology reports with any subsequent need for medication adjustment of medical intervention for : Neurological problems;Other  [name] 12/30/2013, 1:55 PM

## 2013-12-30 NOTE — Progress Notes (Signed)
Physical Therapy Session Note  Patient Details  Name: Lisa Kemp MRN: [id number] Date of Birth: [birth year]  Today's Date: 12/30/2013 Time: 1100-1155 Time Calculation (min): 55 min  Short Term Goals: Week 1:  PT Short Term Goal 1 (Week 1): STGs=LTGs secondary to ELOS  Skilled Therapeutic Interventions/Progress Updates:    Patient received from nurse tech exiting the bathroom. Session focused on increasing activity tolerance with all functional mobility. Patient performed gait training in controlled environment >150' x3 and in home environment (carpet in confined spaces) 25' x2 with RW and supervision. Furniture transfers from low cushioned surfaces with RW and supervision. Patient continues to require constant redirection to task secondary to excessive talking. Patient returned to room and left seated in recliner with all needs within reach.  Therapy Documentation Precautions:  Precautions Precautions: Fall Restrictions Weight Bearing Restrictions: No Pain: Pain Assessment Pain Assessment: 0-10 Pain Score: 7  Pain Type: Chronic pain Pain Location: Knee Pain Orientation: Left Pain Descriptors / Indicators: Sore Pain Onset: With Activity Pain Intervention(s): RN made aware;Repositioned;Ambulation/increased activity Multiple Pain Sites: No Locomotion : Ambulation Ambulation/Gait Assistance: 5: Supervision   See FIM for current functional status  Therapy/Group: Individual Therapy  Lisa Kemp, PT, DPT 12/30/2013, 12:04 PM

## 2013-12-30 NOTE — Progress Notes (Signed)
Social Work  Social Work Assessment and Plan  Patient Details  Name: Lisa Kemp MRN: [id number] Date of Birth: January 30, 1924  Today's Date: 12/28/2013  Problem List:  Patient Active Problem List   Diagnosis Date Noted  . SDH (subdural hematoma) 12/25/2013  . Accelerated hypertension 12/23/2013  . Intracerebral hemorrhage 12/22/2013  . ESSENTIAL HYPERTENSION, BENIGN 08/01/2009  . HYPERLIPIDEMIA-MIXED 06/12/2009  . CAD, NATIVE VESSEL 06/12/2009   Past Medical History:  Past Medical History  Diagnosis Date  . Mixed hyperlipidemia     Statin intolerant  . Gastrointestinal bleed     Related to NSAIDs  . Coronary atherosclerosis of native coronary artery     Nonobstructive at catherization 2006  . Hiatal hernia     Moderate sized sliding hiatal hernia  . Esophageal ring     Distal esophageal ring status post dilation  . Colon polyps   . GERD (gastroesophageal reflux disease)   . Arthritis   . Hypothyroidism   . Hypertension    Past Surgical History:  Past Surgical History  Procedure Laterality Date  . Appendectomy    . Cataract extraction    . Total abdominal hysterectomy w/ bilateral salpingoophorectomy    . Total abdominal hysterectomy    . Colon surgery     Social History:  reports that she has never smoked. She does not have any smokeless tobacco history on file. She reports that she does not drink alcohol or use illicit drugs.  Family / Support Systems Marital Status: Widow/Widower How Long?: 16 yrs Patient Roles: Parent;Other (Comment) (Son deceased.  Has dtr-in-law and grandchildren.) Children: son died July 17, 2013;  grandson, Lisa Kemp (great grandson, Lisa Kemp) living next door @ [phone] Other Supports: dtr-in-law, Lisa Kemp @ (C) [phone] - she lives locally and is supportive, however, currenlty providing support to her daughter who is undergoing bone marrow work - up at [employer].    Pt's sister, Lisa Kemp, @ [phone] also local and able to  assist. Anticipated Caregiver: self and family Ability/Limitations of Caregiver: [name] works for [employer] one Psychologist, educational and lives close by.  Dtr-in-law can assist intermittently.  Granddaughter at [facility] with cancer and chemo treatments. Caregiver Availability: Intermittent Family Dynamics: pt very close with all family members but is reluctant to place any "added burden" on her daughter-in-law at this time.  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  Works q other week Mon - Fri 9-4.   Return to Work Plans: pt hopeful she can return to her job - enjoys this work Freight forwarder Issues: None Guardian/Conservator: None - per MD, pt capable of making decisions on her own behalf   Abuse/Neglect Physical Abuse: Denies Verbal Abuse: Denies Sexual Abuse: Denies Exploitation of patient/patient's resources: Denies Self-Neglect: Denies  Emotional Status Pt's affect, behavior adn adjustment status: Pt very talkative and entertaining as she talks about her small town and her friends and church members.  She is very motivated for therapies.  Denies any s/s of depression or anxiety.  Will monitor. Recent Psychosocial Issues: None Pyschiatric History: None Substance Abuse History: None  Patient / Family Perceptions, Expectations & Goals Pt/Family understanding of illness & functional limitations: Pt and family with good understanding of her "stroke... I had a little bleeding up there...".  Good understanding of functional limitations and  need for CIR. Premorbid pt/family roles/activities: Completely independent and working p/t.  Very active at home and in the community. Anticipated changes in roles/activities/participation: little change anticipated if she  reaches her targeted goals of mod i goals Pt/family expectations/goals: Pt hopeful she will be able to return home with only needing intermittent assist of family  [employer]: None Premorbid Home Care/DME Agencies: None Transportation available at discharge: yes Resource referrals recommended: Neuropsychology  Discharge Planning Living Arrangements: [place]: Children;Other relatives;Friends/neighbors;Church/faith community Insurance Resources: Education officer, museum (specify) Financial Resources: Employment;Social Security Financial Screen Referred: No Living Expenses: Own Money Management: Patient Does the patient have any problems obtaining your medications?: No Home Management: pt Patient/Family Preliminary Plans: pt plans to return home with intermittent assist from family Social Work Anticipated Follow Up Needs: HH/OP Expected length of stay: 10 days  Clinical Impression Very pleasant, motivated woman here following small ICH.  Making excellent gains.  Good family support and goals at mod i.  No emotional distress noted.  Will monitor and follow for d/c planning needs.  [name] 12/28/2013, 2:01 PM

## 2013-12-30 NOTE — Progress Notes (Signed)
Social Work Patient ID: Lisa Kemp, female   DOB: [birth year], 80 y.o.   MRN: [id number]  Met with pt yesterday following team conference.  She is aware and agreeable with targeted d/c 5/8 with mod i goals. I have also contacted pt's sister, Ellamae Sia, who is the person who will be picking her up (likely after lunch that day).  Pt very pleased with her recovery overall.  Denies any concerns about d/c.  Will continue to follow.  [name], LCSW

## 2013-12-30 NOTE — Progress Notes (Signed)
Social Work Patient ID: [name], female   DOB: 1924/06/29, 80 y.o.   MRN: [id number]   [name], LCSW Social Worker Signed  Patient Care Conference Service date: 12/30/2013 1:55 PM  Inpatient RehabilitationTeam Conference and Plan of Care Update Date: 12/29/2013   Time: 3:10 PM     Patient Name: Lisa Kemp       Medical Record Number: [id number]   Date of Birth: 1924/01/22 Sex: Female         Room/Bed: 4M06C/4M06C-01 Payor Info: Payor: MEDICARE / Plan: MEDICARE PART A AND B / Product Type: *No Product type* /   Admitting Diagnosis: L ICH   Admit Date/Time:  12/25/2013  4:48 PM Admission Comments: No comment available   Primary Diagnosis:  <principal problem not specified> Principal Problem: <principal problem not specified>    Patient Active Problem List     Diagnosis  Date Noted   .  SDH (subdural hematoma)  12/25/2013   .  Accelerated hypertension  12/23/2013   .  Intracerebral hemorrhage  12/22/2013   .  ESSENTIAL HYPERTENSION, BENIGN  08/01/2009   .  HYPERLIPIDEMIA-MIXED  06/12/2009   .  CAD, NATIVE VESSEL  06/12/2009     Expected Discharge Date: Expected Discharge Date: 01/01/14  Team Members Present: Physician leading conference: Dr. Alger Simons Social Worker Present: [name], LCSW Nurse Present: Elliot Cousin, RN PT Present: Melene Plan, Cottie Banda, PT OT Present: Gareth Morgan, OT;Jennifer Tamala Julian, OT;Roanna Epley, COTA SLP Present: Weston Anna, SLP PPS Coordinator present : Daiva Nakayama, RN, CRRN;Becky Alwyn Ren, PT        Current Status/Progress  Goal  Weekly Team Focus   Medical     left IC hemorrhage. balance/weakness, insiight issues  improve safety, awareness  pain mgt, mood issues, egosupport   Bowel/Bladder     pt continent of bowel and bladder. Pt refusing senna due to loose stools.  remain continent of bowel and bladder independently  hold stool softeners for now   Swallow/Nutrition/ Hydration            ADL's    min assist bathing, steadying assist toilet task and LB dressing, min guard-close supervision functional transfers  Mod I overall  dynamic standing balance, RUE coordination and strengthening, activity tolerance, pt education, functional transfers, IADLs   Mobility     minA progressing to close supervision with all mobility  mod I  functional mobility, dynamic standing balance, activity tolerance, strengthening, education, safety   Communication            Safety/Cognition/ Behavioral Observations    no noted or reported attempts of unsafe behavior. Bed alarm in use.       Pain     pt has pain in BL knees- refusing tylenol at this time. Volteran gel ordered  3 or less out of 10  continue volteran gel as ordered   Skin     CDI        Rehab Goals Patient on target to meet rehab goals: Yes *See Care Plan and progress notes for long and short-term goals.    Barriers to Discharge:  insight, safety      Possible Resolutions to Barriers:    slowing down, safety ed, adaptive techniques      Discharge Planning/Teaching Needs:    home with intermittent assistance from family      Team Discussion:    Making excellent gains and anticipate reaching a mod i level.  Currently at supervision.  Encouraging her  ot ask for help with home home management tasks to decrease safety concerns.     Revisions to Treatment Plan:    None    Continued Need for Acute Rehabilitation Level of Care: The patient requires daily medical management by a physician with specialized training in physical medicine and rehabilitation for the following conditions: Daily direction of a multidisciplinary physical rehabilitation program to ensure safe treatment while eliciting the highest outcome that is of practical value to the patient.: Yes Daily medical management of patient stability for increased activity during participation in an intensive rehabilitation regime.: Yes Daily analysis of laboratory values and/or radiology  reports with any subsequent need for medication adjustment of medical intervention for : Neurological problems;Other  [name] 12/30/2013, 1:55 PM

## 2013-12-31 ENCOUNTER — Inpatient Hospital Stay (HOSPITAL_COMMUNITY): Payer: Medicare Other | Admitting: Occupational Therapy

## 2013-12-31 ENCOUNTER — Inpatient Hospital Stay (HOSPITAL_COMMUNITY): Payer: Medicare Other | Admitting: Physical Therapy

## 2013-12-31 ENCOUNTER — Inpatient Hospital Stay (HOSPITAL_COMMUNITY): Payer: Medicare Other | Admitting: *Deleted

## 2013-12-31 ENCOUNTER — Inpatient Hospital Stay (HOSPITAL_COMMUNITY): Payer: Self-pay

## 2013-12-31 LAB — BASIC METABOLIC PANEL
BUN: 25 mg/dL — ABNORMAL HIGH (ref 6–23)
CALCIUM: 9.3 mg/dL (ref 8.4–10.5)
CHLORIDE: 96 meq/L (ref 96–112)
CO2: 26 meq/L (ref 19–32)
CREATININE: 0.97 mg/dL (ref 0.50–1.10)
GFR calc Af Amer: 59 mL/min — ABNORMAL LOW (ref >90)
GFR calc non Af Amer: 51 mL/min — ABNORMAL LOW (ref >90)
Glucose, Bld: 94 mg/dL (ref 70–99)
Potassium: 4.5 mEq/L (ref 3.7–5.3)
SODIUM: 133 meq/L — AB (ref 137–147)

## 2013-12-31 NOTE — Progress Notes (Signed)
Social Work Discharge Note  The overall goal for the admission was met for:   Discharge location: Yes - home alone with intermittent assist of family  Length of Stay: Yes - 7 days (with d/c 5/8)  Discharge activity level: Yes - modified independent overall  Home/community participation: Yes  Services provided included: MD, RD, PT, OT, SLP, RN, TR, Pharmacy and [place]: Medicare and Private Insurance: Combined  Follow-up services arranged: Home Health: PT, OT via [place], DME: rolling walker, 3n1 commode and tub bench via [facility] and Patient/Family has no preference for HH/DME agencies  Comments (or additional information):  Patient/Family verbalized understanding of follow-up arrangements: Yes  Individual responsible for coordination of the follow-up plan: patient  Confirmed correct DME delivered: [name] 12/31/2013    [name]

## 2013-12-31 NOTE — Plan of Care (Signed)
Problem: RH Floor Transfers Goal: LTG Patient will perform floor transfers w/assist (PT) LTG: Patient will perform floor transfers with assistance (PT).  Outcome: Not Met (add Reason) Patient refused to perform floor transfer secondary to pain in B knees.

## 2013-12-31 NOTE — Progress Notes (Signed)
Physical Therapy Discharge Summary  Patient Details  Name: Lisa Kemp MRN: [id number] Date of Birth: August 06, 1924  Today's Date: 12/31/2013 Time: 1330-1415 and [id number] Time Calculation (min): 45 min and 40 min  Patient has met 8 of 9 long term goals due to improved activity tolerance, improved balance, increased strength, functional use of  right upper extremity and right lower extremity and improved coordination.  Patient to discharge at an ambulatory level Modified Independent.   Patient's care partner unavailable to provide the necessary physical assistance at discharge.  Reasons goals not met: Floor transfer goal not met due to patient refusal to perform floor transfer secondary to pain in B knees.  Recommendation:  Patient will benefit from ongoing skilled PT services in home health setting to continue to advance safe functional mobility, address ongoing impairments in balance, endurance, and functional strength and minimize fall risk.  Equipment: RW  Reasons for discharge: treatment goals met and discharge from hospital  Patient/family agrees with progress made and goals achieved: Yes  Skilled Therapeutic Intervention: Treatment 1: Pt received sitting in recliner, agreeable to therapy. Discharge reevaluation performed and assessed stair negotiation, gait, transfers, bed mobility, and car transfer at overall mod I level and supervision/setup for car transfer. Refer to discharge summary for details. Pt returned to room and left sitting in arm chair with all needs within reach.   Treatment 2: Berg Balance Scale administered with score of 34/56, slightly improved from score of 30/56 upon evaluation. Pt performance may have been impacted by c/o increased back pain after sitting in recliner and reflux with "choking" feeling. Pt required frequent seated prolonged rest breaks between each task. Pt reported RN already aware and left sitting in recliner with LEs elevated and all needs  within reach. Pt with no questions regarding d/c.   PT Discharge Precautions/Restrictions Precautions Precautions: None Precaution Comments: made mod I in room 12/31/13 Restrictions Weight Bearing Restrictions: No Pain Pain Assessment Pain Assessment: 0-10 Pain Score: 7  Pain Type: Chronic pain Pain Location: Knee Pain Orientation: Left Pain Descriptors / Indicators: Aching;Sore Pain Onset: On-going Pain Intervention(s): Repositioned;Ambulation/increased activity Multiple Pain Sites: No Vision/Perception   No changes from baseline Cognition Overall Cognitive Status: Within Functional Limits for tasks assessed Arousal/Alertness: Awake/alert Orientation Level: Oriented X4 Sensation Sensation Light Touch: Appears Intact Proprioception: Appears Intact Additional Comments: Pt reports neuropathy in BLE, with consistent burning in bilateral feet. BLE intact to light touch, proprioception.  Coordination Gross Motor Movements are Fluid and Coordinated: Yes Fine Motor Movements are Fluid and Coordinated: No Finger Nose Finger Test: L side WFL, R side mild impairment.  Motor  Motor Motor: Hemiplegia (RUE/RLE) Motor - Skilled Clinical Observations: mild RUE hemiplegia  Mobility Bed Mobility Bed Mobility: Sit to Supine;Supine to Sit Supine to Sit: 6: Modified independent (Device/Increase time) Sit to Supine: 6: Modified independent (Device/Increase time) Transfers Transfers: Yes Sit to Stand: 6: Modified independent (Device/Increase time) Stand to Sit: 6: Modified independent (Device/Increase time) Stand Pivot Transfers: 6: Modified independent (Device/Increase time) Locomotion  Ambulation Ambulation: Yes Ambulation/Gait Assistance: 6: Modified independent (Device/Increase time) Ambulation Distance (Feet): 150 Feet Assistive device: Rolling walker Gait Gait: No Gait velocity: decreased High Level Ambulation High Level Ambulation: Side stepping;Backwards walking Stairs /  Additional Locomotion Stairs: Yes Stairs Assistance: 6: Modified independent (Device/Increase time) Stair Management Technique: One rail Left;Step to pattern Number of Stairs: 4 Height of Stairs: 6 Wheelchair Mobility Wheelchair Mobility: No (pt ambulatory)  Trunk/Postural Assessment  Cervical Assessment Cervical Assessment: Within Functional Limits Thoracic Assessment  Thoracic Assessment: Exceptions to [facility] Thoracic AROM Overall Thoracic AROM: Due to premorid status (kyphosis) Lumbar Assessment Lumbar Assessment: Within Functional Limits Postural Control Postural Control: Within Functional Limits  Balance Balance Balance Assessed: Yes Standardized Balance Assessment Standardized Balance Assessment: Berg Balance Test Berg Balance Test Sit to Stand: Able to stand  independently using hands Standing Unsupported: Able to stand safely 2 minutes Sitting with Back Unsupported but Feet Supported on Floor or Stool: Able to sit safely and securely 2 minutes Stand to Sit: Controls descent by using hands Transfers: Able to transfer safely, definite need of hands Standing Unsupported with Eyes Closed: Able to stand 10 seconds with supervision Standing Ubsupported with Feet Together: Needs help to attain position but able to stand for 30 seconds with feet together From Standing, Reach Forward with Outstretched Arm: Can reach forward >12 cm safely (5") From Standing Position, Pick up Object from Floor: Able to pick up shoe, needs supervision From Standing Position, Turn to Look Behind Over each Shoulder: Turn sideways only but maintains balance Turn 360 Degrees: Able to turn 360 degrees safely but slowly Standing Unsupported, Alternately Place Feet on Step/Stool: Able to complete >2 steps/needs minimal assist Standing Unsupported, One Foot in Front: Loses balance while stepping or standing Standing on One Leg: Able to lift leg independently and hold equal to or more than 3 seconds Total  Score: 34/56 pt is at high risk of falls Extremity Assessment  RLE Assessment RLE Assessment: Exceptions to [facility] RLE AROM (degrees) Overall AROM Right Lower Extremity: Within functional limits for tasks assessed RLE Strength RLE Overall Strength: Deficits RLE Overall Strength Comments: Overall WFL except R hip flexion 4-/5  LLE Assessment LLE Assessment: Exceptions to [facility] LLE Strength LLE Overall Strength: Deficits LLE Overall Strength Comments: Overall WFL except L hip flexion 4-/5  See FIM for current functional status  Laretta Alstrom 12/31/2013, 2:11 PM

## 2013-12-31 NOTE — Progress Notes (Signed)
Occupational Therapy Note  Patient Details  Name: Lisa Kemp MRN: [id number] Date of Birth: 1924-06-03 Today's Date: 12/31/2013  Time: 2500-3704 Pt denied pain Individual Therapy Pt missed 15 mins therapy for RN care  Pt amb with RW to ADL and engaged in simulated laundry tasks of gathering clothing items and transporting to and from "washer/dryer" and hanging clothing and placing in drawers.  Pt exhibited safe behaviors and appropriate problem solving strategies.  Focus on energy conservation, activity tolerance, safety awareness, and functional amb with RW.   Lisa Kemp 12/31/2013, 3:03 PM

## 2013-12-31 NOTE — Progress Notes (Signed)
Occupational Therapy Session Note  Patient Details  Name: Lisa Kemp MRN: [id number] Date of Birth: [birth year]  Today's Date: 12/31/2013 Time: 1100-1200 Time Calculation (min): 60 min  Short Term Goals: Week 1:  OT Short Term Goal 1 (Week 1): LTG= STG  Skilled Therapeutic Interventions/Progress Updates:  Patient resting in recliner upon arrival.  Engaged in self care retraining to include shower, dressing and grooming.  Focused on demonstrating Mod I with all BADL tasks and supervision with shower transfer.  Patient able to independently steady on stable surface when reaching outside BOS as well as when picking up item from the floor.  Reviewed other options for safely picking up items from the floor.   Patient resting in recliner with all items in reach.  Patient reports she feels that her rehab stay has been beneficial and she is ready for discharge home tomorrow with family to check in on her.  Therapy Documentation Precautions:  Precautions Precautions: None Precaution Comments: made mod I in room 12/31/13 Restrictions Weight Bearing Restrictions: No Pain: Bilateral knees, not rated, rest, repositioned and Voltaren cream applied. ADL: See FIM for current functional status  Therapy/Group: Individual Therapy  Gaye Pollack 12/31/2013, 3:54 PM

## 2013-12-31 NOTE — Progress Notes (Signed)
Physical Therapy Session Note  Patient Details  Name: Lisa Kemp MRN: [id number] Date of Birth: 09/28/1923  Today's Date: 12/31/2013 Time: 1030-1100 Time Calculation (min): 30 min  Short Term Goals: Week 1:  PT Short Term Goal 1 (Week 1): STGs=LTGs secondary to ELOS  Skilled Therapeutic Interventions/Progress Updates:    Patient received sitting in recliner. Session focused on functional ambulation in controlled and home environments, stair negotiation, and furniture transfers. Functional ambulation >150' x2 with RW and mod I in controlled environment, 53' x1 with RW and mod I in ADL apartment to simulate home environment. Furniture transfers on/off low, cushioned sofas and chairs with RW and mod I. Stair/curb negotiation (to simulate one step/threshold into home) with RW and mod I, completed x4 trials; 5 stairs with one handrail and mod I (to simulate other entrance to home).  Patient refused to perform floor transfer secondary to B knee pain. Discussion about falls risk at home and use of cordless phone in RW bag at all times to use if fall occurs (patient stating she does not own cell phone that she could carry on her). Patient verbalized understanding, but stating "I won't fall at home." Continued discussion/education about falls risk and proper measures to take to maximize safety within the home. Ongoing discussions throughout sessions this week about having neighbor or grandson feed dog until HHPT assesses patient's ability to go outside to do so. Patient returned to room and left with all needs; made mod I in room.  Therapy Documentation Precautions:  Precautions Precautions: None Precaution Comments: made mod I in room 12/31/13 Restrictions Weight Bearing Restrictions: No Pain: Pain Assessment Pain Assessment: 0-10 Pain Score: 4  Pain Type: Chronic pain Pain Location: Knee Pain Orientation: Left Pain Descriptors / Indicators: Aching;Sore Pain Onset: On-going Pain  Intervention(s): Repositioned;Ambulation/increased activity (pt states Voltaren gel helps) Multiple Pain Sites: No Locomotion : Ambulation Ambulation/Gait Assistance: 6: Modified independent (Device/Increase time)   See FIM for current functional status  Therapy/Group: Individual Therapy  Lillia Abed. Ahjanae Cassel, PT, DPT 12/31/2013, 1:06 PM

## 2013-12-31 NOTE — Discharge Instructions (Signed)
Inpatient Rehab Discharge Instructions  Lisa Kemp Discharge date and time: No discharge date for patient encounter.   Activities/Precautions/ Functional Status: Activity: activity as tolerated Diet: regular diet Wound Care: none needed Functional status:  ___ No restrictions     ___ Walk up steps independently ___ 24/7 supervision/assistance   ___ Walk up steps with assistance ___ Intermittent supervision/assistance  ___ Bathe/dress independently ___ Walk with walker     ___ Bathe/dress with assistance ___ Walk Independently    ___ Shower independently _x   STROKE/TIA DISCHARGE INSTRUCTIONS SMOKING Cigarette smoking nearly doubles your risk of having a stroke & is the single most alterable risk factor  If you smoke or have smoked in the last 12 months, you are advised to quit smoking for your health.  Most of the excess cardiovascular risk related to smoking disappears within a year of stopping.  Ask you doctor about anti-smoking medications  [place] Quit Line: 1-800-QUIT NOW  Free Smoking Cessation Classes [phone]  CHOLESTEROL Know your levels; limit fat & cholesterol in your diet  Lipid Panel     Component Value Date/Time   CHOL 157 12/23/2013 0228   TRIG 188* 12/23/2013 0228   HDL 35* 12/23/2013 0228   CHOLHDL 4.5 12/23/2013 0228   VLDL 38 12/23/2013 0228   LDLCALC 84 12/23/2013 0228      Many patients benefit from treatment even if their cholesterol is at goal.  Goal: Total Cholesterol (CHOL) less than 160  Goal:  Triglycerides (TRIG) less than 150  Goal:  HDL greater than 40  Goal:  LDL (LDLCALC) less than 100   BLOOD PRESSURE American Stroke Association blood pressure target is less that 120/80 mm/Hg  Your discharge blood pressure is:  BP: 109/62 mmHg  Monitor your blood pressure  Limit your salt and alcohol intake  Many individuals will require more than one medication for high blood pressure  DIABETES (A1c is a blood sugar average for last 3  months) Goal HGBA1c is under 7% (HBGA1c is blood sugar average for last 3 months)  Diabetes: No known diagnosis of diabetes    Lab Results  Component Value Date   HGBA1C 5.5 12/23/2013     Your HGBA1c can be lowered with medications, healthy diet, and exercise.  Check your blood sugar as directed by your physician  Call your physician if you experience unexplained or low blood sugars.  PHYSICAL ACTIVITY/REHABILITATION Goal is 30 minutes at least 4 days per week  Activity: Increase activity slowly, Therapies: Physical Therapy: Home Health Return to work:   Activity decreases your risk of heart attack and stroke and makes your heart stronger.  It helps control your weight and blood pressure; helps you relax and can improve your mood.  Participate in a regular exercise program.  Talk with your doctor about the best form of exercise for you (dancing, walking, swimming, cycling).  DIET/WEIGHT Goal is to maintain a healthy weight  Your discharge diet is: Cardiac  liquids Your height is:  Height: 5\' 2"  (157.5 cm) Your current weight is: Weight: 69.4 kg (153 lb) Your Body Mass Index (BMI) is:  BMI (Calculated): 28  Following the type of diet specifically designed for you will help prevent another stroke.  Your goal weight range is:    Your goal Body Mass Index (BMI) is 19-24.  Healthy food habits can help reduce 3 risk factors for stroke:  High cholesterol, hypertension, and excess weight.  RESOURCES Stroke/Support Group:  Call [phone]   STROKE  EDUCATION PROVIDED/REVIEWED AND GIVEN TO PATIENT Stroke warning signs and symptoms How to activate emergency medical system (call 911). Medications prescribed at discharge. Need for follow-up after discharge. Personal risk factors for stroke. Pneumonia vaccine given:  Flu vaccine given:  My questions have been answered, the writing is legible, and I understand these instructions.  I will adhere to these goals & educational materials  that have been provided to me after my discharge from the hospital.    __ Walk with assistance    ___ Shower with assistance ___ No alcohol     ___ Return to work/school ________    COMMUNITY REFERRALS UPON DISCHARGE:    Home Health:   PT     OT                     Agency: [place] Phone: [place] Equipment/Items Ordered: rolling walker, 3n1 commode and tub bench                                                     Agency/Supplier: [facility] @ [phone]       Special Instructions: Followup with neurology services on plan to resume aspirin therapy   My questions have been answered and I understand these instructions. I will adhere to these goals and the provided educational materials after my discharge from the hospital.  Patient/Caregiver Signature _______________________________ Date __________  Clinician Signature _______________________________________ Date __________  Please bring this form and your medication list with you to all your follow-up doctor's appointments.

## 2013-12-31 NOTE — Progress Notes (Signed)
[facility] PHYSICAL MEDICINE & REHABILITATION     PROGRESS NOTE    Subjective/Complaints: In good spirits today. Excited to get home to her cat and dog,  voltaren gel really helping knees  Objective: Vital Signs: Blood pressure 131/57, pulse 65, temperature 97.8 F (36.6 C), temperature source Oral, resp. rate 19, height 5\' 2"  (1.575 m), weight 69.4 kg (153 lb), SpO2 97.00%. No results found. No results found for this basename: WBC, HGB, HCT, PLT,  in the last 72 hours  Recent Labs  12/31/13 0643  NA 133*  K 4.5  CL 96  GLUCOSE 94  BUN 25*  CREATININE 0.97  CALCIUM 9.3   CBG (last 3)  No results found for this basename: GLUCAP,  in the last 72 hours  Wt Readings from Last 3 Encounters:  12/25/13 69.4 kg (153 lb)  12/22/13 70.2 kg (154 lb 12.2 oz)  07/09/13 66.86 kg (147 lb 6.4 oz)    Physical Exam:  Constitutional: She is oriented to person, place, and time.  HENT: oral mucosa pink and moist Head: Normocephalic.  Eyes: EOM are normal.  Neck: Normal range of motion. Neck supple. No thyromegaly present.  Cardiovascular: Normal rate and regular rhythm. No murmurs Respiratory: Effort normal and breath sounds normal. No respiratory distress.  GI: Soft. Bowel sounds are normal. She exhibits no distension.  Neurological: She is alert and oriented to person, place, and time.  Follows full commands  Skin: Skin is warm and dry.  3  /5 right deltoid, 3+ right biceps triceps grip  5/5 left deltoid, bicep, tricep, grip  4-/5 right hip flexor knee extensor ankle dorsiflexor and plantar flexor  5/5 left hip flexor knee extensor ankle dysfunction plantarflexion  Sensation mildly reduced to light touch in the right hand. Intact in the right lower extremity and on the left side.  No evidence of ataxia on cerebellar testing  Musculoskeletal valgus deformity right greater than left knee. Right knee tender---lesser so today Psych: pleasant   Assessment/Plan: 1. Functional  deficits secondary to left subcortical ICH with right hemiparesis which require 3+ hours per day of interdisciplinary therapy in a comprehensive inpatient rehab setting. Physiatrist is providing close team supervision and 24 hour management of active medical problems listed below. Physiatrist and rehab team continue to assess barriers to discharge/monitor patient progress toward functional and medical goals.  FIM: FIM - Bathing Bathing Steps Patient Completed: Chest;Right Arm;Left Arm;Abdomen;Front perineal area;Buttocks;Right upper leg;Left upper leg;Left lower leg (including foot);Right lower leg (including foot) Bathing: 5: Supervision: Safety issues/verbal cues  FIM - Upper Body Dressing/Undressing Upper body dressing/undressing steps patient completed: Button/unbutton shirt;Pull shirt around back of front closure shirt/dress;Thread/unthread left sleeve of front closure shirt/dress;Thread/unthread right sleeve of front closure shirt/dress Upper body dressing/undressing: 5: Supervision: Safety issues/verbal cues FIM - Lower Body Dressing/Undressing Lower body dressing/undressing steps patient completed: Thread/unthread right underwear leg;Thread/unthread left underwear leg;Pull underwear up/down;Thread/unthread right pants leg;Thread/unthread left pants leg;Pull pants up/down;Don/Doff left shoe;Don/Doff right shoe Lower body dressing/undressing: 5: Supervision: Safety issues/verbal cues  FIM - Toileting Toileting steps completed by patient: Adjust clothing prior to toileting;Performs perineal hygiene;Adjust clothing after toileting Toileting Assistive Devices: Grab bar or rail for support Toileting: 5: Supervision: Safety issues/verbal cues  FIM - Radio producer Devices: Grab bars;Walker;Elevated toilet seat (3 in 1 over commode) Toilet Transfers: 5-To toilet/BSC: Supervision (verbal cues/safety issues);5-From toilet/BSC: Supervision (verbal cues/safety  issues)  FIM - Control and instrumentation engineer Devices: Walker;Arm rests Bed/Chair Transfer: 5: Bed > Chair or  W/C: Supervision (verbal cues/safety issues);5: Chair or W/C > Bed: Supervision (verbal cues/safety issues)  FIM - Locomotion: Wheelchair Distance: 150 Locomotion: Wheelchair: 1: Total Assistance/staff pushes wheelchair (Pt<25%) FIM - Locomotion: Ambulation Locomotion: Ambulation Assistive Devices: Administrator Ambulation/Gait Assistance: 5: Supervision Locomotion: Ambulation: 5: Travels 150 ft or more with supervision/safety issues  Comprehension Comprehension Mode: Auditory Comprehension: 6-Follows complex conversation/direction: With extra time/assistive device  Expression Expression Mode: Verbal Expression: 6-Expresses complex ideas: With extra time/assistive device  Social Interaction Social Interaction: 6-Interacts appropriately with others with medication or extra time (anti-anxiety, antidepressant).  Problem Solving Problem Solving: 6-Solves complex problems: With extra time  Memory Memory: 6-More than reasonable amt of time   Medical Problem List and Plan:  1. Functional deficits secondary to : Left subcortical intracranial hemorrhage with right hemiparesis. Patient on aspirin prior to admission 81 mg daily plan to resume in one to 2 weeks per neurology services  2. DVT Prophylaxis/Anticoagulation: SCDs. ambulation  3. Pain Management: Neurontin 100 mg each bedtime, increased to 300mg .   -voltaren gel for knees--pt really likes this 4. Mood/anxiety: Ativan 2 mg each bedtime.  5. Neuropsych: This patient is capable of making decisions on her own behalf.  6. Hypertension. Tenormin 25 mg twice a day, Lasix 20 mg daily. Monitor with increased mobility  7. Hypothyroidism. Synthroid  8. GERD. Pepcid---stay with pepcid for now 9. FEN: encouraging fluids. bmet unchanged today   LOS (Days) 6 A FACE TO FACE EVALUATION WAS PERFORMED  Meredith Staggers 12/31/2013 8:07 AM

## 2013-12-31 NOTE — Discharge Summary (Signed)
Discharge summary job [id number] [phone]

## 2013-12-31 NOTE — Discharge Summary (Signed)
Lisa Kemp, Lisa Kemp NO.:  [id number]  MEDICAL RECORD NO.:  [id number]  LOCATION:  4M06C                        FACILITY:  [facility]  PHYSICIAN:  Meredith Staggers, M.D.DATE OF BIRTH:  [birth year]  DATE OF ADMISSION:  12/25/2013 DATE OF DISCHARGE:  01/01/2014                              DISCHARGE SUMMARY   DISCHARGE DIAGNOSES: 1. Functional deficits secondary to left subcortical intracranial     hemorrhage with right hemiparesis. 2. Sequential compression devices for deep vein thrombosis     prophylaxis, pain management. 3. Anxiety. 4. Hypertension. 5. Hypothyroidism. 6. Gastroesophageal reflux disease.  HISTORY OF PRESENT ILLNESS:  This is an 80 year old right-handed female with history of hypertension, who was independent prior to admission living alone and still working part-time.  Admitted on December 22, 2013, with right-sided weakness upon transfer from [facility].  Cranial CT scan at outside hospital showed left internal capsule hemorrhage 7 x 9 mm.  A followup cranial CT scan showed a 1.2 x 0.7 cm focal intraparenchymal hemorrhage at the left posterior basal ganglia with MRI showing small subacute hemorrhage left hemisphere white matter, slightly increased in size from prior study on December 22, 2013.  Mild edema without significant mass effect.  MRA of the brain negative. Echocardiogram with ejection fraction of 70%.  No wall motion abnormalities.  Neurology Service followup, advised conservative care. Noted accelerated hypertension on admission and monitored.  The patient had been on aspirin prior to admission, advised to hold and resume in 1- 2 weeks.  Physical and occupational therapy on going.  The patient was admitted for comprehensive rehab program.  PAST MEDICAL HISTORY:  See discharge diagnoses.  SOCIAL HISTORY:  Lives alone.  Works part time.  FUNCTIONAL HISTORY:  Prior to admission, independent.  Functional status upon admission to  rehab services was needing assist for overall transfers, minimal assist stand pivot transfers, ambulating minimal assist with a rolling walker, min to mod assist for basic self-care.  PHYSICAL EXAMINATION:  VITAL SIGNS:  Blood pressure 103/51, pulse 78, temperature 98.9, respirations 18. GENERAL:  This was an alert female, in no acute distress, oriented x3. She followed full commands. HEENT:  Pupils round and reactive to light. LUNGS:  Clear to auscultation. CARDIAC:  Regular rate and rhythm. ABDOMEN:  Soft, nontender.  Good bowel sounds.  REHABILITATION HOSPITAL COURSE:  The patient was admitted to inpatient rehab services with therapies initiated on a 3-hour daily basis consisting of physical therapy, occupational therapy, and rehabilitation nursing.  The following issues were addressed during the patient's rehabilitation stay.  Pertaining to Lisa Kemp's left subcortical intracranial hemorrhage with right hemiparesis remained stable, followed conservatively by Neurology Services, Dr. Leonie Man.  The patient had been on aspirin 81 mg daily prior to admission and advised to hold for 1-2 weeks and resume at the discretion of Neurology Services after follow up.  She continued on Neurontin 100 mg at bedtime for pain management as well as Voltaren gel for her knees.  She was using Ativan at bedtime for anxiety with good results, emotional support provided.  Her blood pressures were well controlled on Tenormin and low-dose Lasix with no orthostatic changes.  She denied any chest pain or shortness of breath. She  continued on Synthroid for hypothyroidism.  The patient received weekly collaborative interdisciplinary team conferences to discuss estimated length of stay, family teaching, and any barriers to discharge.  She was ambulating 500 feet x2 with a rolling walker on even and uneven surfaces.  Strength and endurance continued to improve. Ambulated with a rolling walker from room to the  activities of daily living apartment, practice retrieving items from kitchen cabinets. Discuss kitchen layout at home and practice transporting items while using a rolling walker.  She was independent for bathing and dressing. She was advised no driving.  Full teaching was completed and plan was to be discharged to home with ongoing therapies dictated as per rehab services.  DISCHARGE MEDICATIONS: 1. Tenormin 25 mg p.o. b.i.d. 2. Voltaren gel, apply 3 times daily as needed to affected areas. 3. Pepcid 20 mg p.o. daily. 4. Ferrous sulfate 325 mg p.o. daily. 5. Lasix 20 mg p.o. daily. 6. Neurontin 100 mg p.o. at bedtime. 7. Synthroid 100 mcg p.o. daily. 8. Ativan 2 mg p.o. at bedtime. 9. Potassium chloride 10 mEq p.o. daily.  DIET:  Regular.  SPECIAL INSTRUCTIONS:  The patient would follow up with Dr. Alger Simons at the outpatient rehab service office on February 17, 2014; Dr. Antony Contras, Neurology Services, call for appointment; Dr. Woody Seller, medical management.  Special instructions, hold aspirin therapy until follow up with Neurology Services on plan to resume.  The patient also advised no driving.     Lauraine Rinne, P.A.   ______________________________ Meredith Staggers, M.D.    DA/MEDQ  D:  12/31/2013  T:  12/31/2013  Job:  [id number]  cc:   Pramod P. Leonie Man, MD Jerene Bears, MD

## 2014-01-01 ENCOUNTER — Inpatient Hospital Stay (HOSPITAL_COMMUNITY): Payer: Medicare Other

## 2014-01-01 MED ORDER — RANITIDINE HCL 150 MG PO TABS: 150.0000 mg | ORAL_TABLET | Freq: Two times a day (BID) | ORAL | Status: DC

## 2014-01-01 MED ORDER — GABAPENTIN 100 MG PO CAPS: 100.0000 mg | ORAL_CAPSULE | Freq: Every day | ORAL | Status: DC

## 2014-01-01 MED ORDER — POTASSIUM CHLORIDE ER 10 MEQ PO TBCR: 10.0000 meq | EXTENDED_RELEASE_TABLET | Freq: Every day | ORAL | Status: DC | PRN

## 2014-01-01 MED ORDER — IRON 325 (65 FE) MG PO TABS: 1.0000 | ORAL_TABLET | Freq: Every day | ORAL | Status: DC

## 2014-01-01 MED ORDER — LORAZEPAM 2 MG PO TABS: 2.0000 mg | ORAL_TABLET | Freq: Every day | ORAL | Status: DC

## 2014-01-01 MED ORDER — DICLOFENAC SODIUM 1 % TD GEL: 2.0000 g | Freq: Three times a day (TID) | TRANSDERMAL | Status: DC

## 2014-01-01 MED ORDER — ATENOLOL 25 MG PO TABS: 25.0000 mg | ORAL_TABLET | Freq: Two times a day (BID) | ORAL | Status: DC

## 2014-01-01 MED ORDER — FUROSEMIDE 20 MG PO TABS: 20.0000 mg | ORAL_TABLET | Freq: Every day | ORAL | Status: DC

## 2014-01-01 MED ORDER — LEVOTHYROXINE SODIUM 112 MCG PO TABS: 112.0000 ug | ORAL_TABLET | Freq: Every day | ORAL | Status: DC

## 2014-01-01 NOTE — Progress Notes (Signed)
Lisa Kemp PHYSICAL MEDICINE & REHABILITATION     PROGRESS NOTE    Subjective/Complaints: Excited to be going home. Pleased with progress   Objective: Vital Signs: Blood pressure 142/55, pulse 71, temperature 97.7 F (36.5 C), temperature source Oral, resp. rate 18, height 5\' 2"  (1.575 m), weight 69.4 kg (153 lb), SpO2 100.00%. No results found. No results found for this basename: WBC, HGB, HCT, PLT,  in the last 72 hours  Recent Labs  12/31/13 0643  NA 133*  K 4.5  CL 96  GLUCOSE 94  BUN 25*  CREATININE 0.97  CALCIUM 9.3   CBG (last 3)  No results found for this basename: GLUCAP,  in the last 72 hours  Wt Readings from Last 3 Encounters:  12/25/13 69.4 kg (153 lb)  12/22/13 70.2 kg (154 lb 12.2 oz)  07/09/13 66.86 kg (147 lb 6.4 oz)    Physical Exam:  Constitutional: She is oriented to person, place, and time.  HENT: oral mucosa pink and moist Head: Normocephalic.  Eyes: EOM are normal.  Neck: Normal range of motion. Neck supple. No thyromegaly present.  Cardiovascular: Normal rate and regular rhythm. No murmurs Respiratory: Effort normal and breath sounds normal. No respiratory distress.  GI: Soft. Bowel sounds are normal. She exhibits no distension.  Neurological: She is alert and oriented to person, place, and time.  Follows full commands  Skin: Skin is warm and dry.  3  /5 right deltoid, 3+ right biceps triceps grip  5/5 left deltoid, bicep, tricep, grip  4-/5 right hip flexor knee extensor ankle dorsiflexor and plantar flexor  5/5 left hip flexor knee extensor ankle dysfunction plantarflexion  Sensation mildly reduced to light touch in the right hand. Intact in the right lower extremity and on the left side.  No evidence of ataxia on cerebellar testing  Musculoskeletal valgus deformity right greater than left knee. Right knee tender---lesser so today Psych: pleasant   Assessment/Plan: 1. Functional deficits secondary to left subcortical ICH with right  hemiparesis which require 3+ hours per day of interdisciplinary therapy in a comprehensive inpatient rehab setting. Physiatrist is providing close team supervision and 24 hour management of active medical problems listed below. Physiatrist and rehab team continue to assess barriers to discharge/monitor patient progress toward functional and medical goals.  FIM: FIM - Bathing Bathing Steps Patient Completed: Chest;Right Arm;Left Arm;Abdomen;Front perineal area;Buttocks;Right upper leg;Left upper leg;Left lower leg (including foot);Right lower leg (including foot) Bathing: 6: More than reasonable amount of time  FIM - Upper Body Dressing/Undressing Upper body dressing/undressing steps patient completed: Button/unbutton shirt;Pull shirt around back of front closure shirt/dress;Thread/unthread left sleeve of front closure shirt/dress;Thread/unthread right sleeve of front closure shirt/dress Upper body dressing/undressing: 6: More than reasonable amount of time FIM - Lower Body Dressing/Undressing Lower body dressing/undressing steps patient completed: Thread/unthread right underwear leg;Thread/unthread left underwear leg;Pull underwear up/down;Thread/unthread right pants leg;Thread/unthread left pants leg;Pull pants up/down;Don/Doff left shoe;Don/Doff right shoe Lower body dressing/undressing: 6: More than reasonable amount of time  FIM - Toileting Toileting steps completed by patient: Adjust clothing prior to toileting;Performs perineal hygiene;Adjust clothing after toileting Toileting Assistive Devices: Grab bar or rail for support Toileting: 7: Independent: No helper, no device  FIM - Radio producer Devices: Grab bars;Walker;Elevated toilet seat Toilet Transfers: 6-More than reasonable amt of time  FIM - Control and instrumentation engineer Devices: Copy: 6: More than reasonable amt of time  FIM - Locomotion:  Wheelchair Distance: 150 Locomotion: Wheelchair: 0: Activity did  not occur (pt ambulatory) FIM - Locomotion: Ambulation Locomotion: Ambulation Assistive Devices: Walker - Rolling Ambulation/Gait Assistance: 6: Modified independent (Device/Increase time) Locomotion: Ambulation: 6: Travels 150 ft or more with assistive device/no helper  Comprehension Comprehension Mode: Auditory Comprehension: 6-Follows complex conversation/direction: With extra time/assistive device  Expression Expression Mode: Verbal Expression: 6-Expresses complex ideas: With extra time/assistive device  Social Interaction Social Interaction: 6-Interacts appropriately with others with medication or extra time (anti-anxiety, antidepressant).  Problem Solving Problem Solving: 6-Solves complex problems: With extra time  Memory Memory: 6-More than reasonable amt of time   Medical Problem List and Plan:  1. Functional deficits secondary to : Left subcortical intracranial hemorrhage with right hemiparesis. Patient on aspirin prior to admission 81 mg daily plan to resume in one to 2 weeks per neurology services  2. DVT Prophylaxis/Anticoagulation: SCDs. ambulation  3. Pain Management: Neurontin 100 mg each bedtime, increased to 300mg .   -voltaren gel for knees--WRITE HOME RX 4. Mood/anxiety: Ativan 2 mg each bedtime.  5. Neuropsych: This patient is capable of making decisions on her own behalf.  6. Hypertension. Tenormin 25 mg twice a day, Lasix 20 mg daily. Monitor with increased mobility  7. Hypothyroidism. Synthroid  8. GERD. Pepcid---stay with pepcid for now 9. FEN: encouraging fluids.     LOS (Days) 7 A FACE TO FACE EVALUATION WAS PERFORMED  Lisa Kemp 01/01/2014 8:01 AM

## 2014-01-01 NOTE — Progress Notes (Signed)
Occupational Therapy Discharge Summary  Patient Details  Name: Lisa Kemp MRN: [id number] Date of Birth: 1924/02/02  Today's Date: 01/01/2014 Time:  -     Patient has met 89 of 14 long term goals due to improved activity tolerance, improved balance, postural control, ability to compensate for deficits, functional use of  RIGHT upper and RIGHT lower extremity, improved attention, improved awareness and improved coordination.  Patient to discharge at overall Modified Independent level.  Patient's care partner not necessary to provide the necessary physical and cognitive assistance at discharge. Patient's son providing intermittent supervision PTA and plans do continue doing so after discharge.   Reasons goals not met: N/A. All LTGs met.  Recommendation:  Patient will benefit from ongoing skilled OT services in home health setting to continue to advance functional skills in the area of RUE coordiation, dynamic balance, safety awareness, functional use of RUE.  Equipment: TTB, BSC  Reasons for discharge: treatment goals met and discharge from hospital  Patient/family agrees with progress made and goals achieved: Yes  OT Discharge Precautions/Restrictions  Precautions Precautions: None Precaution Comments: made mod I in room 12/31/13 Restrictions Weight Bearing Restrictions: No General   Vital Signs Therapy Vitals Temp: 97.7 F (36.5 C) Temp src: Oral Pulse Rate: 71 Resp: 18 BP: 142/55 mmHg Patient Position, if appropriate: Lying Oxygen Therapy SpO2: 100 % O2 Device: None (Room air) Pain   ADL   Vision/Perception  Vision- History Baseline Vision/History: Wears glasses Wears Glasses: Reading only Patient Visual Report: No change from baseline Vision- Assessment Vision Assessment?: No apparent visual deficits Eye Alignment: Within Functional Limits Ocular Range of Motion: Within Functional Limits  Cognition Overall Cognitive Status: Within Functional Limits for  tasks assessed Orientation Level: Oriented X4 Attention: Sustained Sustained Attention: Appears intact Memory: Appears intact Awareness: Appears intact Problem Solving: Appears intact Safety/Judgment: Appears intact Sensation Sensation Light Touch: Appears Intact Proprioception: Appears Intact Coordination Gross Motor Movements are Fluid and Coordinated: Yes Fine Motor Movements are Fluid and Coordinated: No Finger Nose Finger Test: mild impairments in RUE Motor    Mobility     Trunk/Postural Assessment     Balance   Extremity/Trunk Assessment RUE Assessment RUE Assessment: Exceptions to WFL (full ROM; mild coordination deficits) LUE Assessment LUE Assessment: Within Functional Limits  See FIM for current functional status  Stefano Trulson N Amahri Dengel 01/01/2014, 7:05 AM

## 2014-01-05 ENCOUNTER — Telehealth: Payer: Self-pay

## 2014-01-05 NOTE — Telephone Encounter (Signed)
FYI::: Debbie(PT @ [facility]) called to inform you that the patient has declined PT all weekend and beginning this week. She said patient states she will "start" PT on Wednesday 5/13.

## 2014-01-06 ENCOUNTER — Ambulatory Visit: Payer: Medicare Other | Admitting: Cardiology

## 2014-01-15 ENCOUNTER — Telehealth: Payer: Self-pay

## 2014-01-15 NOTE — Telephone Encounter (Signed)
Lisa Kemp @ [facility] is requesting to add a Corporate investment banker Therapy to treatment plan. Per protocol gave her the verbal okay to add ST and SW.

## 2014-01-19 DIAGNOSIS — I6200 Nontraumatic subdural hemorrhage, unspecified: Secondary | ICD-10-CM

## 2014-01-19 DIAGNOSIS — I10 Essential (primary) hypertension: Secondary | ICD-10-CM

## 2014-01-19 DIAGNOSIS — I619 Nontraumatic intracerebral hemorrhage, unspecified: Secondary | ICD-10-CM

## 2014-01-19 DIAGNOSIS — E785 Hyperlipidemia, unspecified: Secondary | ICD-10-CM

## 2014-01-19 DIAGNOSIS — I2510 Atherosclerotic heart disease of native coronary artery without angina pectoris: Secondary | ICD-10-CM

## 2014-01-25 ENCOUNTER — Telehealth: Payer: Self-pay

## 2014-01-25 NOTE — Telephone Encounter (Signed)
Ok given to extend therapy. Has appt scheduled with Dr Naaman Plummer 02/17/14

## 2014-01-25 NOTE — Telephone Encounter (Signed)
Debbie a physical therapist with [facility] called to get verbal to extend patients therapy.

## 2014-02-04 ENCOUNTER — Other Ambulatory Visit: Payer: Self-pay | Admitting: *Deleted

## 2014-02-04 MED ORDER — FUROSEMIDE 20 MG PO TABS: 20.0000 mg | ORAL_TABLET | Freq: Every day | ORAL | Status: DC

## 2014-02-17 ENCOUNTER — Encounter: Payer: Self-pay | Admitting: Physical Medicine & Rehabilitation

## 2014-02-17 ENCOUNTER — Encounter: Payer: Medicare Other | Attending: Physical Medicine & Rehabilitation | Admitting: Physical Medicine & Rehabilitation

## 2014-02-17 VITALS — BP 163/61 | HR 83 | Resp 14 | Ht 64.0 in | Wt 147.0 lb

## 2014-02-17 DIAGNOSIS — E782 Mixed hyperlipidemia: Secondary | ICD-10-CM | POA: Insufficient documentation

## 2014-02-17 DIAGNOSIS — M1712 Unilateral primary osteoarthritis, left knee: Secondary | ICD-10-CM

## 2014-02-17 DIAGNOSIS — Z9089 Acquired absence of other organs: Secondary | ICD-10-CM | POA: Insufficient documentation

## 2014-02-17 DIAGNOSIS — I2510 Atherosclerotic heart disease of native coronary artery without angina pectoris: Secondary | ICD-10-CM | POA: Insufficient documentation

## 2014-02-17 DIAGNOSIS — I69959 Hemiplegia and hemiparesis following unspecified cerebrovascular disease affecting unspecified side: Secondary | ICD-10-CM | POA: Insufficient documentation

## 2014-02-17 DIAGNOSIS — S065X9A Traumatic subdural hemorrhage with loss of consciousness of unspecified duration, initial encounter: Secondary | ICD-10-CM

## 2014-02-17 DIAGNOSIS — I619 Nontraumatic intracerebral hemorrhage, unspecified: Secondary | ICD-10-CM

## 2014-02-17 DIAGNOSIS — I618 Other nontraumatic intracerebral hemorrhage: Secondary | ICD-10-CM

## 2014-02-17 DIAGNOSIS — E039 Hypothyroidism, unspecified: Secondary | ICD-10-CM | POA: Insufficient documentation

## 2014-02-17 DIAGNOSIS — Z90710 Acquired absence of both cervix and uterus: Secondary | ICD-10-CM | POA: Insufficient documentation

## 2014-02-17 DIAGNOSIS — G589 Mononeuropathy, unspecified: Secondary | ICD-10-CM | POA: Insufficient documentation

## 2014-02-17 DIAGNOSIS — F411 Generalized anxiety disorder: Secondary | ICD-10-CM | POA: Insufficient documentation

## 2014-02-17 DIAGNOSIS — M1710 Unilateral primary osteoarthritis, unspecified knee: Secondary | ICD-10-CM

## 2014-02-17 DIAGNOSIS — K219 Gastro-esophageal reflux disease without esophagitis: Secondary | ICD-10-CM | POA: Insufficient documentation

## 2014-02-17 DIAGNOSIS — I6200 Nontraumatic subdural hemorrhage, unspecified: Secondary | ICD-10-CM

## 2014-02-17 DIAGNOSIS — S065XAA Traumatic subdural hemorrhage with loss of consciousness status unknown, initial encounter: Secondary | ICD-10-CM

## 2014-02-17 DIAGNOSIS — I10 Essential (primary) hypertension: Secondary | ICD-10-CM | POA: Insufficient documentation

## 2014-02-17 NOTE — Patient Instructions (Signed)
RETURN TO DRIVING PLAN:  WITH THE SUPERVISION OF A LICENSED DRIVER, PLEASE DRIVE IN AN EMPTY PARKING LOT FOR AT LEAST 2-3 TRIALS TO TEST REACTION TIME, VISION, USE OF EQUIPMENT IN CAR, ETC.  IF SUCCESSFUL WITH THE PARKING LOT DRIVING, PROCEED TO SUPERVISED DRIVING TRIALS IN YOUR NEIGHBORHOOD STREETS AT LOW TRAFFIC TIMES TO TEST OBSERVATION TO TRAFFIC SIGNALS, REACTION TIME, ETC. PLEASE ATTEMPT AT LEAST 2-3 TRIALS IN YOUR NEIGHBORHOOD.  IF NEIGHBORHOOD DRIVING IS SUCCESSFUL, YOU MAY PROCEED TO DRIVING IN BUSIER AREAS IN YOUR COMMUNITY WITH SUPERVISION OF A LICENSED DRIVER. PLEASE ATTEMPT AT LEAST 4-5 TRIALS.  IF COMMUNITY DRIVING IS SUCCESSFUL, YOU MAY PROCEED TO DRIVING ALONE, DURING THE DAY TIME, IN NON-PEAK TRAFFIC TIMES. YOU SHOULD DRIVE NO FURTHER THAN 20 MINUTES IN ONE DIRECTION. PLEASE DO NOT DRIVE IF YOU FEEL FATIGUED OR UNDER THE INFLUENCE OF MEDICATION.   

## 2014-02-17 NOTE — Progress Notes (Signed)
Subjective:    Patient ID: Lisa Kemp, female    DOB: 01-21-1924, 80 y.o.   MRN: [id number]  HPI  Lisa Kemp is back regarding her ICH. She has been home for about 6 weeks. HH therapy is working her on balance and strength. She has been walking with therapy in and outside of the house using a straight cane.   She is not having any pain. She is trying to use good safety judgement. She uses her walker when she's alone at home. No falls or problems at home. She sometimes gets a little sore after exercise.   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        Pain Inventory Average Pain 0 Pain Right Now 0 My pain is no pain  In the last 24 hours, has pain interfered with the following? General activity 0 Relation with others 0 Enjoyment of life 0 What TIME of day is your pain at its worst? no pain Sleep (in general) Fair  Pain is worse with: na Pain improves with: na Relief from Meds: no pain meds  Mobility walk with assistance use a cane use a walker ability to climb steps?  yes do you drive?  no transfers alone  Function employed # of hrs/week 30 every other week I need assistance with the following:  shopping Do you have any goals in this area?  yes  Neuro/Psych weakness trouble walking  Prior Studies Any changes since last visit?  no  Physicians involved in your care Any changes since last visit?  no   Family History  Problem Relation Age of Onset  . Hypertension     History   Social History  . Marital Status: Widowed    Spouse Name: N/A    Number of Children: N/A  . Years of Education: N/A   Occupational History  . Retired    Social History Main Topics  . Smoking status: Never Smoker   . Smokeless tobacco: None  . Alcohol Use: No  . Drug Use: No  . Sexual Activity: No   Other Topics Concern   . None   Social History Narrative   ** Merged History Encounter **       Past Surgical History  Procedure Laterality Date  . Appendectomy    . Cataract extraction    . Total abdominal hysterectomy w/ bilateral salpingoophorectomy    . Total abdominal hysterectomy    . Colon surgery     Past Medical History  Diagnosis Date  . Mixed hyperlipidemia     Statin intolerant  . Gastrointestinal bleed     Related to NSAIDs  . Coronary atherosclerosis of native coronary artery     Nonobstructive at catherization 2006  . Hiatal hernia     Moderate sized sliding hiatal hernia  . Esophageal ring     Distal esophageal ring status post dilation  . Colon polyps   . GERD (gastroesophageal reflux disease)   . Arthritis   . Hypothyroidism   . Hypertension    BP 163/61  Pulse 83  Resp 14  Ht 5\' 4"  (1.626 m)  Wt 147 lb (66.679 kg)  BMI 25.22 kg/m2  SpO2 93%  Opioid Risk Score:   Fall Risk Score: Moderate Fall Risk (6-13 points) (pt educated on fall risk, brochure given to pt )  Review of Systems  Musculoskeletal: Positive for gait problem.  Neurological: Positive for weakness.  All other systems reviewed and are negative.      Objective:   Physical Exam  Constitutional: She is oriented to person, place, and time.  HENT: oral mucosa pink and moist  Head: Normocephalic.  Eyes: EOM are normal.  Neck: Normal range of motion. Neck supple. No thyromegaly present.  Cardiovascular: Normal rate and regular rhythm. No murmurs  Respiratory: Effort normal and breath sounds normal. No respiratory distress.  GI: Soft. Bowel sounds are normal. She exhibits no distension.  Neurological: She is alert and oriented to person, place, and time.  Follows full commands  Skin: Skin is warm and dry.  4 /5 right deltoid,4+right biceps triceps grip  5/5 left deltoid, bicep, tricep, grip  4/5 right hip flexor knee extensor ankle dorsiflexor and plantar flexor  5/5 left hip flexor knee  extensor ankle dysfunction plantarflexion  Sensation mildly reduced to light touch in the right hand. Intact in the right lower extremity and on the left side.  No evidence of ataxia on cerebellar testing  Musculoskeletal valgus deformity left knee with recurvatum in standing. Right knee tender---lesser so today  Psych: pleasant   Assessment/Plan:  1.  Left subcortical intracranial hemorrhage with right hemiparesis.   -follow up with neurology next month  -driving instructions were provided---pt agreed to plan    3. Pain Management: Neurontin 100 mg each bedtime for neuropathy -voltaren gel for knees (high co-pay)  -neoprene knee brace with hinges for OA left knee 4. Mood/anxiety: under control.  5. Neuropsych: This patient is capable of making decisions on her own behalf.  6. Hypertension. Tenormin 25 mg twice a day, Lasix 20 mg daily.      See me back prn. 30 minutes of face to face patient care time were spent during this visit. All questions were encouraged and answered.

## 2014-03-04 ENCOUNTER — Telehealth: Payer: Self-pay

## 2014-03-04 NOTE — Telephone Encounter (Signed)
Lisa Kemp(Pt @ [facility]) is requesting a verbal order to re certify patient for 2 x a week for 3 weeks. Attempted to contact Lisa Kemp to give her a verbal. Left a message on a verified voicemail.

## 2014-03-08 ENCOUNTER — Encounter: Payer: Self-pay | Admitting: Neurology

## 2014-03-08 ENCOUNTER — Ambulatory Visit (INDEPENDENT_AMBULATORY_CARE_PROVIDER_SITE_OTHER): Payer: Medicare Other | Admitting: Neurology

## 2014-03-08 VITALS — BP 167/71 | HR 88 | Ht 62.5 in | Wt 142.0 lb

## 2014-03-08 DIAGNOSIS — I612 Nontraumatic intracerebral hemorrhage in hemisphere, unspecified: Secondary | ICD-10-CM

## 2014-03-08 DIAGNOSIS — I619 Nontraumatic intracerebral hemorrhage, unspecified: Secondary | ICD-10-CM

## 2014-03-08 NOTE — Progress Notes (Signed)
NAME: Lisa Kemp DOB: 01-May-1924  STROKE NEUROLOGY FOLLOW UP NOTE  Today we had the pleasure of seeing Lisa Kemp in follow-up at our Neurology Clinic. Pt was accompanied by her sister. Interval history was obtained from patient and her sister.  History Summary 80 yo F with PMH of HTN and hypothyroidism was admitted on 12/22/13 for acute onset mild right hemiparesis with right facial numbness. BP was high at 174/105 at that time. CT head showed small left corona radiata bleeding. MRI did not show brain mass. She was controlled on BP and discharged to inpt rehab. She was discharged on 5/8 to home with home PT/OT follow up. She has not resumed her ASA 81mg  yet.   Interval History Pt was doing well at home and no complain of weakness or numbness. She still has home PT/OT working with her twice a week and she is likely to be discharged from these service soon. Her BP was controlled well at home at 120-130, however, today her BP was high in clinic 167/71, but pt stated that she was very nervous.  She has not started to take ASA 81 mg yet which was her home meds before the admission. She has tried in the past several statins but not able to tolerate due to muscle pain. Her LDL on admission was 84.  Current Outpatient Prescriptions on File Prior to Visit  Medication Sig Dispense Refill  . atenolol (TENORMIN) 25 MG tablet Take 1 tablet (25 mg total) by mouth 2 (two) times daily.  180 tablet  3  . calcium-vitamin D (OSCAL WITH D) 500-200 MG-UNIT per tablet Take 1 tablet by mouth 2 (two) times daily.        . Coenzyme Q10 (COQ-10) 200 MG CAPS Take 1 capsule by mouth daily.        . diclofenac sodium (VOLTAREN) 1 % GEL Apply 2 g topically 3 (three) times daily.  1 Tube  1  . Ferrous Sulfate (IRON) 325 (65 FE) MG TABS Take 1 tablet by mouth daily.  30 each  0  . furosemide (LASIX) 20 MG tablet Take 1 tablet (20 mg total) by mouth daily.  30 tablet  0  . gabapentin (NEURONTIN) 100 MG  capsule Take 1 capsule (100 mg total) by mouth at bedtime.  30 capsule  1  . levothyroxine (SYNTHROID, LEVOTHROID) 112 MCG tablet Take 1 tablet (112 mcg total) by mouth daily.  30 tablet  1  . LORazepam (ATIVAN) 2 MG tablet Take 1 tablet (2 mg total) by mouth at bedtime.  30 tablet  0  . Magnesium 250 MG TABS Take 1 tablet by mouth daily.        . Multiple Vitamin (MULTIVITAMIN) capsule Take 1 capsule by mouth daily.        . nitroGLYCERIN (NITROSTAT) 0.4 MG SL tablet Place 1 tablet (0.4 mg total) under the tongue as directed.  25 tablet  3  . ranitidine (ZANTAC) 150 MG tablet Take 150 mg by mouth 2 (two) times daily.      . ranitidine (ZANTAC) 150 MG tablet Take 1-2 tablets (150-300 mg total) by mouth 2 (two) times daily. 2 tablets in AM and 1 tablet in PM  60 tablet  1   No current facility-administered medications on file prior to visit.    The following represents the patient's updated allergies and side effects list: Allergies  Allergen Reactions  . Diazepam   . Diazepam Other (See Comments)    Could not  walk  . Statins     Labs since last visit of relevance include the following: Results for orders placed during the hospital encounter of 12/25/13  CBC WITH DIFFERENTIAL      Result Value Ref Range   WBC 6.0  4.0 - 10.5 K/uL   RBC 4.10  3.87 - 5.11 MIL/uL   Hemoglobin 12.8  12.0 - 15.0 g/dL   HCT 38.0  36.0 - 46.0 %   MCV 92.7  78.0 - 100.0 fL   MCH 31.2  26.0 - 34.0 pg   MCHC 33.7  30.0 - 36.0 g/dL   RDW 13.0  11.5 - 15.5 %   Platelets 235  150 - 400 K/uL   Neutrophils Relative % 60  43 - 77 %   Neutro Abs 3.6  1.7 - 7.7 K/uL   Lymphocytes Relative 21  12 - 46 %   Lymphs Abs 1.3  0.7 - 4.0 K/uL   Monocytes Relative 12  3 - 12 %   Monocytes Absolute 0.7  0.1 - 1.0 K/uL   Eosinophils Relative 6 (*) 0 - 5 %   Eosinophils Absolute 0.4  0.0 - 0.7 K/uL   Basophils Relative 1  0 - 1 %   Basophils Absolute 0.1  0.0 - 0.1 K/uL  COMPREHENSIVE METABOLIC PANEL      Result Value  Ref Range   Sodium 136 (*) 137 - 147 mEq/L   Potassium 4.3  3.7 - 5.3 mEq/L   Chloride 97  96 - 112 mEq/L   CO2 28  19 - 32 mEq/L   Glucose, Bld 90  70 - 99 mg/dL   BUN 25 (*) 6 - 23 mg/dL   Creatinine, Ser 0.94  0.50 - 1.10 mg/dL   Calcium 9.6  8.4 - 10.5 mg/dL   Total Protein 6.5  6.0 - 8.3 g/dL   Albumin 3.5  3.5 - 5.2 g/dL   AST 15  0 - 37 U/L   ALT 14  0 - 35 U/L   Alkaline Phosphatase 52  39 - 117 U/L   Total Bilirubin 0.3  0.3 - 1.2 mg/dL   GFR calc non Af Amer 53 (*) >90 mL/min   GFR calc Af Amer 61 (*) >90 mL/min  BASIC METABOLIC PANEL      Result Value Ref Range   Sodium 133 (*) 137 - 147 mEq/L   Potassium 4.5  3.7 - 5.3 mEq/L   Chloride 96  96 - 112 mEq/L   CO2 26  19 - 32 mEq/L   Glucose, Bld 94  70 - 99 mg/dL   BUN 25 (*) 6 - 23 mg/dL   Creatinine, Ser 0.97  0.50 - 1.10 mg/dL   Calcium 9.3  8.4 - 10.5 mg/dL   GFR calc non Af Amer 51 (*) >90 mL/min   GFR calc Af Amer 59 (*) >90 mL/min    The neurologically relevant items on the patient's problem list were reviewed on today's visit.  Neurologic Examination  A problem focused neurological exam (12 or more points of the single system neurologic examination, vital signs counts as 1 point, cranial nerves count for 8 points) was performed.  Blood pressure 167/71, pulse 88, height 5' 2.5" (1.588 m), weight 142 lb (64.411 kg).  General - Well nourished, well developed, in no apparent distress.  Ophthalmologic - Sharp disc margins OU.  Cardiovascular - Regular rate and rhythm with no murmur.  Mental Status -  Level of arousal and orientation  to time, place, and person were intact. Language including expression, naming, repetition, comprehension was assessed and found intact. Recent and remote memory were tested with 3/3 registration and 2/3 delayed recall. Fund of Knowledge was assessed and was intact.  Cranial Nerves II - XII - II - Vision intact OU. III, IV, VI - Extraocular movements intact. V - Facial  sensation intact bilaterally. VII - Facial movement intact bilaterally. VIII - Hearing & vestibular intact bilaterally. X - Palate elevates symmetrically. XI - Chin turning & shoulder shrug intact bilaterally. XII - Tongue protrusion intact.  Motor Strength - The patient's strength was normal in all extremities and pronator drift was absent.  Bulk was normal and fasciculations were absent.   Motor Tone - Muscle tone was assessed at the neck and appendages and was normal.  Reflexes - The patient's reflexes were decreased in all extremities and she had no pathological reflexes.  Sensory - Light touch, temperature/pinprick were assessed and were normal.    Coordination - The patient had normal movements in the hands and feet with no ataxia or dysmetria.  Tremor was absent.  Gait and Station - walk with walker steady, without difficulty.  Data reviewed: CT of the brain  12/24/2013 1. 1.2 x 0.7 cm focal intraparenchymal hemorrhage at the left posterior basal ganglia is relatively stable in size from recent prior MRI, though mildly increased in size from prior CT. Mild associated vasogenic edema again seen. No evidence of midline shift. The location remains most compatible with a hypertensive bleed. 2. No evidence of acute infarct. 3. Scattered small vessel ischemic microangiopathy.  12/22/2013 ([facility]) 2x1x1 L CR hemorhage  MRI of the brain 12/23/2013 1. Solitary small subacute hemorrhage in the left hemisphere white matter is slightly increased in size since 12/22/2013 (estimated volume 1-2 mL). Mild edema and no significant mass effect.  MRA of the brain 12/23/2013 2. Negative for age intracranial MRA.  2D Echocardiogram EF 65-70% with no source of embolus. Image quality limits evaluation of the mitral valve. Cannot exclude systolic anterior motion of the anterior leaflet as the underlying mechansm for MR. LDL = 84 and A1C = 5.5  Assessment: As you may recall, she is a 80 y.o. Caucasian female  with a diagnosis of left corona radiata small ICH. She has PMH of HTN and hypothyroidism. She was sent to inpt rehab after discharge and doing well with home PT/OT. Current exam showed intact neuro exam. Her BP was controlled at home but not in clinic may due to nervousness. She will continue her stroke risk factor modification with her PCP. She is able to resume ASA 81 mg daily. No need to repeat CT head due to negative neuro exam and no concerning with recurrent bleeding. Not on statin due to intolerance and her LDL was 84 last check. No neck imaging yet, will need to do CUS.   Diagnoses from this visit: Nontraumatic hemorrhage of cerebral hemisphere  Plan:  - continue current meds for BP control - continue to check BP at home and follow up with PCP for stroke risk modification. - resume ASA 81 mg for stroke prevention  - carotid doppler at outpt - RTC in 3 months.  Orders Placed This Encounter  Procedures  . US Carotid Bilateral    Standing Status: Future     Number of Occurrences:      Standing Expiration Date: 05/10/2015    Order Specific Question:  Reason for Exam (SYMPTOM  OR DIAGNOSIS REQUIRED)    Answer:  stroke    Order Specific Question:  Preferred imaging location?    Answer:  Internal    Order Specific Question:  Call Report- Best Contact Number?    Answer:  [phone]    Patient Instructions  1. BP pressure seems controlled at home. But 167/71, but you said you were nervous at BP checking 2. You can resume 81mg  ASA now.  3. Will check carotid doppler and you can either do it at our clinic or you can check with Dr. Woody Seller or Dr. Domenic Polite to arrange it locally and fax over to [place]. 4. Follow up in the clinic in 3 months.

## 2014-03-08 NOTE — Patient Instructions (Signed)
1. BP pressure seems controlled at home. But 167/71, but you said you were nervous at BP checking 2. You can resume 81mg  ASA now.  3. Will check carotid doppler and you can either do it at our clinic or you can check with Dr. Woody Seller or Dr. Domenic Polite to arrange it locally and fax over to [place]. 4. Follow up in the clinic in 3 months.

## 2014-03-11 ENCOUNTER — Ambulatory Visit (INDEPENDENT_AMBULATORY_CARE_PROVIDER_SITE_OTHER): Payer: Medicare Other | Admitting: Cardiology

## 2014-03-11 ENCOUNTER — Encounter: Payer: Self-pay | Admitting: Cardiology

## 2014-03-11 VITALS — BP 150/76 | HR 77 | Ht 64.0 in | Wt 146.8 lb

## 2014-03-11 DIAGNOSIS — Z8673 Personal history of transient ischemic attack (TIA), and cerebral infarction without residual deficits: Secondary | ICD-10-CM

## 2014-03-11 DIAGNOSIS — I619 Nontraumatic intracerebral hemorrhage, unspecified: Secondary | ICD-10-CM

## 2014-03-11 DIAGNOSIS — I2510 Atherosclerotic heart disease of native coronary artery without angina pectoris: Secondary | ICD-10-CM

## 2014-03-11 DIAGNOSIS — I10 Essential (primary) hypertension: Secondary | ICD-10-CM

## 2014-03-11 MED ORDER — ATENOLOL 25 MG PO TABS: 25.0000 mg | ORAL_TABLET | Freq: Two times a day (BID) | ORAL | Status: DC

## 2014-03-11 NOTE — Assessment & Plan Note (Signed)
No active angina symptoms. History of nonobstructive disease. She is back on aspirin. Takes coenzyme Q10 with history of statin intolerance. Fortunately LDL recent only 84.

## 2014-03-11 NOTE — Progress Notes (Signed)
Clinical Summary Lisa Kemp is an 80 y.o.female last seen in November 2014.  Interval records reviewed including presentation back in April with right-sided weakness and findings of left internal capsule hemorrhage in the setting of accelerated hypertension, also on aspirin at that time. She was managed at [facility] and also underwent inpatient rehabilitation. Recent visit with Neurology noted. It was indicated that she could resume aspirin. Carotid Dopplers were also ordered - pending. She states she was like to have these done here in [place].  She is here with her sister. Has been undergoing home PT/OT, doing fairly well, uses a cane. She is not yet driving. She reports no angina symptoms, no palpitations.  Cardiolite in September 2012 demonstrated no evidence of ischemia with preserved LVEF.  Blood pressure at home today before she came in with "130/64."   Allergies  Allergen Reactions  . Diazepam   . Diazepam Other (See Comments)    Could not walk  . Statins     Current Outpatient Prescriptions  Medication Sig Dispense Refill  . aspirin 81 MG tablet Take 81 mg by mouth daily.      [name] atenolol (TENORMIN) 25 MG tablet Take 1 tablet (25 mg total) by mouth 2 (two) times daily.  180 tablet  3  . calcium-vitamin D (OSCAL WITH D) 500-200 MG-UNIT per tablet Take 1 tablet by mouth 2 (two) times daily.        . Coenzyme Q10 (COQ-10) 200 MG CAPS Take 1 capsule by mouth daily.        . diclofenac sodium (VOLTAREN) 1 % GEL Apply 2 g topically as needed.      . Ferrous Sulfate (IRON) 325 (65 FE) MG TABS Take 1 tablet by mouth daily.  30 each  0  . furosemide (LASIX) 20 MG tablet Take 1 tablet (20 mg total) by mouth daily.  30 tablet  0  . gabapentin (NEURONTIN) 100 MG capsule Take 1 capsule (100 mg total) by mouth at bedtime.  30 capsule  1  . levothyroxine (SYNTHROID, LEVOTHROID) 112 MCG tablet Take 1 tablet (112 mcg total) by mouth daily.  30 tablet  1  . LORazepam (ATIVAN) 2 MG tablet  Take 1 tablet (2 mg total) by mouth at bedtime.  30 tablet  0  . Magnesium 250 MG TABS Take 1 tablet by mouth daily.        . Multiple Vitamin (MULTIVITAMIN) capsule Take 1 capsule by mouth daily.        . ranitidine (ZANTAC) 150 MG tablet Take 150-300 mg by mouth. 2 tablets in AM and 1 tablet in PM      . nitroGLYCERIN (NITROSTAT) 0.4 MG SL tablet Place 1 tablet (0.4 mg total) under the tongue as directed.  25 tablet  3   No current facility-administered medications for this visit.    Past Medical History  Diagnosis Date  . Mixed hyperlipidemia     Statin intolerant  . Gastrointestinal bleed     Related to NSAIDs  . Coronary atherosclerosis of native coronary artery     Nonobstructive at catherization 2006  . Hiatal hernia     Moderate sized sliding hiatal hernia  . Esophageal ring     Distal esophageal ring status post dilation  . Colon polyps   . GERD (gastroesophageal reflux disease)   . Arthritis   . Hypothyroidism   . Hypertension     Social History Ms. Allport reports that she has never smoked. She does  not have any smokeless tobacco history on file. Ms. Hensarling reports that she does not drink alcohol.  Review of Systems No palpitations, dizziness, syncope. Stable appetite. Back on aspirin, no headaches or visual changes. Other systems reviewed and negative.  Physical Examination Filed Vitals:   03/11/14 1509  BP: 150/76  Pulse: 77   Filed Weights   03/11/14 1509  Weight: 146 lb 12.8 oz (66.588 kg)    Appears comfortable at rest. HEENT: Conjunctiva and lids normal, oropharynx with moist mucosa.  Neck: Supple, no bruits, no thyromegaly.  Lungs: Clear to auscultation, diminished, nonlabored.  Cardiac: Regular rate and rhythm, soft systolic murmur at the base, no S3 gallop.  Abdomen: Soft, nontender, bowel sounds present.  Skin: Warm and dry.  Extremities: No pitting edema distal pulses 1-2+.    Problem List and Plan   CAD, NATIVE VESSEL No active  angina symptoms. History of nonobstructive disease. She is back on aspirin. Takes coenzyme Q10 with history of statin intolerance. Fortunately LDL recent only 84.  Essential hypertension, benign No change to current regimen. Keep followup with Dr. Woody Seller.  Intracerebral hemorrhage History of stroke with intracranial hemorrhage in the setting of accelerated hypertension back in April. She continues to have followup with Neurology, carotid Dopplers were recommended and these will be obtained here in [place].    Satira Sark, M.D., F.A.C.C.

## 2014-03-11 NOTE — Patient Instructions (Signed)
Your physician recommends that you schedule a follow-up appointment in: 3 months. You will receive a reminder letter in the mail in about 1-2 months reminding you to call and schedule your appointment. If you don't receive this letter, please contact our office. Your physician recommends that you continue on your current medications as directed. Please refer to the Current Medication list given to you today. Your physician has requested that you have a carotid duplex. This test is an ultrasound of the carotid arteries in your neck. It looks at blood flow through these arteries that supply the brain with blood. Allow one hour for this exam. There are no restrictions or special instructions.

## 2014-03-11 NOTE — Assessment & Plan Note (Signed)
No change to current regimen. Keep followup with Dr. Woody Seller.

## 2014-03-11 NOTE — Assessment & Plan Note (Signed)
History of stroke with intracranial hemorrhage in the setting of accelerated hypertension back in April. She continues to have followup with Neurology, carotid Dopplers were recommended and these will be obtained here in [place].

## 2014-03-12 ENCOUNTER — Ambulatory Visit: Payer: Medicare Other | Admitting: Cardiology

## 2014-03-12 DIAGNOSIS — I619 Nontraumatic intracerebral hemorrhage, unspecified: Secondary | ICD-10-CM

## 2014-03-12 DIAGNOSIS — I10 Essential (primary) hypertension: Secondary | ICD-10-CM

## 2014-03-12 DIAGNOSIS — I6200 Nontraumatic subdural hemorrhage, unspecified: Secondary | ICD-10-CM

## 2014-03-12 DIAGNOSIS — E785 Hyperlipidemia, unspecified: Secondary | ICD-10-CM

## 2014-03-12 DIAGNOSIS — I2510 Atherosclerotic heart disease of native coronary artery without angina pectoris: Secondary | ICD-10-CM

## 2014-03-30 ENCOUNTER — Ambulatory Visit: Payer: Self-pay | Admitting: Neurology

## 2014-04-22 ENCOUNTER — Encounter (INDEPENDENT_AMBULATORY_CARE_PROVIDER_SITE_OTHER): Payer: Medicare Other

## 2014-04-22 DIAGNOSIS — G459 Transient cerebral ischemic attack, unspecified: Secondary | ICD-10-CM

## 2014-04-22 DIAGNOSIS — Z8673 Personal history of transient ischemic attack (TIA), and cerebral infarction without residual deficits: Secondary | ICD-10-CM

## 2014-04-26 ENCOUNTER — Telehealth: Payer: Self-pay | Admitting: *Deleted

## 2014-04-26 NOTE — Telephone Encounter (Signed)
Message copied by Merlene Laughter on Mon Apr 26, 2014  2:38 PM ------      Message from: Satira Sark      Created: Fri Apr 23, 2014 11:23 AM       Reviewed report. 1-39% bilateral ICA stenosis. Overall mild. Continue medical therapy. ------

## 2014-04-26 NOTE — Telephone Encounter (Signed)
Patient informed. 

## 2014-06-11 ENCOUNTER — Ambulatory Visit (INDEPENDENT_AMBULATORY_CARE_PROVIDER_SITE_OTHER): Payer: Medicare Other | Admitting: Cardiology

## 2014-06-11 ENCOUNTER — Encounter: Payer: Self-pay | Admitting: Cardiology

## 2014-06-11 VITALS — BP 153/75 | HR 83 | Ht 64.0 in | Wt 148.2 lb

## 2014-06-11 DIAGNOSIS — I6523 Occlusion and stenosis of bilateral carotid arteries: Secondary | ICD-10-CM

## 2014-06-11 DIAGNOSIS — E782 Mixed hyperlipidemia: Secondary | ICD-10-CM

## 2014-06-11 DIAGNOSIS — I6529 Occlusion and stenosis of unspecified carotid artery: Secondary | ICD-10-CM | POA: Insufficient documentation

## 2014-06-11 DIAGNOSIS — I10 Essential (primary) hypertension: Secondary | ICD-10-CM

## 2014-06-11 DIAGNOSIS — I2510 Atherosclerotic heart disease of native coronary artery without angina pectoris: Secondary | ICD-10-CM

## 2014-06-11 MED ORDER — NITROGLYCERIN 0.4 MG SL SUBL: 0.4000 mg | SUBLINGUAL_TABLET | SUBLINGUAL | Status: DC

## 2014-06-11 NOTE — Assessment & Plan Note (Signed)
History of significant statin intolerances over the years. She takes co-Q10.

## 2014-06-11 NOTE — Assessment & Plan Note (Signed)
Only one to 39% stenosis by carotid Dopplers in August.

## 2014-06-11 NOTE — Patient Instructions (Signed)
Continue all current medications. Your physician wants you to follow up in: 6 months.  You will receive a reminder letter in the mail one-two months in advance.  If you don't receive a letter, please call our office to schedule the follow up appointment   

## 2014-06-11 NOTE — Assessment & Plan Note (Signed)
No active angina symptoms with history of nonobstructive disease. Continue aspirin and beta blocker.

## 2014-06-11 NOTE — Assessment & Plan Note (Signed)
States that she checks her blood pressure at home and typically systolics are at or below 140. I recommended that she keep followup with Dr. Woody Seller in case she needs additional medical therapy. Currently on beta blocker and diuretic.

## 2014-06-11 NOTE — Progress Notes (Signed)
Clinical Summary Lisa Kemp is an 80 y.o.female last seen in July. She reports no chest pains, stable dyspnea on exertion. Using a walker to ambulate, no falls. She is eager to go back to work with hearing aids, she has done this for many years, not strenuous.  Interval carotid Dopplers demonstrated only 1-39% bilateral ICA stenoses. I discussed this with her today. She has been back on low-dose aspirin.   Allergies  Allergen Reactions  . Diazepam   . Diazepam Other (See Comments)    Could not walk  . Statins     Current Outpatient Prescriptions  Medication Sig Dispense Refill  . aspirin 81 MG tablet Take 81 mg by mouth daily.      [name] atenolol (TENORMIN) 25 MG tablet Take 1 tablet (25 mg total) by mouth 2 (two) times daily.  180 tablet  3  . calcium-vitamin D (OSCAL WITH D) 500-200 MG-UNIT per tablet Take 1 tablet by mouth 2 (two) times daily.        . Coenzyme Q10 (COQ-10) 200 MG CAPS Take 1 capsule by mouth daily.        . diclofenac sodium (VOLTAREN) 1 % GEL Apply 2 g topically as needed.      . Ferrous Sulfate (IRON) 325 (65 FE) MG TABS Take 1 tablet by mouth daily.  30 each  0  . furosemide (LASIX) 20 MG tablet Take 1 tablet (20 mg total) by mouth daily.  30 tablet  0  . gabapentin (NEURONTIN) 100 MG capsule Take 1 capsule (100 mg total) by mouth at bedtime.  30 capsule  1  . levothyroxine (SYNTHROID, LEVOTHROID) 112 MCG tablet Take 1 tablet (112 mcg total) by mouth daily.  30 tablet  1  . LORazepam (ATIVAN) 2 MG tablet Take 1 tablet (2 mg total) by mouth at bedtime.  30 tablet  0  . Magnesium 250 MG TABS Take 1 tablet by mouth daily.        . Multiple Vitamin (MULTIVITAMIN) capsule Take 1 capsule by mouth daily.        . nitroGLYCERIN (NITROSTAT) 0.4 MG SL tablet Place 1 tablet (0.4 mg total) under the tongue as directed.  25 tablet  3  . ranitidine (ZANTAC) 150 MG tablet Take 150-300 mg by mouth. 2 tablets in AM and 1 tablet in PM       No current facility-administered  medications for this visit.    Past Medical History  Diagnosis Date  . Mixed hyperlipidemia     Statin intolerant  . Gastrointestinal bleed     Related to NSAIDs  . Coronary atherosclerosis of native coronary artery     Nonobstructive at catherization 2006  . Hiatal hernia     Moderate sized sliding hiatal hernia  . Esophageal ring     Distal esophageal ring status post dilation  . Colon polyps   . GERD (gastroesophageal reflux disease)   . Arthritis   . Hypothyroidism   . Hypertension     Social History Lisa Kemp reports that she has never smoked. She has never used smokeless tobacco. Lisa Kemp reports that she does not drink alcohol.  Review of Systems Stable appetite. Peripheral neuropathy. Other systems reviewed and negative except as outlined.  Physical Examination Filed Vitals:   06/11/14 1259  BP: 153/75  Pulse: 83   Filed Weights   06/11/14 1259  Weight: 148 lb 4 oz (67.246 kg)    Appears comfortable at rest.  HEENT: Conjunctiva and  lids normal, oropharynx with moist mucosa.  Neck: Supple, no bruits, no thyromegaly.  Lungs: Clear to auscultation, diminished, nonlabored.  Cardiac: Regular rate and rhythm, soft systolic murmur at the base, no S3 gallop.  Abdomen: Soft, nontender, bowel sounds present.  Skin: Warm and dry.  Extremities: No pitting edema distal pulses 1-2+.    Problem List and Plan   CAD, NATIVE VESSEL No active angina symptoms with history of nonobstructive disease. Continue aspirin and beta blocker.  Essential hypertension, benign States that she checks her blood pressure at home and typically systolics are at or below 140. I recommended that she keep followup with Dr. Woody Seller in case she needs additional medical therapy. Currently on beta blocker and diuretic.  Mixed hyperlipidemia History of significant statin intolerances over the years. She takes co-Q10.  Carotid atherosclerosis Only one to 39% stenosis by carotid Dopplers  in August.    Satira Sark, M.D., F.A.C.C.

## 2014-06-14 ENCOUNTER — Ambulatory Visit: Payer: Medicare Other | Admitting: Neurology

## 2014-06-15 ENCOUNTER — Other Ambulatory Visit: Payer: Self-pay | Admitting: *Deleted

## 2014-11-29 ENCOUNTER — Ambulatory Visit: Payer: Self-pay | Admitting: Cardiology

## 2014-12-14 ENCOUNTER — Ambulatory Visit (INDEPENDENT_AMBULATORY_CARE_PROVIDER_SITE_OTHER): Payer: Medicare Other | Admitting: Cardiology

## 2014-12-14 ENCOUNTER — Encounter: Payer: Self-pay | Admitting: Cardiology

## 2014-12-14 VITALS — BP 158/70 | HR 75 | Ht 64.0 in | Wt 144.8 lb

## 2014-12-14 DIAGNOSIS — I2510 Atherosclerotic heart disease of native coronary artery without angina pectoris: Secondary | ICD-10-CM

## 2014-12-14 DIAGNOSIS — E782 Mixed hyperlipidemia: Secondary | ICD-10-CM

## 2014-12-14 DIAGNOSIS — I10 Essential (primary) hypertension: Secondary | ICD-10-CM

## 2014-12-14 NOTE — Patient Instructions (Signed)

## 2014-12-14 NOTE — Progress Notes (Signed)
Cardiology Office Note  Date: 12/14/2014   ID: Lisa Kemp, DOB 1923-12-03, MRN [id number]  PCP: Glenda Chroman., MD  Primary Cardiologist: Rozann Lesches, MD   Chief Complaint  Patient presents with  . Coronary Artery Disease  . Hypertension    History of Present Illness: Lisa Kemp is an 81 y.o. female last seen in October 2015. She presents for a routine visit today. She has had no angina symptoms. Still has some mild residual weakness in her right arm and hand after previous stroke, no new symptoms. She uses a walker to ambulate and denies any falls. She states that she will be having a physical with Dr. Woody Seller tomorrow and anticipates having lab work at that time.  ECG done today shows sinus rhythm with nonspecific ST changes.  I reviewed her medications. She has a history of statin intolerance and takes coenzyme Q10. She has a previously documented history of nonobstructive CAD with subsequent negative ischemic testing. We have continued her on aspirin and beta blocker   Past Medical History  Diagnosis Date  . Mixed hyperlipidemia     Statin intolerant  . Gastrointestinal bleed     Related to NSAIDs  . Coronary atherosclerosis of native coronary artery     Nonobstructive at catherization 2006  . Hiatal hernia     Moderate sized sliding hiatal hernia  . Esophageal ring     Distal esophageal ring status post dilation  . Colon polyps   . GERD (gastroesophageal reflux disease)   . Arthritis   . Hypothyroidism   . Hypertension      Current Outpatient Prescriptions  Medication Sig Dispense Refill  . aspirin 81 MG tablet Take 81 mg by mouth daily.    [name] atenolol (TENORMIN) 25 MG tablet Take 1 tablet (25 mg total) by mouth 2 (two) times daily. 180 tablet 3  . calcium-vitamin D (OSCAL WITH D) 500-200 MG-UNIT per tablet Take 1 tablet by mouth 2 (two) times daily.      . Coenzyme Q10 (COQ-10) 200 MG CAPS Take 1 capsule by mouth daily.      . Ferrous Sulfate  (IRON) 325 (65 FE) MG TABS Take 1 tablet by mouth daily. 30 each 0  . furosemide (LASIX) 20 MG tablet Take 1 tablet (20 mg total) by mouth daily. 30 tablet 0  . gabapentin (NEURONTIN) 100 MG capsule Take 1 capsule (100 mg total) by mouth at bedtime. 30 capsule 1  . levothyroxine (SYNTHROID, LEVOTHROID) 112 MCG tablet Take 1 tablet (112 mcg total) by mouth daily. 30 tablet 1  . LORazepam (ATIVAN) 2 MG tablet Take 1 tablet (2 mg total) by mouth at bedtime. 30 tablet 0  . Magnesium 250 MG TABS Take 1 tablet by mouth daily.      . Multiple Vitamin (MULTIVITAMIN) capsule Take 1 capsule by mouth daily.      . nitroGLYCERIN (NITROSTAT) 0.4 MG SL tablet Place 1 tablet (0.4 mg total) under the tongue as directed. 25 tablet 3  . potassium chloride (K-DUR) 10 MEQ tablet Take 1 tablet (10 mEq total) by mouth 3 (three) times daily.    . ranitidine (ZANTAC) 150 MG tablet Take 150-300 mg by mouth. 2 tablets in AM and 1 tablet in PM     No current facility-administered medications for this visit.    Allergies:  Diazepam; Diazepam; and Statins   Social History: The patient  reports that she has never smoked. She has never used smokeless tobacco. She reports  that she does not drink alcohol or use illicit drugs.   ROS:  Please see the history of present illness. Otherwise, complete review of systems is positive for arthritic pains.  All other systems are reviewed and negative.   Physical Exam: VS:  BP 158/70 mmHg  Pulse 75  Ht 5\' 4"  (1.626 m)  Wt 144 lb 12.8 oz (65.681 kg)  BMI 24.84 kg/m2  SpO2 98%, BMI Body mass index is 24.84 kg/(m^2).  Wt Readings from Last 3 Encounters:  12/14/14 144 lb 12.8 oz (65.681 kg)  06/11/14 148 lb 4 oz (67.246 kg)  03/11/14 146 lb 12.8 oz (66.588 kg)     Appears comfortable at rest.  HEENT: Conjunctiva and lids normal, oropharynx with moist mucosa.  Neck: Supple, no bruits, no thyromegaly.  Lungs: Clear to auscultation, diminished, nonlabored.  Cardiac: Regular  rate and rhythm, soft systolic murmur at the base, no S3 gallop.  Abdomen: Soft, nontender, bowel sounds present.  Skin: Warm and dry.  Extremities: No pitting edema distal pulses 1-2+.    ECG: ECG is ordered today and shows sinus rhythm with nonspecific ST changes.   Recent Labwork: 12/28/2013: ALT 14; AST 15; Hemoglobin 12.8; Platelets 235 12/31/2013: BUN 25*; Creatinine 0.97; Potassium 4.5; Sodium 133*     Component Value Date/Time   CHOL 157 12/23/2013 0228   TRIG 188* 12/23/2013 0228   HDL 35* 12/23/2013 0228   CHOLHDL 4.5 12/23/2013 0228   VLDL 38 12/23/2013 0228   LDLCALC 84 12/23/2013 0228    Assessment and Plan:  1. History of nonobstructive CAD, no active angina symptoms on medical therapy. No changes were made today.  2. Hyperlipidemia with statin intolerance, LDL 84 last year on coenzyme Q10. She follows up with Dr. Woody Seller tomorrow.  3.Essential hypertension, blood pressure mildly elevated today. Keep follow-up with Dr. Woody Seller.  Current medicines were reviewed with the patient today.   Orders Placed This Encounter  Procedures  . EKG 12-Lead    Disposition: FU with me in 6 months.   Signed, Satira Sark, MD, [facility] 12/14/2014 2:51 PM    [facility] [place], [place], [place] [address] Phone: [phone]; Fax: [phone]

## 2014-12-23 ENCOUNTER — Ambulatory Visit: Payer: Self-pay | Admitting: Cardiology

## 2015-02-08 ENCOUNTER — Other Ambulatory Visit: Payer: Self-pay | Admitting: *Deleted

## 2015-02-08 MED ORDER — ATENOLOL 25 MG PO TABS: 25.0000 mg | ORAL_TABLET | Freq: Two times a day (BID) | ORAL | Status: DC

## 2015-03-15 ENCOUNTER — Encounter (INDEPENDENT_AMBULATORY_CARE_PROVIDER_SITE_OTHER): Payer: Self-pay | Admitting: *Deleted

## 2015-03-17 ENCOUNTER — Encounter (INDEPENDENT_AMBULATORY_CARE_PROVIDER_SITE_OTHER): Payer: Self-pay | Admitting: *Deleted

## 2015-03-22 ENCOUNTER — Emergency Department (HOSPITAL_COMMUNITY): Payer: Medicare Other

## 2015-03-22 ENCOUNTER — Emergency Department (HOSPITAL_COMMUNITY)
Admission: EM | Admit: 2015-03-22 | Discharge: 2015-03-22 | Disposition: A | Discharge status: Home / Self Care | Payer: Medicare Other | Attending: Emergency Medicine | Admitting: Emergency Medicine

## 2015-03-22 ENCOUNTER — Encounter (HOSPITAL_COMMUNITY): Payer: Self-pay | Admitting: Emergency Medicine

## 2015-03-22 DIAGNOSIS — Y939 Activity, unspecified: Secondary | ICD-10-CM | POA: Diagnosis not present

## 2015-03-22 DIAGNOSIS — S40021A Contusion of right upper arm, initial encounter: Secondary | ICD-10-CM

## 2015-03-22 DIAGNOSIS — Z79899 Other long term (current) drug therapy: Secondary | ICD-10-CM | POA: Diagnosis not present

## 2015-03-22 DIAGNOSIS — Y999 Unspecified external cause status: Secondary | ICD-10-CM | POA: Diagnosis not present

## 2015-03-22 DIAGNOSIS — E782 Mixed hyperlipidemia: Secondary | ICD-10-CM | POA: Diagnosis not present

## 2015-03-22 DIAGNOSIS — E039 Hypothyroidism, unspecified: Secondary | ICD-10-CM | POA: Diagnosis not present

## 2015-03-22 DIAGNOSIS — W1839XA Other fall on same level, initial encounter: Secondary | ICD-10-CM | POA: Insufficient documentation

## 2015-03-22 DIAGNOSIS — I10 Essential (primary) hypertension: Secondary | ICD-10-CM | POA: Insufficient documentation

## 2015-03-22 DIAGNOSIS — K219 Gastro-esophageal reflux disease without esophagitis: Secondary | ICD-10-CM | POA: Insufficient documentation

## 2015-03-22 DIAGNOSIS — S0101XA Laceration without foreign body of scalp, initial encounter: Secondary | ICD-10-CM | POA: Diagnosis not present

## 2015-03-22 DIAGNOSIS — M1990 Unspecified osteoarthritis, unspecified site: Secondary | ICD-10-CM | POA: Insufficient documentation

## 2015-03-22 DIAGNOSIS — Z7982 Long term (current) use of aspirin: Secondary | ICD-10-CM | POA: Diagnosis not present

## 2015-03-22 DIAGNOSIS — I2510 Atherosclerotic heart disease of native coronary artery without angina pectoris: Secondary | ICD-10-CM | POA: Insufficient documentation

## 2015-03-22 DIAGNOSIS — Z86010 Personal history of colonic polyps: Secondary | ICD-10-CM | POA: Diagnosis not present

## 2015-03-22 DIAGNOSIS — Y9289 Other specified places as the place of occurrence of the external cause: Secondary | ICD-10-CM | POA: Diagnosis not present

## 2015-03-22 DIAGNOSIS — W19XXXA Unspecified fall, initial encounter: Secondary | ICD-10-CM

## 2015-03-22 DIAGNOSIS — S0990XA Unspecified injury of head, initial encounter: Secondary | ICD-10-CM | POA: Diagnosis present

## 2015-03-22 LAB — BASIC METABOLIC PANEL
Anion gap: 10 (ref 5–15)
BUN: 25 mg/dL — AB (ref 6–20)
CALCIUM: 9 mg/dL (ref 8.9–10.3)
CO2: 24 mmol/L (ref 22–32)
CREATININE: 1.16 mg/dL — AB (ref 0.44–1.00)
Chloride: 95 mmol/L — ABNORMAL LOW (ref 101–111)
GFR, EST AFRICAN AMERICAN: 47 mL/min — AB (ref >60)
GFR, EST NON AFRICAN AMERICAN: 40 mL/min — AB (ref >60)
Glucose, Bld: 111 mg/dL — ABNORMAL HIGH (ref 65–99)
Potassium: 4.1 mmol/L (ref 3.5–5.1)
Sodium: 129 mmol/L — ABNORMAL LOW (ref 135–145)

## 2015-03-22 LAB — CBC WITH DIFFERENTIAL/PLATELET
Basophils Absolute: 0 10*3/uL (ref 0.0–0.1)
Basophils Relative: 1 % (ref 0–1)
Eosinophils Absolute: 0.1 10*3/uL (ref 0.0–0.7)
Eosinophils Relative: 2 % (ref 0–5)
HCT: 37.5 % (ref 36.0–46.0)
Hemoglobin: 12.6 g/dL (ref 12.0–15.0)
Lymphocytes Relative: 22 % (ref 12–46)
Lymphs Abs: 1.2 10*3/uL (ref 0.7–4.0)
MCH: 30.8 pg (ref 26.0–34.0)
MCHC: 33.6 g/dL (ref 30.0–36.0)
MCV: 91.7 fL (ref 78.0–100.0)
MONO ABS: 0.7 10*3/uL (ref 0.1–1.0)
Monocytes Relative: 13 % — ABNORMAL HIGH (ref 3–12)
Neutro Abs: 3.6 10*3/uL (ref 1.7–7.7)
Neutrophils Relative %: 62 % (ref 43–77)
Platelets: 224 10*3/uL (ref 150–400)
RBC: 4.09 MIL/uL (ref 3.87–5.11)
RDW: 13.5 % (ref 11.5–15.5)
WBC: 5.6 10*3/uL (ref 4.0–10.5)

## 2015-03-22 LAB — CBG MONITORING, ED: Glucose-Capillary: 131 mg/dL — ABNORMAL HIGH (ref 65–99)

## 2015-03-22 MED ORDER — LIDOCAINE-EPINEPHRINE (PF) 1 %-1:200000 IJ SOLN
10.0000 mL | Freq: Once | INTRAMUSCULAR | Status: AC
  Administered 2015-03-22: 10 mL via INTRADERMAL
  Filled 2015-03-22: qty 10

## 2015-03-22 NOTE — ED Provider Notes (Addendum)
CSN: [id number]     Arrival date & time 03/22/15  53 History   First MD Initiated Contact with Patient 03/22/15 1608     Chief Complaint  Patient presents with  . Fall     (Consider location/radiation/quality/duration/timing/severity/associated sxs/prior Treatment) HPI Comments: Patient is an 81 year old female with history of hypertension, hypercholesterolemia, prior subdural hematoma. She presents for evaluation of fall. Patient appears somewhat confused and does not recall the exact events that lead to her being brought here. I am told that she was found in the entry way to an office building on the floor bleeding from the back of her head. She does not recall whether or not she fell or passed out, but at present has no specific symptoms. She states that she feels fine.  Patient is a 81 y.o. female presenting with fall. The history is provided by the patient and the EMS personnel.  Fall This is a new problem. The current episode started less than 1 hour ago. The problem occurs constantly. The problem has not changed since onset.Pertinent negatives include no chest pain, no abdominal pain, no headaches and no shortness of breath. Nothing aggravates the symptoms. Nothing relieves the symptoms. She has tried nothing for the symptoms. The treatment provided no relief.    Past Medical History  Diagnosis Date  . Mixed hyperlipidemia     Statin intolerant  . Gastrointestinal bleed     Related to NSAIDs  . Coronary atherosclerosis of native coronary artery     Nonobstructive at catherization 2006  . Hiatal hernia     Moderate sized sliding hiatal hernia  . Esophageal ring     Distal esophageal ring status post dilation  . Colon polyps   . GERD (gastroesophageal reflux disease)   . Arthritis   . Hypothyroidism   . Hypertension    Past Surgical History  Procedure Laterality Date  . Appendectomy    . Cataract extraction    . Total abdominal hysterectomy w/ bilateral  salpingoophorectomy    . Total abdominal hysterectomy    . Colon surgery     Family History  Problem Relation Age of Onset  . Hypertension    . Heart attack Mother   . Heart attack Father    History  Substance Use Topics  . Smoking status: Never Smoker   . Smokeless tobacco: Never Used  . Alcohol Use: No     Comment: 06/11/14 -- "Never tasted a beer, wine, or a whiskey.." - AJ   OB History    No data available     Review of Systems  Respiratory: Negative for shortness of breath.   Cardiovascular: Negative for chest pain.  Gastrointestinal: Negative for abdominal pain.  Neurological: Negative for headaches.  All other systems reviewed and are negative.     Allergies  Diazepam; Diazepam; and Statins  Home Medications   Prior to Admission medications   Medication Sig Start Date End Date Taking? Authorizing Provider  aspirin 81 MG tablet Take 81 mg by mouth daily.    Historical Provider, MD  atenolol (TENORMIN) 25 MG tablet Take 1 tablet (25 mg total) by mouth 2 (two) times daily. 02/08/15   Satira Sark, MD  calcium-vitamin D (OSCAL WITH D) 500-200 MG-UNIT per tablet Take 1 tablet by mouth 2 (two) times daily.      Historical Provider, MD  Coenzyme Q10 (COQ-10) 200 MG CAPS Take 1 capsule by mouth daily.      Historical Provider, MD  Ferrous Sulfate (  IRON) 325 (65 FE) MG TABS Take 1 tablet by mouth daily. 01/01/14   Lavon Paganini Angiulli, PA-C  furosemide (LASIX) 20 MG tablet Take 1 tablet (20 mg total) by mouth daily. 02/04/14   Satira Sark, MD  gabapentin (NEURONTIN) 100 MG capsule Take 1 capsule (100 mg total) by mouth at bedtime. 01/01/14   Lavon Paganini Angiulli, PA-C  levothyroxine (SYNTHROID, LEVOTHROID) 112 MCG tablet Take 1 tablet (112 mcg total) by mouth daily. 01/01/14   Lavon Paganini Angiulli, PA-C  LORazepam (ATIVAN) 2 MG tablet Take 1 tablet (2 mg total) by mouth at bedtime. 01/01/14   Lavon Paganini Angiulli, PA-C  Magnesium 250 MG TABS Take 1 tablet by mouth daily.       Historical Provider, MD  Multiple Vitamin (MULTIVITAMIN) capsule Take 1 capsule by mouth daily.      Historical Provider, MD  nitroGLYCERIN (NITROSTAT) 0.4 MG SL tablet Place 1 tablet (0.4 mg total) under the tongue as directed. 06/11/14   Satira Sark, MD  potassium chloride (K-DUR) 10 MEQ tablet Take 1 tablet (10 mEq total) by mouth 3 (three) times daily. 06/15/14   Satira Sark, MD  ranitidine (ZANTAC) 150 MG tablet Take 150-300 mg by mouth. 2 tablets in AM and 1 tablet in PM 01/01/14   Daniel J Angiulli, PA-C   BP 188/82 mmHg  Pulse 97  Temp(Src) 98.6 F (37 C)  Resp 18  Ht 5\' 2"  (1.575 m)  Wt 140 lb (63.504 kg)  BMI 25.60 kg/m2  SpO2 95% Physical Exam  Constitutional: She is oriented to person, place, and time. She appears well-developed and well-nourished. No distress.  HENT:  Head: Normocephalic and atraumatic.  Eyes: EOM are normal. Pupils are equal, round, and reactive to light.  Neck: Normal range of motion. Neck supple.  There is no cervical spine tenderness and no step-offs. She has painless range of motion in all directions.  Cardiovascular: Normal rate and regular rhythm.  Exam reveals no gallop and no friction rub.   No murmur heard. Pulmonary/Chest: Effort normal and breath sounds normal. No respiratory distress. She has no wheezes.  Abdominal: Soft. Bowel sounds are normal. She exhibits no distension. There is no tenderness.  Musculoskeletal: Normal range of motion.  Neurological: She is alert and oriented to person, place, and time. No cranial nerve deficit. She exhibits normal muscle tone. Coordination normal.  Skin: Skin is warm and dry. She is not diaphoretic.  Nursing note and vitals reviewed.   ED Course  Procedures (including critical care time) Labs Review Labs Reviewed  CBG MONITORING, ED - Abnormal; Notable for the following:    Glucose-Capillary 131 (*)    All other components within normal limits  BASIC METABOLIC PANEL  CBC WITH  DIFFERENTIAL/PLATELET    Imaging Review No results found.   EKG Interpretation   Date/Time:  Tuesday March 22 2015 16:28:45 EDT Ventricular Rate:  91 PR Interval:  159 QRS Duration: 99 QT Interval:  378 QTC Calculation: 465 R Axis:   96 Text Interpretation:  Sinus rhythm Right axis deviation Borderline  repolarization abnormality Confirmed by Yen Wandell  MD, Keyion Knack ([id number]) on  03/22/2015 4:44:37 PM     LACERATION REPAIR Performed by: Veryl Speak Authorized by: Veryl Speak Consent: Verbal consent obtained. Risks and benefits: risks, benefits and alternatives were discussed Consent given by: patient Patient identity confirmed: provided demographic data Prepped and Draped in normal sterile fashion Wound explored  Laceration Location: Occiput  Laceration Length: 2.5 cm  No Foreign  Bodies seen or palpated  Anesthesia: local infiltration  Local anesthetic: lidocaine 1 % with epinephrine  Anesthetic total: 3 ml  Irrigation method: syringe Amount of cleaning: standard  Skin closure: Staples   Number of sutures: 2   Technique: Staples   Patient tolerance: Patient tolerated the procedure well with no immediate complications.   MDM   Final diagnoses:  None    Patient brought for evaluation of fall. She was found on the floor in the lobby of the pharmacy. She has a small laceration to the back of her scalp which was repaired with staples. She is neurologically intact and per family is at her mental status baseline. Head CT is negative and laboratory studies and EKG are unremarkable. When she is appropriate for discharge, to return as needed. Patient also complained of right upper arm discomfort. X-rays of her humerus are negative.    Veryl Speak, MD 03/22/15 [id number]  Veryl Speak, MD 03/22/15 [phone]

## 2015-03-22 NOTE — Discharge Instructions (Signed)
Staples are to be removed in 5-7 days. Please follow-up with your primary Dr. for this.  Return to the emergency department if you develop severe headache or any other new and concerning symptoms.   Head Injury You have received a head injury. It does not appear serious at this time. Headaches and vomiting are common following head injury. It should be easy to awaken from sleeping. Sometimes it is necessary for you to stay in the emergency department for a while for observation. Sometimes admission to the hospital may be needed. After injuries such as yours, most problems occur within the first 24 hours, but side effects may occur up to 7-10 days after the injury. It is important for you to carefully monitor your condition and contact your health care provider or seek immediate medical care if there is a change in your condition. WHAT ARE THE TYPES OF HEAD INJURIES? Head injuries can be as minor as a bump. Some head injuries can be more severe. More severe head injuries include:  A jarring injury to the brain (concussion).  A bruise of the brain (contusion). This mean there is bleeding in the brain that can cause swelling.  A cracked skull (skull fracture).  Bleeding in the brain that collects, clots, and forms a bump (hematoma). WHAT CAUSES A HEAD INJURY? A serious head injury is most likely to happen to someone who is in a car wreck and is not wearing a seat belt. Other causes of major head injuries include bicycle or motorcycle accidents, sports injuries, and falls. HOW ARE HEAD INJURIES DIAGNOSED? A complete history of the event leading to the injury and your current symptoms will be helpful in diagnosing head injuries. Many times, pictures of the brain, such as CT or MRI are needed to see the extent of the injury. Often, an overnight hospital stay is necessary for observation.  WHEN SHOULD I SEEK IMMEDIATE MEDICAL CARE?  You should get help right away if:  You have confusion or  drowsiness.  You feel sick to your stomach (nauseous) or have continued, forceful vomiting.  You have dizziness or unsteadiness that is getting worse.  You have severe, continued headaches not relieved by medicine. Only take over-the-counter or prescription medicines for pain, fever, or discomfort as directed by your health care provider.  You do not have normal function of the arms or legs or are unable to walk.  You notice changes in the black spots in the center of the colored part of your eye (pupil).  You have a clear or bloody fluid coming from your nose or ears.  You have a loss of vision. During the next 24 hours after the injury, you must stay with someone who can watch you for the warning signs. This person should contact local emergency services (911 in the [place] if you have seizures, you become unconscious, or you are unable to wake up. HOW CAN I PREVENT A HEAD INJURY IN THE FUTURE? The most important factor for preventing major head injuries is avoiding motor vehicle accidents. To minimize the potential for damage to your head, it is crucial to wear seat belts while riding in motor vehicles. Wearing helmets while bike riding and playing collision sports (like football) is also helpful. Also, avoiding dangerous activities around the house will further help reduce your risk of head injury.  WHEN CAN I RETURN TO NORMAL ACTIVITIES AND ATHLETICS? You should be reevaluated by your health care provider before returning to these activities. If you have any  of the following symptoms, you should not return to activities or contact sports until 1 week after the symptoms have stopped:  Persistent headache.  Dizziness or vertigo.  Poor attention and concentration.  Confusion.  Memory problems.  Nausea or vomiting.  Fatigue or tire easily.  Irritability.  Intolerant of bright lights or loud noises.  Anxiety or depression.  Disturbed sleep. MAKE SURE YOU:   Understand these  instructions.  Will watch your condition.  Will get help right away if you are not doing well or get worse. Document Released: 08/13/2005 Document Revised: 08/18/2013 Document Reviewed: 04/20/2013 [facility] Patient Information 2015 [place], [place]. This information is not intended to replace advice given to you by your health care provider. Make sure you discuss any questions you have with your health care provider.

## 2015-03-22 NOTE — ED Notes (Signed)
Attempted to call next of kin listed in chart, sister Ellamae Sia, no answer.

## 2015-03-22 NOTE — ED Notes (Addendum)
Pt found on floor in lobby of pharmacy. Per EMS-pt has alzheimer's. Pt does not remember falling unknown loc. Laceration to back of head noted.

## 2015-03-22 NOTE — ED Notes (Signed)
Pt now answering questions more appropriately. Oriented x 3. Pt still somewhat confused on situation and details of fall.   Pt sister at bedside. States patient is normally alert and oriented x 4 and has no history of alzheimers . 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 EKG obtained and pt sent to CT.

## 2015-03-31 ENCOUNTER — Telehealth (INDEPENDENT_AMBULATORY_CARE_PROVIDER_SITE_OTHER): Payer: Self-pay | Admitting: *Deleted

## 2015-03-31 NOTE — Telephone Encounter (Signed)
Spoke with sister, Lelon Frohlich, patient has fallen and not able to get around good at this time. They will call back to reschedule once she is better.

## 2015-03-31 NOTE — Telephone Encounter (Signed)
Voice Mail left by sister Heloise Beecham @ 1:29  She needs to cancel her appt on 8/10 and please recall and reschedule with Lelon Frohlich she is handling her business now.

## 2015-04-06 ENCOUNTER — Ambulatory Visit (INDEPENDENT_AMBULATORY_CARE_PROVIDER_SITE_OTHER): Payer: Self-pay | Admitting: Internal Medicine

## 2015-04-11 ENCOUNTER — Ambulatory Visit (INDEPENDENT_AMBULATORY_CARE_PROVIDER_SITE_OTHER): Payer: Self-pay | Admitting: Internal Medicine

## 2015-05-19 ENCOUNTER — Telehealth: Payer: Self-pay | Admitting: Cardiology

## 2015-05-19 NOTE — Telephone Encounter (Signed)
Spoke with Angie in reference to trying to get in touch with patient.  She explained that the patient is still in nursing home and will not be able to do follow up or test until she recovers.  Will contact us at that time

## 2015-07-29 DIAGNOSIS — E222 Syndrome of inappropriate secretion of antidiuretic hormone: Secondary | ICD-10-CM | POA: Diagnosis not present

## 2015-07-29 DIAGNOSIS — E119 Type 2 diabetes mellitus without complications: Secondary | ICD-10-CM | POA: Diagnosis not present

## 2015-07-29 DIAGNOSIS — E039 Hypothyroidism, unspecified: Secondary | ICD-10-CM | POA: Diagnosis not present

## 2015-07-29 DIAGNOSIS — I10 Essential (primary) hypertension: Secondary | ICD-10-CM | POA: Diagnosis not present

## 2015-07-29 DIAGNOSIS — Z8673 Personal history of transient ischemic attack (TIA), and cerebral infarction without residual deficits: Secondary | ICD-10-CM | POA: Diagnosis not present

## 2015-07-29 DIAGNOSIS — M810 Age-related osteoporosis without current pathological fracture: Secondary | ICD-10-CM | POA: Diagnosis not present

## 2015-07-29 DIAGNOSIS — I2510 Atherosclerotic heart disease of native coronary artery without angina pectoris: Secondary | ICD-10-CM | POA: Diagnosis not present

## 2015-07-29 DIAGNOSIS — M1990 Unspecified osteoarthritis, unspecified site: Secondary | ICD-10-CM | POA: Diagnosis not present

## 2015-07-29 DIAGNOSIS — K219 Gastro-esophageal reflux disease without esophagitis: Secondary | ICD-10-CM | POA: Diagnosis not present

## 2015-08-01 DIAGNOSIS — E119 Type 2 diabetes mellitus without complications: Secondary | ICD-10-CM | POA: Diagnosis not present

## 2015-08-01 DIAGNOSIS — M810 Age-related osteoporosis without current pathological fracture: Secondary | ICD-10-CM | POA: Diagnosis not present

## 2015-08-01 DIAGNOSIS — I2510 Atherosclerotic heart disease of native coronary artery without angina pectoris: Secondary | ICD-10-CM | POA: Diagnosis not present

## 2015-08-01 DIAGNOSIS — I10 Essential (primary) hypertension: Secondary | ICD-10-CM | POA: Diagnosis not present

## 2015-08-01 DIAGNOSIS — E222 Syndrome of inappropriate secretion of antidiuretic hormone: Secondary | ICD-10-CM | POA: Diagnosis not present

## 2015-08-01 DIAGNOSIS — M1990 Unspecified osteoarthritis, unspecified site: Secondary | ICD-10-CM | POA: Diagnosis not present

## 2015-08-02 DIAGNOSIS — M810 Age-related osteoporosis without current pathological fracture: Secondary | ICD-10-CM | POA: Diagnosis not present

## 2015-08-02 DIAGNOSIS — I10 Essential (primary) hypertension: Secondary | ICD-10-CM | POA: Diagnosis not present

## 2015-08-02 DIAGNOSIS — I2510 Atherosclerotic heart disease of native coronary artery without angina pectoris: Secondary | ICD-10-CM | POA: Diagnosis not present

## 2015-08-02 DIAGNOSIS — E222 Syndrome of inappropriate secretion of antidiuretic hormone: Secondary | ICD-10-CM | POA: Diagnosis not present

## 2015-08-02 DIAGNOSIS — E119 Type 2 diabetes mellitus without complications: Secondary | ICD-10-CM | POA: Diagnosis not present

## 2015-08-02 DIAGNOSIS — M1990 Unspecified osteoarthritis, unspecified site: Secondary | ICD-10-CM | POA: Diagnosis not present

## 2015-08-03 DIAGNOSIS — E119 Type 2 diabetes mellitus without complications: Secondary | ICD-10-CM | POA: Diagnosis not present

## 2015-08-03 DIAGNOSIS — M810 Age-related osteoporosis without current pathological fracture: Secondary | ICD-10-CM | POA: Diagnosis not present

## 2015-08-03 DIAGNOSIS — I10 Essential (primary) hypertension: Secondary | ICD-10-CM | POA: Diagnosis not present

## 2015-08-03 DIAGNOSIS — E222 Syndrome of inappropriate secretion of antidiuretic hormone: Secondary | ICD-10-CM | POA: Diagnosis not present

## 2015-08-03 DIAGNOSIS — M1990 Unspecified osteoarthritis, unspecified site: Secondary | ICD-10-CM | POA: Diagnosis not present

## 2015-08-03 DIAGNOSIS — I2510 Atherosclerotic heart disease of native coronary artery without angina pectoris: Secondary | ICD-10-CM | POA: Diagnosis not present

## 2015-08-04 DIAGNOSIS — M1990 Unspecified osteoarthritis, unspecified site: Secondary | ICD-10-CM | POA: Diagnosis not present

## 2015-08-04 DIAGNOSIS — I2510 Atherosclerotic heart disease of native coronary artery without angina pectoris: Secondary | ICD-10-CM | POA: Diagnosis not present

## 2015-08-04 DIAGNOSIS — I10 Essential (primary) hypertension: Secondary | ICD-10-CM | POA: Diagnosis not present

## 2015-08-04 DIAGNOSIS — E119 Type 2 diabetes mellitus without complications: Secondary | ICD-10-CM | POA: Diagnosis not present

## 2015-08-04 DIAGNOSIS — M810 Age-related osteoporosis without current pathological fracture: Secondary | ICD-10-CM | POA: Diagnosis not present

## 2015-08-04 DIAGNOSIS — E222 Syndrome of inappropriate secretion of antidiuretic hormone: Secondary | ICD-10-CM | POA: Diagnosis not present

## 2015-08-05 DIAGNOSIS — I2510 Atherosclerotic heart disease of native coronary artery without angina pectoris: Secondary | ICD-10-CM | POA: Diagnosis not present

## 2015-08-05 DIAGNOSIS — E119 Type 2 diabetes mellitus without complications: Secondary | ICD-10-CM | POA: Diagnosis not present

## 2015-08-05 DIAGNOSIS — M810 Age-related osteoporosis without current pathological fracture: Secondary | ICD-10-CM | POA: Diagnosis not present

## 2015-08-05 DIAGNOSIS — M1990 Unspecified osteoarthritis, unspecified site: Secondary | ICD-10-CM | POA: Diagnosis not present

## 2015-08-05 DIAGNOSIS — E222 Syndrome of inappropriate secretion of antidiuretic hormone: Secondary | ICD-10-CM | POA: Diagnosis not present

## 2015-08-05 DIAGNOSIS — I10 Essential (primary) hypertension: Secondary | ICD-10-CM | POA: Diagnosis not present

## 2015-08-08 DIAGNOSIS — I2510 Atherosclerotic heart disease of native coronary artery without angina pectoris: Secondary | ICD-10-CM | POA: Diagnosis not present

## 2015-08-08 DIAGNOSIS — E119 Type 2 diabetes mellitus without complications: Secondary | ICD-10-CM | POA: Diagnosis not present

## 2015-08-08 DIAGNOSIS — I10 Essential (primary) hypertension: Secondary | ICD-10-CM | POA: Diagnosis not present

## 2015-08-08 DIAGNOSIS — M810 Age-related osteoporosis without current pathological fracture: Secondary | ICD-10-CM | POA: Diagnosis not present

## 2015-08-08 DIAGNOSIS — E222 Syndrome of inappropriate secretion of antidiuretic hormone: Secondary | ICD-10-CM | POA: Diagnosis not present

## 2015-08-08 DIAGNOSIS — M1990 Unspecified osteoarthritis, unspecified site: Secondary | ICD-10-CM | POA: Diagnosis not present

## 2015-08-09 DIAGNOSIS — E222 Syndrome of inappropriate secretion of antidiuretic hormone: Secondary | ICD-10-CM | POA: Diagnosis not present

## 2015-08-09 DIAGNOSIS — I10 Essential (primary) hypertension: Secondary | ICD-10-CM | POA: Diagnosis not present

## 2015-08-09 DIAGNOSIS — I2510 Atherosclerotic heart disease of native coronary artery without angina pectoris: Secondary | ICD-10-CM | POA: Diagnosis not present

## 2015-08-09 DIAGNOSIS — M810 Age-related osteoporosis without current pathological fracture: Secondary | ICD-10-CM | POA: Diagnosis not present

## 2015-08-09 DIAGNOSIS — E119 Type 2 diabetes mellitus without complications: Secondary | ICD-10-CM | POA: Diagnosis not present

## 2015-08-09 DIAGNOSIS — M1990 Unspecified osteoarthritis, unspecified site: Secondary | ICD-10-CM | POA: Diagnosis not present

## 2015-08-10 DIAGNOSIS — M1990 Unspecified osteoarthritis, unspecified site: Secondary | ICD-10-CM | POA: Diagnosis not present

## 2015-08-10 DIAGNOSIS — E222 Syndrome of inappropriate secretion of antidiuretic hormone: Secondary | ICD-10-CM | POA: Diagnosis not present

## 2015-08-10 DIAGNOSIS — M810 Age-related osteoporosis without current pathological fracture: Secondary | ICD-10-CM | POA: Diagnosis not present

## 2015-08-10 DIAGNOSIS — I2510 Atherosclerotic heart disease of native coronary artery without angina pectoris: Secondary | ICD-10-CM | POA: Diagnosis not present

## 2015-08-10 DIAGNOSIS — E119 Type 2 diabetes mellitus without complications: Secondary | ICD-10-CM | POA: Diagnosis not present

## 2015-08-10 DIAGNOSIS — I10 Essential (primary) hypertension: Secondary | ICD-10-CM | POA: Diagnosis not present

## 2015-08-11 DIAGNOSIS — E119 Type 2 diabetes mellitus without complications: Secondary | ICD-10-CM | POA: Diagnosis not present

## 2015-08-11 DIAGNOSIS — M1990 Unspecified osteoarthritis, unspecified site: Secondary | ICD-10-CM | POA: Diagnosis not present

## 2015-08-11 DIAGNOSIS — M810 Age-related osteoporosis without current pathological fracture: Secondary | ICD-10-CM | POA: Diagnosis not present

## 2015-08-11 DIAGNOSIS — I10 Essential (primary) hypertension: Secondary | ICD-10-CM | POA: Diagnosis not present

## 2015-08-11 DIAGNOSIS — E222 Syndrome of inappropriate secretion of antidiuretic hormone: Secondary | ICD-10-CM | POA: Diagnosis not present

## 2015-08-11 DIAGNOSIS — I2510 Atherosclerotic heart disease of native coronary artery without angina pectoris: Secondary | ICD-10-CM | POA: Diagnosis not present

## 2015-08-15 DIAGNOSIS — E222 Syndrome of inappropriate secretion of antidiuretic hormone: Secondary | ICD-10-CM | POA: Diagnosis not present

## 2015-08-15 DIAGNOSIS — I2510 Atherosclerotic heart disease of native coronary artery without angina pectoris: Secondary | ICD-10-CM | POA: Diagnosis not present

## 2015-08-15 DIAGNOSIS — E119 Type 2 diabetes mellitus without complications: Secondary | ICD-10-CM | POA: Diagnosis not present

## 2015-08-15 DIAGNOSIS — M810 Age-related osteoporosis without current pathological fracture: Secondary | ICD-10-CM | POA: Diagnosis not present

## 2015-08-15 DIAGNOSIS — I10 Essential (primary) hypertension: Secondary | ICD-10-CM | POA: Diagnosis not present

## 2015-08-15 DIAGNOSIS — M1990 Unspecified osteoarthritis, unspecified site: Secondary | ICD-10-CM | POA: Diagnosis not present

## 2015-08-16 DIAGNOSIS — M1990 Unspecified osteoarthritis, unspecified site: Secondary | ICD-10-CM | POA: Diagnosis not present

## 2015-08-16 DIAGNOSIS — E119 Type 2 diabetes mellitus without complications: Secondary | ICD-10-CM | POA: Diagnosis not present

## 2015-08-16 DIAGNOSIS — E222 Syndrome of inappropriate secretion of antidiuretic hormone: Secondary | ICD-10-CM | POA: Diagnosis not present

## 2015-08-16 DIAGNOSIS — I10 Essential (primary) hypertension: Secondary | ICD-10-CM | POA: Diagnosis not present

## 2015-08-16 DIAGNOSIS — I2510 Atherosclerotic heart disease of native coronary artery without angina pectoris: Secondary | ICD-10-CM | POA: Diagnosis not present

## 2015-08-16 DIAGNOSIS — M810 Age-related osteoporosis without current pathological fracture: Secondary | ICD-10-CM | POA: Diagnosis not present

## 2015-08-17 DIAGNOSIS — M1990 Unspecified osteoarthritis, unspecified site: Secondary | ICD-10-CM | POA: Diagnosis not present

## 2015-08-17 DIAGNOSIS — I2510 Atherosclerotic heart disease of native coronary artery without angina pectoris: Secondary | ICD-10-CM | POA: Diagnosis not present

## 2015-08-17 DIAGNOSIS — I10 Essential (primary) hypertension: Secondary | ICD-10-CM | POA: Diagnosis not present

## 2015-08-17 DIAGNOSIS — E119 Type 2 diabetes mellitus without complications: Secondary | ICD-10-CM | POA: Diagnosis not present

## 2015-08-17 DIAGNOSIS — M810 Age-related osteoporosis without current pathological fracture: Secondary | ICD-10-CM | POA: Diagnosis not present

## 2015-08-17 DIAGNOSIS — E222 Syndrome of inappropriate secretion of antidiuretic hormone: Secondary | ICD-10-CM | POA: Diagnosis not present

## 2015-08-18 DIAGNOSIS — E222 Syndrome of inappropriate secretion of antidiuretic hormone: Secondary | ICD-10-CM | POA: Diagnosis not present

## 2015-08-18 DIAGNOSIS — M1990 Unspecified osteoarthritis, unspecified site: Secondary | ICD-10-CM | POA: Diagnosis not present

## 2015-08-18 DIAGNOSIS — E119 Type 2 diabetes mellitus without complications: Secondary | ICD-10-CM | POA: Diagnosis not present

## 2015-08-18 DIAGNOSIS — M810 Age-related osteoporosis without current pathological fracture: Secondary | ICD-10-CM | POA: Diagnosis not present

## 2015-08-18 DIAGNOSIS — I2510 Atherosclerotic heart disease of native coronary artery without angina pectoris: Secondary | ICD-10-CM | POA: Diagnosis not present

## 2015-08-18 DIAGNOSIS — I10 Essential (primary) hypertension: Secondary | ICD-10-CM | POA: Diagnosis not present

## 2015-08-25 DIAGNOSIS — I2510 Atherosclerotic heart disease of native coronary artery without angina pectoris: Secondary | ICD-10-CM | POA: Diagnosis not present

## 2015-08-25 DIAGNOSIS — E222 Syndrome of inappropriate secretion of antidiuretic hormone: Secondary | ICD-10-CM | POA: Diagnosis not present

## 2015-08-25 DIAGNOSIS — E119 Type 2 diabetes mellitus without complications: Secondary | ICD-10-CM | POA: Diagnosis not present

## 2015-08-25 DIAGNOSIS — M810 Age-related osteoporosis without current pathological fracture: Secondary | ICD-10-CM | POA: Diagnosis not present

## 2015-08-25 DIAGNOSIS — M1990 Unspecified osteoarthritis, unspecified site: Secondary | ICD-10-CM | POA: Diagnosis not present

## 2015-08-25 DIAGNOSIS — I10 Essential (primary) hypertension: Secondary | ICD-10-CM | POA: Diagnosis not present

## 2015-09-02 DIAGNOSIS — M810 Age-related osteoporosis without current pathological fracture: Secondary | ICD-10-CM | POA: Diagnosis not present

## 2015-09-02 DIAGNOSIS — E222 Syndrome of inappropriate secretion of antidiuretic hormone: Secondary | ICD-10-CM | POA: Diagnosis not present

## 2015-09-02 DIAGNOSIS — I10 Essential (primary) hypertension: Secondary | ICD-10-CM | POA: Diagnosis not present

## 2015-09-02 DIAGNOSIS — I2510 Atherosclerotic heart disease of native coronary artery without angina pectoris: Secondary | ICD-10-CM | POA: Diagnosis not present

## 2015-09-02 DIAGNOSIS — M1990 Unspecified osteoarthritis, unspecified site: Secondary | ICD-10-CM | POA: Diagnosis not present

## 2015-09-02 DIAGNOSIS — E119 Type 2 diabetes mellitus without complications: Secondary | ICD-10-CM | POA: Diagnosis not present

## 2015-09-08 DIAGNOSIS — I10 Essential (primary) hypertension: Secondary | ICD-10-CM | POA: Diagnosis not present

## 2015-09-08 DIAGNOSIS — M810 Age-related osteoporosis without current pathological fracture: Secondary | ICD-10-CM | POA: Diagnosis not present

## 2015-09-08 DIAGNOSIS — E119 Type 2 diabetes mellitus without complications: Secondary | ICD-10-CM | POA: Diagnosis not present

## 2015-09-08 DIAGNOSIS — I2510 Atherosclerotic heart disease of native coronary artery without angina pectoris: Secondary | ICD-10-CM | POA: Diagnosis not present

## 2015-09-08 DIAGNOSIS — E222 Syndrome of inappropriate secretion of antidiuretic hormone: Secondary | ICD-10-CM | POA: Diagnosis not present

## 2015-09-08 DIAGNOSIS — M1990 Unspecified osteoarthritis, unspecified site: Secondary | ICD-10-CM | POA: Diagnosis not present

## 2015-09-15 DIAGNOSIS — E222 Syndrome of inappropriate secretion of antidiuretic hormone: Secondary | ICD-10-CM | POA: Diagnosis not present

## 2015-09-15 DIAGNOSIS — E119 Type 2 diabetes mellitus without complications: Secondary | ICD-10-CM | POA: Diagnosis not present

## 2015-09-15 DIAGNOSIS — M810 Age-related osteoporosis without current pathological fracture: Secondary | ICD-10-CM | POA: Diagnosis not present

## 2015-09-15 DIAGNOSIS — I10 Essential (primary) hypertension: Secondary | ICD-10-CM | POA: Diagnosis not present

## 2015-09-15 DIAGNOSIS — I2510 Atherosclerotic heart disease of native coronary artery without angina pectoris: Secondary | ICD-10-CM | POA: Diagnosis not present

## 2015-09-15 DIAGNOSIS — M1990 Unspecified osteoarthritis, unspecified site: Secondary | ICD-10-CM | POA: Diagnosis not present

## 2015-09-22 ENCOUNTER — Other Ambulatory Visit: Payer: Self-pay | Admitting: Cardiology

## 2015-09-22 DIAGNOSIS — I2510 Atherosclerotic heart disease of native coronary artery without angina pectoris: Secondary | ICD-10-CM | POA: Diagnosis not present

## 2015-09-22 DIAGNOSIS — E222 Syndrome of inappropriate secretion of antidiuretic hormone: Secondary | ICD-10-CM | POA: Diagnosis not present

## 2015-09-22 DIAGNOSIS — M1990 Unspecified osteoarthritis, unspecified site: Secondary | ICD-10-CM | POA: Diagnosis not present

## 2015-09-22 DIAGNOSIS — E119 Type 2 diabetes mellitus without complications: Secondary | ICD-10-CM | POA: Diagnosis not present

## 2015-09-22 DIAGNOSIS — I10 Essential (primary) hypertension: Secondary | ICD-10-CM | POA: Diagnosis not present

## 2015-09-22 DIAGNOSIS — I6523 Occlusion and stenosis of bilateral carotid arteries: Secondary | ICD-10-CM

## 2015-09-22 DIAGNOSIS — M810 Age-related osteoporosis without current pathological fracture: Secondary | ICD-10-CM | POA: Diagnosis not present

## 2015-10-11 DIAGNOSIS — D485 Neoplasm of uncertain behavior of skin: Secondary | ICD-10-CM | POA: Diagnosis not present

## 2015-10-11 DIAGNOSIS — R234 Changes in skin texture: Secondary | ICD-10-CM | POA: Diagnosis not present

## 2015-10-11 DIAGNOSIS — I10 Essential (primary) hypertension: Secondary | ICD-10-CM | POA: Diagnosis not present

## 2015-10-11 DIAGNOSIS — F411 Generalized anxiety disorder: Secondary | ICD-10-CM | POA: Diagnosis not present

## 2015-10-11 DIAGNOSIS — E039 Hypothyroidism, unspecified: Secondary | ICD-10-CM | POA: Diagnosis not present

## 2015-10-11 DIAGNOSIS — L570 Actinic keratosis: Secondary | ICD-10-CM | POA: Diagnosis not present

## 2015-10-13 ENCOUNTER — Ambulatory Visit: Payer: Medicare Other

## 2015-10-13 DIAGNOSIS — I6523 Occlusion and stenosis of bilateral carotid arteries: Secondary | ICD-10-CM | POA: Diagnosis not present

## 2015-10-14 ENCOUNTER — Telehealth: Payer: Self-pay | Admitting: *Deleted

## 2015-10-14 NOTE — Telephone Encounter (Signed)
-----   Message from Satira Sark, MD sent at 10/14/2015  3:20 PM EST ----- Reviewed. Stable 1-39% bilateral ICA stenosis.

## 2015-10-14 NOTE — Telephone Encounter (Signed)
Patient informed. 

## 2015-10-31 ENCOUNTER — Encounter: Payer: Self-pay | Admitting: Cardiology

## 2015-10-31 ENCOUNTER — Other Ambulatory Visit: Payer: Self-pay | Admitting: *Deleted

## 2015-10-31 ENCOUNTER — Ambulatory Visit (INDEPENDENT_AMBULATORY_CARE_PROVIDER_SITE_OTHER): Payer: Medicare Other | Admitting: Cardiology

## 2015-10-31 VITALS — BP 190/73 | HR 88 | Ht 64.0 in | Wt 156.6 lb

## 2015-10-31 DIAGNOSIS — I10 Essential (primary) hypertension: Secondary | ICD-10-CM | POA: Diagnosis not present

## 2015-10-31 DIAGNOSIS — I5032 Chronic diastolic (congestive) heart failure: Secondary | ICD-10-CM

## 2015-10-31 DIAGNOSIS — I6523 Occlusion and stenosis of bilateral carotid arteries: Secondary | ICD-10-CM | POA: Diagnosis not present

## 2015-10-31 DIAGNOSIS — I2510 Atherosclerotic heart disease of native coronary artery without angina pectoris: Secondary | ICD-10-CM | POA: Diagnosis not present

## 2015-10-31 MED ORDER — POTASSIUM CHLORIDE ER 10 MEQ PO TBCR
EXTENDED_RELEASE_TABLET | ORAL | Status: DC
Start: 2015-10-31 — End: 2019-03-06

## 2015-10-31 MED ORDER — FUROSEMIDE 20 MG PO TABS: 20.0000 mg | ORAL_TABLET | Freq: Every day | ORAL | Status: DC

## 2015-10-31 NOTE — Progress Notes (Signed)
Cardiology Office Note  Date: 10/31/2015   ID: Lisa Kemp, DOB 10/03/1923, MRN [id number]  PCP: Glenda Chroman., MD  Primary Cardiologist: Rozann Lesches, MD   Chief Complaint  Patient presents with  . Coronary Artery Disease    History of Present Illness: Lisa Kemp is an 82 y.o. female last seen in April 2016. She is here today with her daughter for a follow-up visit. She has gained a significant amount of weight since our last encounter and feels more short of breath with activity. She uses a walker to ambulate, has had no recent falls. She is living at home and has home health. She was in the [facility] for a little over a month for rehabilitation and management of electrolyte abnormalities.  She had recent follow-up carotid studies which are outlined below. She has mild bilateral ICA disease.  We reviewed her medications which are outlined below. She has been using her Lasix only as needed, perhaps twice a week.  Past Medical History  Diagnosis Date  . Mixed hyperlipidemia     Statin intolerant  . Gastrointestinal bleed     Related to NSAIDs  . Coronary atherosclerosis of native coronary artery     Nonobstructive at catherization 2006  . Hiatal hernia     Moderate sized sliding hiatal hernia  . Esophageal ring     Distal esophageal ring status post dilation  . Colon polyps   . GERD (gastroesophageal reflux disease)   . Arthritis   . Hypothyroidism   . Hypertension   . Intracranial hemorrhage (HCC)     Left subcortical 2015    Current Outpatient Prescriptions  Medication Sig Dispense Refill  . aspirin 81 MG tablet Take 81 mg by mouth daily.    [name] atenolol (TENORMIN) 25 MG tablet Take 1 tablet (25 mg total) by mouth 2 (two) times daily. 180 tablet 3  . calcium-vitamin D (OSCAL WITH D) 500-200 MG-UNIT per tablet Take 1 tablet by mouth 2 (two) times daily.      . Coenzyme Q10 (COQ-10) 200 MG CAPS Take 1 capsule by mouth daily.      .  furosemide (LASIX) 20 MG tablet Take 1 tablet (20 mg total) by mouth daily. 30 tablet 3  . gabapentin (NEURONTIN) 100 MG capsule Take 1 capsule (100 mg total) by mouth at bedtime. 30 capsule 1  . levothyroxine (SYNTHROID, LEVOTHROID) 112 MCG tablet Take 1 tablet (112 mcg total) by mouth daily. 30 tablet 1  . LORazepam (ATIVAN) 2 MG tablet Take 1 tablet (2 mg total) by mouth at bedtime. (Patient taking differently: Take 1-2 mg by mouth 2 (two) times daily. 1MG  IN THE MORNING AND 2MG  AT BEDTIME) 30 tablet 0  . Magnesium 250 MG TABS Take 1 tablet by mouth daily.      . nitroGLYCERIN (NITROSTAT) 0.4 MG SL tablet Place 1 tablet (0.4 mg total) under the tongue as directed. 25 tablet 3  . potassium chloride (K-DUR) 10 MEQ tablet TAKE 1 tab daily with lasix 30 tablet 3  . ranitidine (ZANTAC) 150 MG tablet Take 150-300 mg by mouth 2 (two) times daily. 2 tablets in AM and 1 tablet in PM     No current facility-administered medications for this visit.   Allergies:  Diazepam and Statins   Social History: The patient  reports that she has never smoked. She has never used smokeless tobacco. She reports that she does not drink alcohol or use illicit drugs.   ROS:  Please see the history of present illness. Otherwise, complete review of systems is positive for her mid leg swelling, left knee swelling, occasional nausea.  All other systems are reviewed and negative.   Physical Exam: VS:  BP 190/73 mmHg  Pulse 88  Ht 5\' 4"  (1.626 m)  Wt 156 lb 9.6 oz (71.033 kg)  BMI 26.87 kg/m2  SpO2 98%, BMI Body mass index is 26.87 kg/(m^2).  Wt Readings from Last 3 Encounters:  10/31/15 156 lb 9.6 oz (71.033 kg)  03/22/15 140 lb (63.504 kg)  12/14/14 144 lb 12.8 oz (65.681 kg)    Elderly woman, appears comfortable at rest.  HEENT: Conjunctiva and lids normal, oropharynx with moist mucosa.  Neck: Supple, no bruits, no thyromegaly.  Lungs: Clear to auscultation, diminished, nonlabored.  Cardiac: Regular rate  and rhythm, soft systolic murmur at the base, no S3 gallop.  Abdomen: Soft, nontender, bowel sounds present.  Skin: Warm and dry.  Extremities: 1+ leg edema distal pulses 1-2+.   ECG: I personally reviewed the prior tracing from 03/22/2015 which showed normal sinus rhythm with rightward axis.  Recent Labwork: 03/22/2015: BUN 25*; Creatinine, Ser 1.16*; Hemoglobin 12.6; Platelets 224; Potassium 4.1; Sodium 129*     Component Value Date/Time   CHOL 157 12/23/2013 0228   TRIG 188* 12/23/2013 0228   HDL 35* 12/23/2013 0228   CHOLHDL 4.5 12/23/2013 0228   VLDL 38 12/23/2013 0228   LDLCALC 84 12/23/2013 0228    Other Studies Reviewed Today:  Carotid Dopplers 10/13/2015: Stable 1-39% bilateral ICA stenoses.  Assessment and Plan:  1. Weight gain and suspected fluid overload, likely diastolic heart failure based on prior evaluation. Plan is to change Lasix to 20 mg daily with potassium supplement. Follow-up arranged in the next month with BMET.  2. Essential hypertension, blood pressure significantly elevated today. She is on atenolol, may need an additional agent if fluid volume reduction is not effective. Keep follow-up with Dr. Woody Seller.  3. History of nonobstructive CAD. No active angina.  Current medicines were reviewed with the patient today.   Orders Placed This Encounter  Procedures  . Basic metabolic panel    Disposition: FU with me in 1 month.   Signed, Satira Sark, MD, [facility] 10/31/2015 2:18 PM    [facility] [place], [place], [place] [address] Phone: [phone]; Fax: [phone]

## 2015-10-31 NOTE — Patient Instructions (Signed)
Your physician recommends that you schedule a follow-up appointment in: 1 month with Dr. Domenic Polite  Your physician has recommended you make the following change in your medication:   TAKE LASIX [address] [place] [place]  Your physician recommends that you return for lab work JUST PRIOR TO YOUR NEXT OFFICE VISIT  Thank you for choosing [facility]!!

## 2015-11-01 DIAGNOSIS — H524 Presbyopia: Secondary | ICD-10-CM | POA: Diagnosis not present

## 2015-11-01 DIAGNOSIS — Z961 Presence of intraocular lens: Secondary | ICD-10-CM | POA: Diagnosis not present

## 2015-11-01 DIAGNOSIS — H40013 Open angle with borderline findings, low risk, bilateral: Secondary | ICD-10-CM | POA: Diagnosis not present

## 2015-11-01 DIAGNOSIS — H353131 Nonexudative age-related macular degeneration, bilateral, early dry stage: Secondary | ICD-10-CM | POA: Diagnosis not present

## 2015-11-11 DIAGNOSIS — I639 Cerebral infarction, unspecified: Secondary | ICD-10-CM | POA: Diagnosis not present

## 2015-11-11 DIAGNOSIS — I2510 Atherosclerotic heart disease of native coronary artery without angina pectoris: Secondary | ICD-10-CM | POA: Diagnosis not present

## 2015-11-11 DIAGNOSIS — I10 Essential (primary) hypertension: Secondary | ICD-10-CM | POA: Diagnosis not present

## 2015-11-28 ENCOUNTER — Telehealth: Payer: Self-pay | Admitting: *Deleted

## 2015-11-28 NOTE — Telephone Encounter (Signed)
Pt had friend [name]) call says pt c/o SOB/weakness when standing. Says she has been taking lasix daily with KCl and has stopped atenolol since last week due to BP being to low according to pt friend 109/51, 121/54 were readings before atenolol was stopped did not have HR readings. Says pt denies chest pain but has noticed some chest tightness when walking with SOB/weakness. Says pt doesn't have any symptoms when laying or sitting down. Told her that with time, this may not be addressed until tomorrow and that if symptoms worsen to report to ED.  Will forward to Dr. Domenic Polite.

## 2015-11-29 NOTE — Telephone Encounter (Signed)
Has she been checking daily weights? At last visit her weight was up significantly and that is when Lasix was adjusted. She is scheduled to see me back next week in the office with BMET.

## 2015-11-29 NOTE — Telephone Encounter (Signed)
[name] called the office back wanting to know if anyone would be calling Lisa Kemp today.

## 2015-11-29 NOTE — Telephone Encounter (Signed)
Lisa Kemp called for patient and nurse advised her that she is not listed on the DPR to speak to about patient's chart. Patient's weights are the same ranging from 141 lbs-143 lbs and no c/o edema.  Patient is eating and drinking okay. No c/o nv/ diarrhea, chills, fever, chest pain or dizziness. Patient c/o coughing w/o congestion for 2 days. BP 109/51 & HR 92 , BP 109/53 & HR 91. Patient advised to contact her PCP r/e sob and weakness. Patient also informed that if her symptoms get worse, that she needed to go to the ED for an evaluation. Patient verbalized understanding of plan.

## 2015-11-30 DIAGNOSIS — I2510 Atherosclerotic heart disease of native coronary artery without angina pectoris: Secondary | ICD-10-CM | POA: Diagnosis present

## 2015-11-30 DIAGNOSIS — Z7982 Long term (current) use of aspirin: Secondary | ICD-10-CM | POA: Diagnosis not present

## 2015-11-30 DIAGNOSIS — R0600 Dyspnea, unspecified: Secondary | ICD-10-CM | POA: Diagnosis not present

## 2015-11-30 DIAGNOSIS — D649 Anemia, unspecified: Secondary | ICD-10-CM | POA: Diagnosis not present

## 2015-11-30 DIAGNOSIS — Z888 Allergy status to other drugs, medicaments and biological substances status: Secondary | ICD-10-CM | POA: Diagnosis not present

## 2015-11-30 DIAGNOSIS — Z79899 Other long term (current) drug therapy: Secondary | ICD-10-CM | POA: Diagnosis not present

## 2015-11-30 DIAGNOSIS — I517 Cardiomegaly: Secondary | ICD-10-CM | POA: Diagnosis not present

## 2015-11-30 DIAGNOSIS — R5383 Other fatigue: Secondary | ICD-10-CM | POA: Diagnosis not present

## 2015-11-30 DIAGNOSIS — D500 Iron deficiency anemia secondary to blood loss (chronic): Secondary | ICD-10-CM | POA: Diagnosis present

## 2015-11-30 DIAGNOSIS — I959 Hypotension, unspecified: Secondary | ICD-10-CM | POA: Diagnosis not present

## 2015-11-30 DIAGNOSIS — Z8673 Personal history of transient ischemic attack (TIA), and cerebral infarction without residual deficits: Secondary | ICD-10-CM | POA: Diagnosis not present

## 2015-11-30 DIAGNOSIS — K922 Gastrointestinal hemorrhage, unspecified: Secondary | ICD-10-CM | POA: Diagnosis not present

## 2015-11-30 DIAGNOSIS — M810 Age-related osteoporosis without current pathological fracture: Secondary | ICD-10-CM | POA: Diagnosis present

## 2015-12-05 ENCOUNTER — Encounter: Payer: Self-pay | Admitting: *Deleted

## 2015-12-08 ENCOUNTER — Ambulatory Visit (INDEPENDENT_AMBULATORY_CARE_PROVIDER_SITE_OTHER): Payer: Medicare Other | Admitting: Cardiology

## 2015-12-08 ENCOUNTER — Encounter: Payer: Self-pay | Admitting: Cardiology

## 2015-12-08 VITALS — BP 138/70 | HR 86 | Ht 64.0 in | Wt 154.0 lb

## 2015-12-08 DIAGNOSIS — I10 Essential (primary) hypertension: Secondary | ICD-10-CM | POA: Diagnosis not present

## 2015-12-08 DIAGNOSIS — I2510 Atherosclerotic heart disease of native coronary artery without angina pectoris: Secondary | ICD-10-CM | POA: Diagnosis not present

## 2015-12-08 DIAGNOSIS — D500 Iron deficiency anemia secondary to blood loss (chronic): Secondary | ICD-10-CM

## 2015-12-08 DIAGNOSIS — I6523 Occlusion and stenosis of bilateral carotid arteries: Secondary | ICD-10-CM | POA: Diagnosis not present

## 2015-12-08 DIAGNOSIS — I5032 Chronic diastolic (congestive) heart failure: Secondary | ICD-10-CM | POA: Diagnosis not present

## 2015-12-08 NOTE — Progress Notes (Signed)
Cardiology Office Note  Date: 12/08/2015   ID: Lisa Kemp, DOB 1923/12/25, MRN [id number]  PCP: Glenda Chroman., MD  Primary Cardiologist: Rozann Lesches, MD   Chief Complaint  Patient presents with  . Diastolic heart failure  . Hospitalization Follow-up    History of Present Illness: Lisa Kemp is an 82 y.o. female last seen in March. At that time we modified her diuretic regimen due to suspected volume overload. Subsequent telephone notes reviewed, patient complained of continued weakness and dizziness. No documented hypotension based on home blood pressure checks. She was referred back to her PCP subsequently with finding ultimately of severe anemia, hemoglobin 5.8. She was hospitalized at [facility]. She was transfused with PRBCs, underwent EGD and colonoscopy which were normal. Discharge summary is not available for review of subsequent plan.  She is here today for follow-up, states she feels much better in general. Using a walker to ambulate, no dizziness or falls. We went over her medications. She was not taken off of aspirin, is on Zantac. She is also on iron supplements. She states that she saw Dr. Laural Golden years ago for colonoscopy and at one point was on PPI with iron supplements as well. She has a prior history of GI bleeding due to NSAIDs.  Weight is been relatively stable. I reviewed her recent lab work.  Past Medical History  Diagnosis Date  . Mixed hyperlipidemia     Statin intolerant  . Gastrointestinal bleed     Related to NSAIDs  . Coronary atherosclerosis of native coronary artery     Nonobstructive at catherization 2006  . Hiatal hernia     Moderate sized sliding hiatal hernia  . Esophageal ring     Distal esophageal ring status post dilation  . Colon polyps   . GERD (gastroesophageal reflux disease)   . Arthritis   . Hypothyroidism   . Hypertension   . Intracranial hemorrhage ([place])     Left subcortical 2015    Past Surgical History    Procedure Laterality Date  . Appendectomy    . Cataract extraction    . Total abdominal hysterectomy w/ bilateral salpingoophorectomy    . Total abdominal hysterectomy    . Colon surgery      Current Outpatient Prescriptions  Medication Sig Dispense Refill  . aspirin 81 MG tablet Take 81 mg by mouth daily.    . calcium-vitamin D (OSCAL WITH D) 500-200 MG-UNIT per tablet Take 1 tablet by mouth 2 (two) times daily.      . Coenzyme Q10 (COQ-10) 200 MG CAPS Take 1 capsule by mouth daily.      . Ferrous Fumarate (FERROCITE PO) Take 325 mg by mouth.    . furosemide (LASIX) 20 MG tablet Take 1 tablet (20 mg total) by mouth daily. 30 tablet 3  . gabapentin (NEURONTIN) 100 MG capsule Take 1 capsule (100 mg total) by mouth at bedtime. 30 capsule 1  . levothyroxine (SYNTHROID, LEVOTHROID) 112 MCG tablet Take 1 tablet (112 mcg total) by mouth daily. 30 tablet 1  . LORazepam (ATIVAN) 2 MG tablet Take 1 tablet (2 mg total) by mouth at bedtime. (Patient taking differently: Take 1-2 mg by mouth 2 (two) times daily. 1MG  IN THE MORNING AND 2MG  AT BEDTIME) 30 tablet 0  . Magnesium 250 MG TABS Take 1 tablet by mouth daily.      . nitroGLYCERIN (NITROSTAT) 0.4 MG SL tablet Place 1 tablet (0.4 mg total) under the tongue as directed. 25 tablet  3  . [place] (OYSTER CALCIUM) 500 MG TABS tablet Take 500 mg of elemental calcium by mouth daily.    . potassium chloride (K-DUR) 10 MEQ tablet TAKE 1 tab daily with lasix 30 tablet 3  . ranitidine (ZANTAC) 150 MG tablet Take 150-300 mg by mouth 2 (two) times daily. 2 tablets in AM and 1 tablet in PM     No current facility-administered medications for this visit.   Allergies:  Diazepam and Statins   Social History: The patient  reports that she has never smoked. She has never used smokeless tobacco. She reports that she does not drink alcohol or use illicit drugs.   ROS:  Please see the history of present illness. Otherwise, complete review of systems is  positive for decreased hearing, NYHA class II dyspnea.  All other systems are reviewed and negative.   Physical Exam: VS:  BP 138/70 mmHg  Pulse 86  Ht 5\' 4"  (1.626 m)  Wt 154 lb (69.854 kg)  BMI 26.42 kg/m2  SpO2 95%, BMI Body mass index is 26.42 kg/(m^2).  Wt Readings from Last 3 Encounters:  12/08/15 154 lb (69.854 kg)  10/31/15 156 lb 9.6 oz (71.033 kg)  03/22/15 140 lb (63.504 kg)    Elderly woman, appears comfortable at rest.  HEENT: Conjunctiva and lids normal, oropharynx with moist mucosa.  Neck: Supple, no bruits, no thyromegaly.  Lungs: Clear to auscultation, diminished, nonlabored.  Cardiac: Regular rate and rhythm, soft systolic murmur at the base, no S3 gallop.  Abdomen: Soft, nontender, bowel sounds present.  Skin: Warm and dry.  Extremities: 1+ leg edema distal pulses 1-2+.   ECG: I personally reviewed the prior tracing from 04/16/2015 which showed sinus rhythm with nonspecific ST changes.  Recent Labwork: 03/22/2015: BUN 25*; Creatinine, Ser 1.16*; Hemoglobin 12.6; Platelets 224; Potassium 4.1; Sodium 129*     Component Value Date/Time   CHOL 157 12/23/2013 0228   TRIG 188* 12/23/2013 0228   HDL 35* 12/23/2013 0228   CHOLHDL 4.5 12/23/2013 0228   VLDL 38 12/23/2013 0228   LDLCALC 84 12/23/2013 0228  April 2017: BUN 15, creatinine 1.0, potassium 4.2, hemoglobin 10.7  Other Studies Reviewed Today:  Echocardiogram 12/23/2013: Study Conclusions  - Left ventricle: The cavity size was normal. Wall thickness was normal. Systolic function was vigorous. The estimated ejection fraction was in the range of 65% to 70%. Wall motion was normal; there were no regional wall motion abnormalities. - Mitral valve: Moderate regurgitation directed eccentrically and posteriorly. - Left atrium: The atrium was mildly dilated. Impressions:  - Image quality limits evaluation of the mitral valve. Cannot exclude systolic anterior motion of the  anterior leaflet as the underlying mechansm for MR.  Assessment and Plan:  1. Patient recently hospitalized with severe anemia, hemoglobin down to 5.8. No obvious GI source noted by EGD or colonoscopy based on available records. Not certain about plan at discharge. She states that she has seen Dr. Laural Golden previously, would recommend follow-up GI evaluation. May have been a small bowel bleed. She sees Dr. Woody Seller next week.  2. Diastolic heart failure, overall stable. Continue current diuretic regimen.  3. Nonobstructive CAD, no active angina symptoms. She continues on aspirin for now.  4. Essential hypertension, blood pressure is adequately controlled today.  Current medicines were reviewed with the patient today.  Disposition: FU with me in 3 months.   Signed, Satira Sark, MD, [facility] 12/08/2015 2:25 PM    [facility] at [facility]  [place], [place], [place] [address] Phone: [phone]; Fax: [phone]

## 2015-12-08 NOTE — Patient Instructions (Signed)
Your physician recommends that you continue on your current medications as directed. Please refer to the Current Medication list given to you today. Your physician recommends that you schedule a follow-up appointment in: 3 months.  Dr. Olevia Perches office number is [phone].

## 2015-12-13 ENCOUNTER — Telehealth: Payer: Self-pay | Admitting: Cardiology

## 2015-12-13 DIAGNOSIS — Z09 Encounter for follow-up examination after completed treatment for conditions other than malignant neoplasm: Secondary | ICD-10-CM | POA: Diagnosis not present

## 2015-12-13 DIAGNOSIS — D509 Iron deficiency anemia, unspecified: Secondary | ICD-10-CM | POA: Diagnosis not present

## 2015-12-13 NOTE — Telephone Encounter (Signed)
Lisa Kemp called stating that while she was in the Nursing Home they discontinued her Iron.  Patient wants to know if she is suppose to continue taking iron.

## 2015-12-13 NOTE — Telephone Encounter (Signed)
Patient said that she saw her PCP today Woody Seller) and she asked him whether or not she needed to go back on the iron pill and he told her to stay on the iron tablet. Patient advised that she should follow her PCP's instructions. Patient verbalized understanding.

## 2015-12-20 ENCOUNTER — Ambulatory Visit (INDEPENDENT_AMBULATORY_CARE_PROVIDER_SITE_OTHER): Payer: Medicare Other | Admitting: Internal Medicine

## 2015-12-20 ENCOUNTER — Encounter (INDEPENDENT_AMBULATORY_CARE_PROVIDER_SITE_OTHER): Payer: Self-pay | Admitting: Internal Medicine

## 2015-12-20 VITALS — BP 130/76 | HR 74 | Temp 98.3°F | Resp 18 | Ht 63.0 in | Wt 150.9 lb

## 2015-12-20 DIAGNOSIS — K219 Gastro-esophageal reflux disease without esophagitis: Secondary | ICD-10-CM | POA: Diagnosis not present

## 2015-12-20 DIAGNOSIS — R110 Nausea: Secondary | ICD-10-CM

## 2015-12-20 DIAGNOSIS — D509 Iron deficiency anemia, unspecified: Secondary | ICD-10-CM | POA: Diagnosis not present

## 2015-12-20 DIAGNOSIS — I6523 Occlusion and stenosis of bilateral carotid arteries: Secondary | ICD-10-CM

## 2015-12-20 MED ORDER — ONDANSETRON 4 MG PO TBDP: 4.0000 mg | ORAL_TABLET | Freq: Two times a day (BID) | ORAL | Status: DC | PRN

## 2015-12-20 NOTE — Patient Instructions (Signed)
Physician will call with results of blood tests when completed. Hemoccult 3.  Please do not take iron pills for now.
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
## 2015-12-21 LAB — IRON AND TIBC
%SAT: 23 % (ref 11–50)
Iron: 77 ug/dL (ref 45–160)
TIBC: 333 ug/dL (ref 250–450)
UIBC: 256 ug/dL (ref 125–400)

## 2015-12-21 LAB — FERRITIN: Ferritin: 22 ng/mL (ref 20–288)

## 2015-12-26 ENCOUNTER — Telehealth (INDEPENDENT_AMBULATORY_CARE_PROVIDER_SITE_OTHER): Payer: Self-pay | Admitting: *Deleted

## 2015-12-26 DIAGNOSIS — D509 Iron deficiency anemia, unspecified: Secondary | ICD-10-CM | POA: Diagnosis not present

## 2015-12-26 LAB — HEMOGLOBIN AND HEMATOCRIT, BLOOD
HCT: 37 % (ref 35.0–45.0)
HEMOGLOBIN: 11.6 g/dL — AB (ref 11.7–15.5)

## 2015-12-26 NOTE — Telephone Encounter (Signed)
   Diagnosis:    Result(s)   Card 1: Positive - Card collected 12/22/2015    Card 2: Negative:Card collected 12/20/2015   Card 3:  Negative:Card collected 12/24/2015    Completed by: Thomas Hoff ,LPN   HEMOCCULT SENSA DEVELOPER: LOT#: [id number]  EXPIRATION DATE: 9-17   HEMOCCULT SENSA CARD:  LOT#: [id number]  EXPIRATION DATE: 07/18   CARD CONTROL RESULTS:  POSITIVE: Positive NEGATIVE: Negative    ADDITIONAL COMMENTS: Patient's daughter in law was called and given results.

## 2015-12-26 NOTE — Telephone Encounter (Signed)
Per Dr.Rehman the patient will need to have labs drawn. 

## 2015-12-27 DIAGNOSIS — I639 Cerebral infarction, unspecified: Secondary | ICD-10-CM | POA: Diagnosis not present

## 2015-12-27 DIAGNOSIS — I10 Essential (primary) hypertension: Secondary | ICD-10-CM | POA: Diagnosis not present

## 2015-12-27 DIAGNOSIS — I2510 Atherosclerotic heart disease of native coronary artery without angina pectoris: Secondary | ICD-10-CM | POA: Diagnosis not present

## 2015-12-27 NOTE — Telephone Encounter (Signed)
Hemoccult results noted. One out of 3 positive.

## 2016-01-02 ENCOUNTER — Telehealth (INDEPENDENT_AMBULATORY_CARE_PROVIDER_SITE_OTHER): Payer: Self-pay | Admitting: *Deleted

## 2016-01-02 DIAGNOSIS — D509 Iron deficiency anemia, unspecified: Secondary | ICD-10-CM

## 2016-01-02 NOTE — Telephone Encounter (Signed)
Per Dr.Rehman the patient will need to have labs drawn in 6 weeks and a OV in 3 months. Lab is noted for 02/13/2016.

## 2016-01-02 NOTE — Telephone Encounter (Signed)
Patient was called and given an appointment for 04/10/16 at 3:00pm

## 2016-01-02 NOTE — Telephone Encounter (Signed)
Patient was called and given an appointment for 04/10/16 at 3:00pm.

## 2016-01-12 ENCOUNTER — Other Ambulatory Visit (INDEPENDENT_AMBULATORY_CARE_PROVIDER_SITE_OTHER): Payer: Self-pay | Admitting: *Deleted

## 2016-01-12 ENCOUNTER — Encounter (INDEPENDENT_AMBULATORY_CARE_PROVIDER_SITE_OTHER): Payer: Self-pay | Admitting: *Deleted

## 2016-01-12 DIAGNOSIS — D509 Iron deficiency anemia, unspecified: Secondary | ICD-10-CM

## 2016-01-16 ENCOUNTER — Encounter (INDEPENDENT_AMBULATORY_CARE_PROVIDER_SITE_OTHER): Payer: Self-pay

## 2016-01-17 ENCOUNTER — Other Ambulatory Visit (INDEPENDENT_AMBULATORY_CARE_PROVIDER_SITE_OTHER): Payer: Self-pay | Admitting: Internal Medicine

## 2016-01-18 DIAGNOSIS — Z6828 Body mass index (BMI) 28.0-28.9, adult: Secondary | ICD-10-CM | POA: Diagnosis not present

## 2016-01-18 DIAGNOSIS — Z79899 Other long term (current) drug therapy: Secondary | ICD-10-CM | POA: Diagnosis not present

## 2016-01-18 DIAGNOSIS — Z1389 Encounter for screening for other disorder: Secondary | ICD-10-CM | POA: Diagnosis not present

## 2016-01-18 DIAGNOSIS — Z0000 Encounter for general adult medical examination without abnormal findings: Secondary | ICD-10-CM | POA: Diagnosis not present

## 2016-01-18 DIAGNOSIS — E039 Hypothyroidism, unspecified: Secondary | ICD-10-CM | POA: Diagnosis not present

## 2016-01-18 DIAGNOSIS — R5383 Other fatigue: Secondary | ICD-10-CM | POA: Diagnosis not present

## 2016-01-18 DIAGNOSIS — Z7189 Other specified counseling: Secondary | ICD-10-CM | POA: Diagnosis not present

## 2016-01-18 DIAGNOSIS — Z299 Encounter for prophylactic measures, unspecified: Secondary | ICD-10-CM | POA: Diagnosis not present

## 2016-02-14 DIAGNOSIS — D509 Iron deficiency anemia, unspecified: Secondary | ICD-10-CM | POA: Diagnosis not present

## 2016-02-14 LAB — HEMOGLOBIN AND HEMATOCRIT, BLOOD
HEMATOCRIT: 37.2 % (ref 35.0–45.0)
Hemoglobin: 12 g/dL (ref 11.7–15.5)

## 2016-02-15 ENCOUNTER — Telehealth (INDEPENDENT_AMBULATORY_CARE_PROVIDER_SITE_OTHER): Payer: Self-pay | Admitting: *Deleted

## 2016-02-15 DIAGNOSIS — D509 Iron deficiency anemia, unspecified: Secondary | ICD-10-CM

## 2016-02-15 NOTE — Telephone Encounter (Signed)
Per Dr.Rehman the patient will need to have labs drawn in 3 months 

## 2016-02-21 ENCOUNTER — Other Ambulatory Visit: Payer: Self-pay | Admitting: Cardiology

## 2016-03-07 ENCOUNTER — Other Ambulatory Visit: Payer: Self-pay | Admitting: Cardiology

## 2016-03-07 ENCOUNTER — Telehealth: Payer: Self-pay | Admitting: Cardiology

## 2016-03-07 NOTE — Telephone Encounter (Signed)
Atenlol 25 mg patient is out medication  Please send new RX to [facility]

## 2016-03-08 NOTE — Telephone Encounter (Signed)
Medication sent to pharmacy  

## 2016-03-09 ENCOUNTER — Encounter: Payer: Self-pay | Admitting: Cardiology

## 2016-03-09 ENCOUNTER — Ambulatory Visit (INDEPENDENT_AMBULATORY_CARE_PROVIDER_SITE_OTHER): Payer: Medicare Other | Admitting: Cardiology

## 2016-03-09 VITALS — BP 154/72 | HR 82 | Ht 64.0 in | Wt 150.0 lb

## 2016-03-09 DIAGNOSIS — D509 Iron deficiency anemia, unspecified: Secondary | ICD-10-CM | POA: Diagnosis not present

## 2016-03-09 DIAGNOSIS — I2510 Atherosclerotic heart disease of native coronary artery without angina pectoris: Secondary | ICD-10-CM | POA: Diagnosis not present

## 2016-03-09 DIAGNOSIS — I6523 Occlusion and stenosis of bilateral carotid arteries: Secondary | ICD-10-CM | POA: Diagnosis not present

## 2016-03-09 DIAGNOSIS — I10 Essential (primary) hypertension: Secondary | ICD-10-CM | POA: Diagnosis not present

## 2016-03-09 NOTE — Patient Instructions (Signed)
Medication Instructions:   Your physician recommends that you continue on your current medications as directed. Please refer to the Current Medication list given to you today.  Labwork:  NONE  Testing/Procedures:  NONE  Follow-Up:  Your physician recommends that you schedule a follow-up appointment in: 4 months.  Any Other Special Instructions Will Be Listed Below (If Applicable).  If you need a refill on your cardiac medications before your next appointment, please call your pharmacy. 

## 2016-03-09 NOTE — Progress Notes (Signed)
Cardiology Office Note  Date: 03/09/2016   ID: Lisa Kemp, DOB 1924-07-12, MRN [id number]  PCP: Glenda Chroman, MD  Primary Cardiologist: Rozann Lesches, MD   Chief Complaint  Patient presents with  . Coronary Artery Disease    History of Present Illness: Lisa Kemp is a 81 y.o. female last seen in April. She is here today for a follow-up visit. From a cardiac perspective she remained stable, no angina symptoms or increasing dyspnea. She has had some elevations in blood pressure intermittently, at times related to anxiety.  She has had a visit with Dr. Laural Golden, I reviewed the note from April. He reviewed her workup done at [facility] and continues to follow for iron deficiency anemia. She also reports problems with nausea and dysphagia.   I reviewed her medications. Cardiac regimen includes aspirin, atenolol, Lasix, potassium supplements, and as needed nitroglycerin.  Past Medical History  Diagnosis Date  . Mixed hyperlipidemia     Statin intolerant  . Gastrointestinal bleed     Related to NSAIDs  . Coronary atherosclerosis of native coronary artery     Nonobstructive at catherization 2006  . Hiatal hernia     Moderate sized sliding hiatal hernia  . Esophageal ring     Distal esophageal ring status post dilation  . Colon polyps   . GERD (gastroesophageal reflux disease)   . Arthritis   . Hypothyroidism   . Hypertension   . Intracranial hemorrhage (HCC)     Left subcortical 2015    Current Outpatient Prescriptions  Medication Sig Dispense Refill  . aspirin 81 MG tablet Take 81 mg by mouth daily.    [name] atenolol (TENORMIN) 25 MG tablet TAKE ONE TABLET BY MOUTH TWICE DAILY 180 tablet 3  . calcium-vitamin D (OSCAL WITH D) 500-200 MG-UNIT per tablet Take 1 tablet by mouth 2 (two) times daily.      . Coenzyme Q10 (COQ-10) 200 MG CAPS Take 1 capsule by mouth daily.      . furosemide (LASIX) 20 MG tablet Take 1 tablet (20 mg total) by mouth daily. 30 tablet 3    . gabapentin (NEURONTIN) 100 MG capsule Take 1 capsule (100 mg total) by mouth at bedtime. 30 capsule 1  . levothyroxine (SYNTHROID, LEVOTHROID) 112 MCG tablet Take 1 tablet (112 mcg total) by mouth daily. 30 tablet 1  . LORazepam (ATIVAN) 2 MG tablet Take 1 tablet (2 mg total) by mouth at bedtime. (Patient taking differently: Take 1-2 mg by mouth 2 (two) times daily. 1MG  IN THE MORNING AND 2MG  AT BEDTIME) 30 tablet 0  . Magnesium 250 MG TABS Take 1 tablet by mouth daily.      . nitroGLYCERIN (NITROSTAT) 0.4 MG SL tablet Place 1 tablet (0.4 mg total) under the tongue as directed. 25 tablet 3  . ondansetron (ZOFRAN-ODT) 4 MG disintegrating tablet DISSOLVE ONE TABLET IN MOUTH TWICE DAILY AS NEEDED FOR NAUSEA AND VOMITING 30 tablet 1  . Oyster Shell (OYSTER CALCIUM) 500 MG TABS tablet Take 500 mg of elemental calcium by mouth daily.    . potassium chloride (K-DUR) 10 MEQ tablet TAKE 1 tab daily with lasix 30 tablet 3  . ranitidine (ZANTAC) 150 MG tablet Take 150-300 mg by mouth 2 (two) times daily. 2 tablets in AM and 1 tablet in PM     No current facility-administered medications for this visit.   Allergies:  Diazepam and Statins   Social History: The patient  reports that she has never  smoked. She has never used smokeless tobacco. She reports that she does not drink alcohol or use illicit drugs.   ROS:  Please see the history of present illness. Otherwise, complete review of systems is positive for dysphagia, intermittent nausea, intermittent leg edema.  All other systems are reviewed and negative.   Physical Exam: VS:  BP 154/72 mmHg  Pulse 82  Ht 5\' 4"  (1.626 m)  Wt 150 lb (68.04 kg)  BMI 25.73 kg/m2  SpO2 95%, BMI Body mass index is 25.73 kg/(m^2).  Wt Readings from Last 3 Encounters:  03/09/16 150 lb (68.04 kg)  12/20/15 150 lb 14.4 oz (68.448 kg)  12/08/15 154 lb (69.854 kg)    Elderly woman, appears comfortable at rest.  HEENT: Conjunctiva and lids normal, oropharynx with  moist mucosa.  Neck: Supple, no bruits, no thyromegaly.  Lungs: Clear to auscultation, diminished, nonlabored.  Cardiac: Regular rate and rhythm, soft systolic murmur at the base, no S3 gallop.  Abdomen: Soft, nontender, bowel sounds present.  Skin: Warm and dry.  Extremities: 1+ leg edema distal pulses 1-2+.   ECG: I personally reviewed the tracing from 04/16/2015 which showed sinus rhythm with nonspecific ST changes.  Recent Labwork: 03/22/2015: BUN 25*; Creatinine, Ser 1.16*; Platelets 224; Potassium 4.1; Sodium 129* 02/14/2016: Hemoglobin 12.0   Other Studies Reviewed Today:  Echocardiogram 12/23/2013: Study Conclusions  - Left ventricle: The cavity size was normal. Wall thickness was normal. Systolic function was vigorous. The estimated ejection fraction was in the range of 65% to 70%. Wall motion was normal; there were no regional wall motion abnormalities. - Mitral valve: Moderate regurgitation directed eccentrically and posteriorly. - Left atrium: The atrium was mildly dilated. Impressions:  - Image quality limits evaluation of the mitral valve. Cannot exclude systolic anterior motion of the anterior leaflet as the underlying mechansm for MR.  Assessment and Plan:  1. History of nonobstructive CAD, no active angina at this time on medical therapy. LVEF 65-70%. Continue observation.  2. Essential hypertension, on atenolol at this point. We did discuss salt restriction, it sounds like she is watching this as well as her diet. May need an additional agent if blood pressure trend remains consistently elevated. Keep follow-up with Dr. Woody Seller.  3. Iron deficiency anemia, followed by Dr. Laural Golden.  Current medicines were reviewed with the patient today.  Disposition: Follow-up with me in 4 months.  Signed, Satira Sark, MD, [facility] 03/09/2016 1:20 PM    [facility] [address], [place], [place] [address] Phone: [phone]; Fax: [phone]

## 2016-04-10 ENCOUNTER — Ambulatory Visit (INDEPENDENT_AMBULATORY_CARE_PROVIDER_SITE_OTHER): Payer: Medicare Other | Admitting: Internal Medicine

## 2016-04-10 ENCOUNTER — Encounter (INDEPENDENT_AMBULATORY_CARE_PROVIDER_SITE_OTHER): Payer: Self-pay | Admitting: Internal Medicine

## 2016-04-10 VITALS — BP 130/70 | HR 76 | Temp 99.9°F | Resp 18 | Ht 63.0 in | Wt 150.1 lb

## 2016-04-10 DIAGNOSIS — R1310 Dysphagia, unspecified: Secondary | ICD-10-CM

## 2016-04-10 DIAGNOSIS — I6523 Occlusion and stenosis of bilateral carotid arteries: Secondary | ICD-10-CM | POA: Diagnosis not present

## 2016-04-10 DIAGNOSIS — R1319 Other dysphagia: Secondary | ICD-10-CM

## 2016-04-10 DIAGNOSIS — D509 Iron deficiency anemia, unspecified: Secondary | ICD-10-CM

## 2016-04-10 DIAGNOSIS — R1314 Dysphagia, pharyngoesophageal phase: Secondary | ICD-10-CM

## 2016-04-10 MED ORDER — ONDANSETRON 4 MG PO TBDP: ORAL_TABLET | ORAL | 1 refills | Status: DC

## 2016-04-10 NOTE — Patient Instructions (Signed)
Patient will call with results of barium study when completed. Next blood work would be in one month.

## 2016-04-10 NOTE — Progress Notes (Signed)
Presenting complaint;  Follow-up for iron deficiency anemia. Patient complains of solid food dysphagia.  Database and Subjective:  Patient is 82 year old Caucasian female who is here for scheduled visit accompanied by her daughter-in-law Kamy Antrim. Patient was last seen on 12/20/2015 because of iron deficiency anemia requiring 4 units of PRBCs. She underwent EGD and colonoscopy at [facility] and no bleeding lesion was identified. I felt patient possibly losing blood from her small bowel and she was advised to go back on by mouth iron. One out of 3 Hemoccults were positive.  Patient denies melena or rectal bleeding. She feels fine. She does not feel weak tied or dizzy. She has good appetite. She has occasional nausea with certain foods and needs ondansetron prescription refills. She also complains of dysphagia with solids. She states she's had difficulty swallowing ever since her EGD over 4 months ago. She points to suprasternal area site of bolus obstruction. Heartburn is well-controlled controlled with ranitidine.   Current Medications: Outpatient Encounter Prescriptions as of 04/10/2016  Medication Sig  . aspirin 81 MG tablet Take 81 mg by mouth daily.  [name] atenolol (TENORMIN) 25 MG tablet TAKE ONE TABLET BY MOUTH TWICE DAILY  . calcium-vitamin D (OSCAL WITH D) 500-200 MG-UNIT per tablet Take 1 tablet by mouth 2 (two) times daily.    . Coenzyme Q10 (COQ-10) 200 MG CAPS Take 1 capsule by mouth daily.    . furosemide (LASIX) 20 MG tablet Take 1 tablet (20 mg total) by mouth daily.  [name] gabapentin (NEURONTIN) 100 MG capsule Take 1 capsule (100 mg total) by mouth at bedtime.  [name] levothyroxine (SYNTHROID, LEVOTHROID) 112 MCG tablet Take 1 tablet (112 mcg total) by mouth daily.  [name] LORazepam (ATIVAN) 2 MG tablet Take 1 tablet (2 mg total) by mouth at bedtime. (Patient taking differently: Take 1-2 mg by mouth 2 (two) times daily. 1MG  IN THE MORNING AND 2MG  AT BEDTIME)  . Magnesium 250 MG TABS Take 1  tablet by mouth daily.    . nitroGLYCERIN (NITROSTAT) 0.4 MG SL tablet Place 1 tablet (0.4 mg total) under the tongue as directed.  . ondansetron (ZOFRAN-ODT) 4 MG disintegrating tablet DISSOLVE ONE TABLET IN MOUTH TWICE DAILY AS NEEDED FOR NAUSEA AND VOMITING  . Oyster Shell (OYSTER CALCIUM) 500 MG TABS tablet Take 500 mg of elemental calcium by mouth daily.  . potassium chloride (K-DUR) 10 MEQ tablet TAKE 1 tab daily with lasix  . ranitidine (ZANTAC) 150 MG tablet Take 150-300 mg by mouth 2 (two) times daily. 2 tablets in AM and 1 tablet in PM   No facility-administered encounter medications on file as of 04/10/2016.      Objective: Blood pressure 130/70, pulse 76, temperature 99.9 F (37.7 C), temperature source Oral, resp. rate 18, height 5\' 3"  (1.6 m), weight 150 lb 1.6 oz (68.1 kg). Patient is alert and in no acute distress. She required minimal assistance in moving from chair to examination table. Conjunctiva is pink. Sclera is nonicteric Oropharyngeal mucosa is normal. No neck masses or thyromegaly noted. Cardiac exam with regular rhythm normal S1 and S2. Faint systolic ejection murmur noted at left sternal border. Lungs are clear to auscultation. Abdomen is soft and nontender without organomegaly or masses. No LE edema or clubbing noted.  Labs/studies Results: H&H from 02/14/2016 was 12 and 37.2.    Assessment:  #1. Iron deficiency anemia most likely secondary to chronic/intermittent GI blood loss from small bowel. Anemia has fully corrected with by mouth iron. No indication for further  workup at this time. She should continue by mouth iron chronically. #2. Solid food dysphagia. She possibly has esophageal motility disorder but will rule out other structural abnormalities and determine whether dilation needed or indicated. #3. Sporadic nausea.   Plan:  Flintstones with iron chewable 2 tablets daily. Barium pill esophagogram. CBC and ferritin in one month. Office visit  in one year.

## 2016-04-17 DIAGNOSIS — I639 Cerebral infarction, unspecified: Secondary | ICD-10-CM | POA: Diagnosis not present

## 2016-04-17 DIAGNOSIS — I2510 Atherosclerotic heart disease of native coronary artery without angina pectoris: Secondary | ICD-10-CM | POA: Diagnosis not present

## 2016-04-17 DIAGNOSIS — I10 Essential (primary) hypertension: Secondary | ICD-10-CM | POA: Diagnosis not present

## 2016-04-18 DIAGNOSIS — R1310 Dysphagia, unspecified: Secondary | ICD-10-CM | POA: Diagnosis not present

## 2016-04-18 DIAGNOSIS — K449 Diaphragmatic hernia without obstruction or gangrene: Secondary | ICD-10-CM | POA: Diagnosis not present

## 2016-04-19 DIAGNOSIS — I2510 Atherosclerotic heart disease of native coronary artery without angina pectoris: Secondary | ICD-10-CM | POA: Diagnosis not present

## 2016-04-19 DIAGNOSIS — F411 Generalized anxiety disorder: Secondary | ICD-10-CM | POA: Diagnosis not present

## 2016-04-19 DIAGNOSIS — I639 Cerebral infarction, unspecified: Secondary | ICD-10-CM | POA: Diagnosis not present

## 2016-04-23 ENCOUNTER — Other Ambulatory Visit (INDEPENDENT_AMBULATORY_CARE_PROVIDER_SITE_OTHER): Payer: Self-pay | Admitting: *Deleted

## 2016-04-23 ENCOUNTER — Encounter (INDEPENDENT_AMBULATORY_CARE_PROVIDER_SITE_OTHER): Payer: Self-pay | Admitting: *Deleted

## 2016-04-23 DIAGNOSIS — D509 Iron deficiency anemia, unspecified: Secondary | ICD-10-CM

## 2016-04-25 ENCOUNTER — Telehealth (INDEPENDENT_AMBULATORY_CARE_PROVIDER_SITE_OTHER): Payer: Self-pay | Admitting: Internal Medicine

## 2016-04-25 NOTE — Telephone Encounter (Signed)
Patient and Daughter in law were called with the result. Per Dr.Rehman  - there may be a motility problem , there was no high grade stricture. We will watch for now.

## 2016-04-25 NOTE — Telephone Encounter (Signed)
Lisa Kemp called and stated that they missed Dr. Olevia Perches call regarding her Barium Swallow test and they are anxious to get the results.  [phone]

## 2016-04-29 DIAGNOSIS — R4182 Altered mental status, unspecified: Secondary | ICD-10-CM | POA: Diagnosis not present

## 2016-04-29 DIAGNOSIS — Z888 Allergy status to other drugs, medicaments and biological substances status: Secondary | ICD-10-CM | POA: Diagnosis not present

## 2016-04-29 DIAGNOSIS — R410 Disorientation, unspecified: Secondary | ICD-10-CM | POA: Diagnosis not present

## 2016-04-29 DIAGNOSIS — R030 Elevated blood-pressure reading, without diagnosis of hypertension: Secondary | ICD-10-CM | POA: Diagnosis not present

## 2016-04-29 DIAGNOSIS — Z809 Family history of malignant neoplasm, unspecified: Secondary | ICD-10-CM | POA: Diagnosis not present

## 2016-04-29 DIAGNOSIS — I10 Essential (primary) hypertension: Secondary | ICD-10-CM | POA: Diagnosis not present

## 2016-04-29 DIAGNOSIS — Z79899 Other long term (current) drug therapy: Secondary | ICD-10-CM | POA: Diagnosis not present

## 2016-04-29 DIAGNOSIS — Z8249 Family history of ischemic heart disease and other diseases of the circulatory system: Secondary | ICD-10-CM | POA: Diagnosis not present

## 2016-04-29 DIAGNOSIS — G629 Polyneuropathy, unspecified: Secondary | ICD-10-CM | POA: Diagnosis not present

## 2016-04-29 DIAGNOSIS — R930 Abnormal findings on diagnostic imaging of skull and head, not elsewhere classified: Secondary | ICD-10-CM | POA: Diagnosis not present

## 2016-04-29 DIAGNOSIS — I2510 Atherosclerotic heart disease of native coronary artery without angina pectoris: Secondary | ICD-10-CM | POA: Diagnosis present

## 2016-04-29 DIAGNOSIS — K219 Gastro-esophageal reflux disease without esophagitis: Secondary | ICD-10-CM | POA: Diagnosis present

## 2016-04-29 DIAGNOSIS — Z8673 Personal history of transient ischemic attack (TIA), and cerebral infarction without residual deficits: Secondary | ICD-10-CM | POA: Diagnosis not present

## 2016-04-29 DIAGNOSIS — I638 Other cerebral infarction: Secondary | ICD-10-CM | POA: Diagnosis not present

## 2016-04-29 DIAGNOSIS — M1990 Unspecified osteoarthritis, unspecified site: Secondary | ICD-10-CM | POA: Diagnosis present

## 2016-04-29 DIAGNOSIS — M810 Age-related osteoporosis without current pathological fracture: Secondary | ICD-10-CM | POA: Diagnosis present

## 2016-04-29 DIAGNOSIS — I639 Cerebral infarction, unspecified: Secondary | ICD-10-CM | POA: Diagnosis not present

## 2016-04-29 DIAGNOSIS — Z7982 Long term (current) use of aspirin: Secondary | ICD-10-CM | POA: Diagnosis not present

## 2016-04-29 DIAGNOSIS — E871 Hypo-osmolality and hyponatremia: Secondary | ICD-10-CM | POA: Diagnosis present

## 2016-05-02 ENCOUNTER — Encounter (INDEPENDENT_AMBULATORY_CARE_PROVIDER_SITE_OTHER): Payer: Self-pay

## 2016-05-07 DIAGNOSIS — I10 Essential (primary) hypertension: Secondary | ICD-10-CM | POA: Diagnosis not present

## 2016-05-07 DIAGNOSIS — D649 Anemia, unspecified: Secondary | ICD-10-CM | POA: Diagnosis not present

## 2016-05-07 DIAGNOSIS — I2510 Atherosclerotic heart disease of native coronary artery without angina pectoris: Secondary | ICD-10-CM | POA: Diagnosis not present

## 2016-05-07 DIAGNOSIS — Z7982 Long term (current) use of aspirin: Secondary | ICD-10-CM | POA: Diagnosis not present

## 2016-05-07 DIAGNOSIS — I69351 Hemiplegia and hemiparesis following cerebral infarction affecting right dominant side: Secondary | ICD-10-CM | POA: Diagnosis not present

## 2016-05-07 DIAGNOSIS — M810 Age-related osteoporosis without current pathological fracture: Secondary | ICD-10-CM | POA: Diagnosis not present

## 2016-05-08 DIAGNOSIS — I10 Essential (primary) hypertension: Secondary | ICD-10-CM | POA: Diagnosis not present

## 2016-05-08 DIAGNOSIS — Z7982 Long term (current) use of aspirin: Secondary | ICD-10-CM | POA: Diagnosis not present

## 2016-05-08 DIAGNOSIS — I69351 Hemiplegia and hemiparesis following cerebral infarction affecting right dominant side: Secondary | ICD-10-CM | POA: Diagnosis not present

## 2016-05-08 DIAGNOSIS — D649 Anemia, unspecified: Secondary | ICD-10-CM | POA: Diagnosis not present

## 2016-05-08 DIAGNOSIS — I2510 Atherosclerotic heart disease of native coronary artery without angina pectoris: Secondary | ICD-10-CM | POA: Diagnosis not present

## 2016-05-08 DIAGNOSIS — M810 Age-related osteoporosis without current pathological fracture: Secondary | ICD-10-CM | POA: Diagnosis not present

## 2016-05-10 DIAGNOSIS — G459 Transient cerebral ischemic attack, unspecified: Secondary | ICD-10-CM | POA: Diagnosis not present

## 2016-05-10 DIAGNOSIS — I69351 Hemiplegia and hemiparesis following cerebral infarction affecting right dominant side: Secondary | ICD-10-CM | POA: Diagnosis not present

## 2016-05-10 DIAGNOSIS — Z299 Encounter for prophylactic measures, unspecified: Secondary | ICD-10-CM | POA: Diagnosis not present

## 2016-05-10 DIAGNOSIS — D649 Anemia, unspecified: Secondary | ICD-10-CM | POA: Diagnosis not present

## 2016-05-10 DIAGNOSIS — Z7982 Long term (current) use of aspirin: Secondary | ICD-10-CM | POA: Diagnosis not present

## 2016-05-10 DIAGNOSIS — R410 Disorientation, unspecified: Secondary | ICD-10-CM | POA: Diagnosis not present

## 2016-05-10 DIAGNOSIS — M810 Age-related osteoporosis without current pathological fracture: Secondary | ICD-10-CM | POA: Diagnosis not present

## 2016-05-10 DIAGNOSIS — I10 Essential (primary) hypertension: Secondary | ICD-10-CM | POA: Diagnosis not present

## 2016-05-10 DIAGNOSIS — Z09 Encounter for follow-up examination after completed treatment for conditions other than malignant neoplasm: Secondary | ICD-10-CM | POA: Diagnosis not present

## 2016-05-10 DIAGNOSIS — I2510 Atherosclerotic heart disease of native coronary artery without angina pectoris: Secondary | ICD-10-CM | POA: Diagnosis not present

## 2016-05-11 DIAGNOSIS — I10 Essential (primary) hypertension: Secondary | ICD-10-CM | POA: Diagnosis not present

## 2016-05-11 DIAGNOSIS — M810 Age-related osteoporosis without current pathological fracture: Secondary | ICD-10-CM | POA: Diagnosis not present

## 2016-05-11 DIAGNOSIS — Z7982 Long term (current) use of aspirin: Secondary | ICD-10-CM | POA: Diagnosis not present

## 2016-05-11 DIAGNOSIS — I69351 Hemiplegia and hemiparesis following cerebral infarction affecting right dominant side: Secondary | ICD-10-CM | POA: Diagnosis not present

## 2016-05-11 DIAGNOSIS — I2510 Atherosclerotic heart disease of native coronary artery without angina pectoris: Secondary | ICD-10-CM | POA: Diagnosis not present

## 2016-05-11 DIAGNOSIS — D649 Anemia, unspecified: Secondary | ICD-10-CM | POA: Diagnosis not present

## 2016-05-14 DIAGNOSIS — M810 Age-related osteoporosis without current pathological fracture: Secondary | ICD-10-CM | POA: Diagnosis not present

## 2016-05-14 DIAGNOSIS — I69351 Hemiplegia and hemiparesis following cerebral infarction affecting right dominant side: Secondary | ICD-10-CM | POA: Diagnosis not present

## 2016-05-14 DIAGNOSIS — Z7982 Long term (current) use of aspirin: Secondary | ICD-10-CM | POA: Diagnosis not present

## 2016-05-14 DIAGNOSIS — D649 Anemia, unspecified: Secondary | ICD-10-CM | POA: Diagnosis not present

## 2016-05-14 DIAGNOSIS — I10 Essential (primary) hypertension: Secondary | ICD-10-CM | POA: Diagnosis not present

## 2016-05-14 DIAGNOSIS — I2510 Atherosclerotic heart disease of native coronary artery without angina pectoris: Secondary | ICD-10-CM | POA: Diagnosis not present

## 2016-05-15 DIAGNOSIS — I10 Essential (primary) hypertension: Secondary | ICD-10-CM | POA: Diagnosis not present

## 2016-05-15 DIAGNOSIS — I2510 Atherosclerotic heart disease of native coronary artery without angina pectoris: Secondary | ICD-10-CM | POA: Diagnosis not present

## 2016-05-15 DIAGNOSIS — Z7982 Long term (current) use of aspirin: Secondary | ICD-10-CM | POA: Diagnosis not present

## 2016-05-15 DIAGNOSIS — M810 Age-related osteoporosis without current pathological fracture: Secondary | ICD-10-CM | POA: Diagnosis not present

## 2016-05-15 DIAGNOSIS — D649 Anemia, unspecified: Secondary | ICD-10-CM | POA: Diagnosis not present

## 2016-05-15 DIAGNOSIS — I69351 Hemiplegia and hemiparesis following cerebral infarction affecting right dominant side: Secondary | ICD-10-CM | POA: Diagnosis not present

## 2016-05-16 DIAGNOSIS — I10 Essential (primary) hypertension: Secondary | ICD-10-CM | POA: Diagnosis not present

## 2016-05-16 DIAGNOSIS — D649 Anemia, unspecified: Secondary | ICD-10-CM | POA: Diagnosis not present

## 2016-05-16 DIAGNOSIS — I2510 Atherosclerotic heart disease of native coronary artery without angina pectoris: Secondary | ICD-10-CM | POA: Diagnosis not present

## 2016-05-16 DIAGNOSIS — I69351 Hemiplegia and hemiparesis following cerebral infarction affecting right dominant side: Secondary | ICD-10-CM | POA: Diagnosis not present

## 2016-05-16 DIAGNOSIS — M810 Age-related osteoporosis without current pathological fracture: Secondary | ICD-10-CM | POA: Diagnosis not present

## 2016-05-16 DIAGNOSIS — Z7982 Long term (current) use of aspirin: Secondary | ICD-10-CM | POA: Diagnosis not present

## 2016-05-17 DIAGNOSIS — I69351 Hemiplegia and hemiparesis following cerebral infarction affecting right dominant side: Secondary | ICD-10-CM | POA: Diagnosis not present

## 2016-05-17 DIAGNOSIS — M810 Age-related osteoporosis without current pathological fracture: Secondary | ICD-10-CM | POA: Diagnosis not present

## 2016-05-17 DIAGNOSIS — D649 Anemia, unspecified: Secondary | ICD-10-CM | POA: Diagnosis not present

## 2016-05-17 DIAGNOSIS — I10 Essential (primary) hypertension: Secondary | ICD-10-CM | POA: Diagnosis not present

## 2016-05-17 DIAGNOSIS — Z7982 Long term (current) use of aspirin: Secondary | ICD-10-CM | POA: Diagnosis not present

## 2016-05-17 DIAGNOSIS — I2510 Atherosclerotic heart disease of native coronary artery without angina pectoris: Secondary | ICD-10-CM | POA: Diagnosis not present

## 2016-05-21 DIAGNOSIS — I10 Essential (primary) hypertension: Secondary | ICD-10-CM | POA: Diagnosis not present

## 2016-05-21 DIAGNOSIS — I69351 Hemiplegia and hemiparesis following cerebral infarction affecting right dominant side: Secondary | ICD-10-CM | POA: Diagnosis not present

## 2016-05-21 DIAGNOSIS — M810 Age-related osteoporosis without current pathological fracture: Secondary | ICD-10-CM | POA: Diagnosis not present

## 2016-05-21 DIAGNOSIS — Z7982 Long term (current) use of aspirin: Secondary | ICD-10-CM | POA: Diagnosis not present

## 2016-05-21 DIAGNOSIS — I2510 Atherosclerotic heart disease of native coronary artery without angina pectoris: Secondary | ICD-10-CM | POA: Diagnosis not present

## 2016-05-21 DIAGNOSIS — D649 Anemia, unspecified: Secondary | ICD-10-CM | POA: Diagnosis not present

## 2016-05-23 DIAGNOSIS — Z7982 Long term (current) use of aspirin: Secondary | ICD-10-CM | POA: Diagnosis not present

## 2016-05-23 DIAGNOSIS — I10 Essential (primary) hypertension: Secondary | ICD-10-CM | POA: Diagnosis not present

## 2016-05-23 DIAGNOSIS — I69351 Hemiplegia and hemiparesis following cerebral infarction affecting right dominant side: Secondary | ICD-10-CM | POA: Diagnosis not present

## 2016-05-23 DIAGNOSIS — I2510 Atherosclerotic heart disease of native coronary artery without angina pectoris: Secondary | ICD-10-CM | POA: Diagnosis not present

## 2016-05-23 DIAGNOSIS — M810 Age-related osteoporosis without current pathological fracture: Secondary | ICD-10-CM | POA: Diagnosis not present

## 2016-05-23 DIAGNOSIS — D649 Anemia, unspecified: Secondary | ICD-10-CM | POA: Diagnosis not present

## 2016-05-28 DIAGNOSIS — M170 Bilateral primary osteoarthritis of knee: Secondary | ICD-10-CM | POA: Diagnosis not present

## 2016-05-30 DIAGNOSIS — D649 Anemia, unspecified: Secondary | ICD-10-CM | POA: Diagnosis not present

## 2016-05-30 DIAGNOSIS — I69351 Hemiplegia and hemiparesis following cerebral infarction affecting right dominant side: Secondary | ICD-10-CM | POA: Diagnosis not present

## 2016-05-30 DIAGNOSIS — M810 Age-related osteoporosis without current pathological fracture: Secondary | ICD-10-CM | POA: Diagnosis not present

## 2016-05-30 DIAGNOSIS — I10 Essential (primary) hypertension: Secondary | ICD-10-CM | POA: Diagnosis not present

## 2016-05-30 DIAGNOSIS — Z7982 Long term (current) use of aspirin: Secondary | ICD-10-CM | POA: Diagnosis not present

## 2016-05-30 DIAGNOSIS — I2510 Atherosclerotic heart disease of native coronary artery without angina pectoris: Secondary | ICD-10-CM | POA: Diagnosis not present

## 2016-06-07 DIAGNOSIS — K219 Gastro-esophageal reflux disease without esophagitis: Secondary | ICD-10-CM | POA: Diagnosis not present

## 2016-06-07 DIAGNOSIS — Z7982 Long term (current) use of aspirin: Secondary | ICD-10-CM | POA: Diagnosis not present

## 2016-06-07 DIAGNOSIS — I69351 Hemiplegia and hemiparesis following cerebral infarction affecting right dominant side: Secondary | ICD-10-CM | POA: Diagnosis not present

## 2016-06-07 DIAGNOSIS — G4700 Insomnia, unspecified: Secondary | ICD-10-CM | POA: Diagnosis not present

## 2016-06-07 DIAGNOSIS — I2510 Atherosclerotic heart disease of native coronary artery without angina pectoris: Secondary | ICD-10-CM | POA: Diagnosis not present

## 2016-06-07 DIAGNOSIS — D649 Anemia, unspecified: Secondary | ICD-10-CM | POA: Diagnosis not present

## 2016-06-07 DIAGNOSIS — M810 Age-related osteoporosis without current pathological fracture: Secondary | ICD-10-CM | POA: Diagnosis not present

## 2016-06-07 DIAGNOSIS — I10 Essential (primary) hypertension: Secondary | ICD-10-CM | POA: Diagnosis not present

## 2016-06-07 DIAGNOSIS — D500 Iron deficiency anemia secondary to blood loss (chronic): Secondary | ICD-10-CM | POA: Diagnosis not present

## 2016-06-07 DIAGNOSIS — F411 Generalized anxiety disorder: Secondary | ICD-10-CM | POA: Diagnosis not present

## 2016-06-13 DIAGNOSIS — I69351 Hemiplegia and hemiparesis following cerebral infarction affecting right dominant side: Secondary | ICD-10-CM | POA: Diagnosis not present

## 2016-06-13 DIAGNOSIS — Z6827 Body mass index (BMI) 27.0-27.9, adult: Secondary | ICD-10-CM | POA: Diagnosis not present

## 2016-06-13 DIAGNOSIS — Z7982 Long term (current) use of aspirin: Secondary | ICD-10-CM | POA: Diagnosis not present

## 2016-06-13 DIAGNOSIS — I2510 Atherosclerotic heart disease of native coronary artery without angina pectoris: Secondary | ICD-10-CM | POA: Diagnosis not present

## 2016-06-13 DIAGNOSIS — R21 Rash and other nonspecific skin eruption: Secondary | ICD-10-CM | POA: Diagnosis not present

## 2016-06-13 DIAGNOSIS — I10 Essential (primary) hypertension: Secondary | ICD-10-CM | POA: Diagnosis not present

## 2016-06-13 DIAGNOSIS — Z713 Dietary counseling and surveillance: Secondary | ICD-10-CM | POA: Diagnosis not present

## 2016-06-13 DIAGNOSIS — Z299 Encounter for prophylactic measures, unspecified: Secondary | ICD-10-CM | POA: Diagnosis not present

## 2016-06-13 DIAGNOSIS — I639 Cerebral infarction, unspecified: Secondary | ICD-10-CM | POA: Diagnosis not present

## 2016-06-13 DIAGNOSIS — D649 Anemia, unspecified: Secondary | ICD-10-CM | POA: Diagnosis not present

## 2016-06-13 DIAGNOSIS — M810 Age-related osteoporosis without current pathological fracture: Secondary | ICD-10-CM | POA: Diagnosis not present

## 2016-06-19 ENCOUNTER — Other Ambulatory Visit: Payer: Self-pay | Admitting: *Deleted

## 2016-06-19 MED ORDER — NITROGLYCERIN 0.4 MG SL SUBL: 0.4000 mg | SUBLINGUAL_TABLET | SUBLINGUAL | 3 refills | Status: DC

## 2016-06-20 DIAGNOSIS — I69351 Hemiplegia and hemiparesis following cerebral infarction affecting right dominant side: Secondary | ICD-10-CM | POA: Diagnosis not present

## 2016-06-20 DIAGNOSIS — D649 Anemia, unspecified: Secondary | ICD-10-CM | POA: Diagnosis not present

## 2016-06-20 DIAGNOSIS — M810 Age-related osteoporosis without current pathological fracture: Secondary | ICD-10-CM | POA: Diagnosis not present

## 2016-06-20 DIAGNOSIS — Z7982 Long term (current) use of aspirin: Secondary | ICD-10-CM | POA: Diagnosis not present

## 2016-06-20 DIAGNOSIS — I10 Essential (primary) hypertension: Secondary | ICD-10-CM | POA: Diagnosis not present

## 2016-06-20 DIAGNOSIS — I2510 Atherosclerotic heart disease of native coronary artery without angina pectoris: Secondary | ICD-10-CM | POA: Diagnosis not present

## 2016-07-03 DIAGNOSIS — M810 Age-related osteoporosis without current pathological fracture: Secondary | ICD-10-CM | POA: Diagnosis not present

## 2016-07-03 DIAGNOSIS — I10 Essential (primary) hypertension: Secondary | ICD-10-CM | POA: Diagnosis not present

## 2016-07-03 DIAGNOSIS — I69351 Hemiplegia and hemiparesis following cerebral infarction affecting right dominant side: Secondary | ICD-10-CM | POA: Diagnosis not present

## 2016-07-03 DIAGNOSIS — D649 Anemia, unspecified: Secondary | ICD-10-CM | POA: Diagnosis not present

## 2016-07-03 DIAGNOSIS — I2510 Atherosclerotic heart disease of native coronary artery without angina pectoris: Secondary | ICD-10-CM | POA: Diagnosis not present

## 2016-07-03 DIAGNOSIS — Z7982 Long term (current) use of aspirin: Secondary | ICD-10-CM | POA: Diagnosis not present

## 2016-07-26 ENCOUNTER — Other Ambulatory Visit (INDEPENDENT_AMBULATORY_CARE_PROVIDER_SITE_OTHER): Payer: Self-pay | Admitting: *Deleted

## 2016-07-26 ENCOUNTER — Telehealth (INDEPENDENT_AMBULATORY_CARE_PROVIDER_SITE_OTHER): Payer: Self-pay | Admitting: Internal Medicine

## 2016-07-26 MED ORDER — ONDANSETRON 4 MG PO TBDP: ORAL_TABLET | ORAL | 1 refills | Status: DC

## 2016-07-26 NOTE — Telephone Encounter (Signed)
Addressed with Dr.Rehman. He approved Ondansetron 4 mg PO BID prn N&V #30 1 refill. This was E-Scribed to patient's pharmacy. Patient was made aware.

## 2016-07-26 NOTE — Telephone Encounter (Signed)
Willette Cluster called for the patient.  She stated that the patient is out of her Ondansetron 4mg  and would like a refill.  Willette Cluster [phone]

## 2016-07-26 NOTE — Telephone Encounter (Signed)
A Rx was E-Scribed to the patient's pharmacy , per Dr.Rehman's approval. Patient's friend was made aware.

## 2016-08-06 NOTE — Progress Notes (Signed)
Cardiology Office Note  Date: 08/07/2016   ID: Lisa Kemp, DOB 07-Aug-1924, MRN [id number]  PCP: Glenda Chroman, MD  Primary Cardiologist: Rozann Lesches, MD   Chief Complaint  Patient presents with  . Coronary Artery Disease    History of Present Illness: Lisa Kemp is a 82 y.o. female last seen in July. She is here today with her daughter for a follow-up visit. She does not report any angina symptoms. Functionally limited by knee pain, uses a walker, denies any falls.  We reviewed her medications which are outlined below. Cardiac regimen includes aspirin, atenolol, Lasix, potassium supplements, and as needed nitroglycerin which she has not used.  I reviewed her ECG today which shows sinus rhythm with nonspecific T-wave changes.  Past Medical History:  Diagnosis Date  . Arthritis   . Colon polyps   . Coronary atherosclerosis of native coronary artery    Nonobstructive at catherization 2006  . Esophageal ring    Distal esophageal ring status post dilation  . Gastrointestinal bleed    Related to NSAIDs  . GERD (gastroesophageal reflux disease)   . Hiatal hernia    Moderate sized sliding hiatal hernia  . Hypertension   . Hypothyroidism   . Intracranial hemorrhage ([place])    Left subcortical 2015  . Mixed hyperlipidemia    Statin intolerant    Current Outpatient Prescriptions  Medication Sig Dispense Refill  . aspirin 81 MG tablet Take 81 mg by mouth daily.    [name] atenolol (TENORMIN) 25 MG tablet TAKE ONE TABLET BY MOUTH TWICE DAILY 180 tablet 3  . calcium-vitamin D (OSCAL WITH D) 500-200 MG-UNIT per tablet Take 1 tablet by mouth 2 (two) times daily.      . Cholecalciferol (VITAMIN D3) 2000 units TABS Take 1 tablet by mouth daily.    . Coenzyme Q10 (COQ-10) 200 MG CAPS Take 1 capsule by mouth daily.      . furosemide (LASIX) 20 MG tablet Take 1 tablet (20 mg total) by mouth daily. 30 tablet 3  . gabapentin (NEURONTIN) 100 MG capsule Take 1 capsule (100  mg total) by mouth at bedtime. 30 capsule 1  . levothyroxine (SYNTHROID, LEVOTHROID) 112 MCG tablet Take 1 tablet (112 mcg total) by mouth daily. 30 tablet 1  . LORazepam (ATIVAN) 2 MG tablet Take 1 tablet (2 mg total) by mouth at bedtime. (Patient taking differently: Take 1-2 mg by mouth 2 (two) times daily. 1MG  IN THE MORNING AND 2MG  AT BEDTIME) 30 tablet 0  . Magnesium 250 MG TABS Take 1 tablet by mouth daily.      . nitroGLYCERIN (NITROSTAT) 0.4 MG SL tablet Place 1 tablet (0.4 mg total) under the tongue as directed. 25 tablet 3  . ondansetron (ZOFRAN-ODT) 4 MG disintegrating tablet DISSOLVE ONE TABLET IN MOUTH TWICE DAILY AS NEEDED FOR NAUSEA AND VOMITING 30 tablet 1  . Oyster Shell (OYSTER CALCIUM) 500 MG TABS tablet Take 500 mg of elemental calcium by mouth daily.    . potassium chloride (K-DUR) 10 MEQ tablet TAKE 1 tab daily with lasix 30 tablet 3  . ranitidine (ZANTAC) 150 MG tablet Take 150-300 mg by mouth 2 (two) times daily. 2 tablets in AM and 1 tablet in PM    . vitamin B-12 (CYANOCOBALAMIN) 500 MCG tablet Take 500 mcg by mouth daily.     No current facility-administered medications for this visit.    Allergies:  Diazepam and Statins   Social History: The patient  reports  that she has never smoked. She has never used smokeless tobacco. She reports that she does not drink alcohol or use drugs.   ROS:  Please see the history of present illness. Otherwise, complete review of systems is positive for hearing loss.  All other systems are reviewed and negative.   Physical Exam: VS:  BP (!) 160/75   Pulse 76   Ht 5\' 4"  (1.626 m)   Wt 146 lb 9.6 oz (66.5 kg)   SpO2 94%   BMI 25.16 kg/m , BMI Body mass index is 25.16 kg/m.  Wt Readings from Last 3 Encounters:  08/07/16 146 lb 9.6 oz (66.5 kg)  04/10/16 150 lb 1.6 oz (68.1 kg)  03/09/16 150 lb (68 kg)    Elderly woman, appears comfortable at rest.  HEENT: Conjunctiva and lids normal, oropharynx with moist mucosa.  Neck:  Supple, no bruits, no thyromegaly.  Lungs: Clear to auscultation, diminished, nonlabored.  Cardiac: Regular rate and rhythm, soft systolic murmur at the base, no S3 gallop.  Abdomen: Soft, nontender, bowel sounds present.  Skin: Warm and dry.  Extremities: 1+ leg edema distal pulses 1-2+.   ECG: I personally reviewed the tracing from 04/16/2015 which showed sinus rhythm with nonspecific ST changes.  Recent Labwork: 02/14/2016: Hemoglobin 12.0     Component Value Date/Time   CHOL 157 12/23/2013 0228   TRIG 188 (H) 12/23/2013 0228   HDL 35 (L) 12/23/2013 0228   CHOLHDL 4.5 12/23/2013 0228   VLDL 38 12/23/2013 0228   LDLCALC 84 12/23/2013 0228    Other Studies Reviewed Today:  Carotid Dopplers 10/13/2015: Stable 1-39% bilateral ICA stenoses.  Echocardiogram 12/23/2013: Study Conclusions  - Left ventricle: The cavity size was normal. Wall thickness was normal. Systolic function was vigorous. The estimated ejection fraction was in the range of 65% to 70%. Wall motion was normal; there were no regional wall motion abnormalities. - Mitral valve: Moderate regurgitation directed eccentrically and posteriorly. - Left atrium: The atrium was mildly dilated. Impressions:  - Image quality limits evaluation of the mitral valve. Cannot exclude systolic anterior motion of the anterior leaflet as the underlying mechansm for MR.  Assessment and Plan:  1. Nonobstructive CAD with no active angina symptoms. Plan is to continue medical therapy and observation. ECG reviewed and stable.  2. Essential hypertension, blood pressure is elevated today. Remains on atenolol. Keep follow-up with Dr. Woody Seller.  Current medicines were reviewed with the patient today.   Orders Placed This Encounter  Procedures  . EKG 12-Lead    Disposition: Follow-up in 4 months.  Signed, Satira Sark, MD, [facility] 08/07/2016 2:24 PM    [facility] [place], [place], [place] [address] Phone: [phone]; Fax: [phone]

## 2016-08-07 ENCOUNTER — Encounter: Payer: Self-pay | Admitting: Cardiology

## 2016-08-07 ENCOUNTER — Ambulatory Visit (INDEPENDENT_AMBULATORY_CARE_PROVIDER_SITE_OTHER): Payer: Medicare Other | Admitting: Cardiology

## 2016-08-07 VITALS — BP 160/75 | HR 76 | Ht 64.0 in | Wt 146.6 lb

## 2016-08-07 DIAGNOSIS — I6523 Occlusion and stenosis of bilateral carotid arteries: Secondary | ICD-10-CM | POA: Diagnosis not present

## 2016-08-07 DIAGNOSIS — I10 Essential (primary) hypertension: Secondary | ICD-10-CM

## 2016-08-07 DIAGNOSIS — I2510 Atherosclerotic heart disease of native coronary artery without angina pectoris: Secondary | ICD-10-CM

## 2016-08-07 NOTE — Patient Instructions (Signed)
Your physician wants you to follow-up in: 4 MONTHS WITH DR. Domenic Polite You will receive a reminder letter in the mail two months in advance. If you don't receive a letter, please call our office to schedule the follow-up appointment.  Your physician recommends that you continue on your current medications as directed. Please refer to the Current Medication list given to you today.  Thank you for choosing [facility]!!

## 2016-08-08 DIAGNOSIS — Z713 Dietary counseling and surveillance: Secondary | ICD-10-CM | POA: Diagnosis not present

## 2016-08-08 DIAGNOSIS — I639 Cerebral infarction, unspecified: Secondary | ICD-10-CM | POA: Diagnosis not present

## 2016-08-08 DIAGNOSIS — Z6827 Body mass index (BMI) 27.0-27.9, adult: Secondary | ICD-10-CM | POA: Diagnosis not present

## 2016-08-08 DIAGNOSIS — Z299 Encounter for prophylactic measures, unspecified: Secondary | ICD-10-CM | POA: Diagnosis not present

## 2016-08-08 DIAGNOSIS — G4700 Insomnia, unspecified: Secondary | ICD-10-CM | POA: Diagnosis not present

## 2016-08-13 ENCOUNTER — Other Ambulatory Visit (INDEPENDENT_AMBULATORY_CARE_PROVIDER_SITE_OTHER): Payer: Self-pay | Admitting: Internal Medicine

## 2016-08-13 ENCOUNTER — Other Ambulatory Visit: Payer: Self-pay | Admitting: Cardiology

## 2016-08-13 DIAGNOSIS — M170 Bilateral primary osteoarthritis of knee: Secondary | ICD-10-CM | POA: Diagnosis not present

## 2016-08-28 DIAGNOSIS — M170 Bilateral primary osteoarthritis of knee: Secondary | ICD-10-CM | POA: Diagnosis not present

## 2016-09-03 DIAGNOSIS — M170 Bilateral primary osteoarthritis of knee: Secondary | ICD-10-CM | POA: Diagnosis not present

## 2016-09-07 DIAGNOSIS — Z713 Dietary counseling and surveillance: Secondary | ICD-10-CM | POA: Diagnosis not present

## 2016-09-07 DIAGNOSIS — Z6827 Body mass index (BMI) 27.0-27.9, adult: Secondary | ICD-10-CM | POA: Diagnosis not present

## 2016-09-07 DIAGNOSIS — J069 Acute upper respiratory infection, unspecified: Secondary | ICD-10-CM | POA: Diagnosis not present

## 2016-09-07 DIAGNOSIS — I639 Cerebral infarction, unspecified: Secondary | ICD-10-CM | POA: Diagnosis not present

## 2016-09-07 DIAGNOSIS — I10 Essential (primary) hypertension: Secondary | ICD-10-CM | POA: Diagnosis not present

## 2016-09-07 DIAGNOSIS — I2510 Atherosclerotic heart disease of native coronary artery without angina pectoris: Secondary | ICD-10-CM | POA: Diagnosis not present

## 2016-09-07 DIAGNOSIS — Z299 Encounter for prophylactic measures, unspecified: Secondary | ICD-10-CM | POA: Diagnosis not present

## 2016-10-01 ENCOUNTER — Telehealth (INDEPENDENT_AMBULATORY_CARE_PROVIDER_SITE_OTHER): Payer: Self-pay | Admitting: Internal Medicine

## 2016-10-01 NOTE — Telephone Encounter (Signed)
Butch Penny w/[facility] called, stated that the patient is complaining of a lot of nausea.  She'd like to see if Dr. Laural Golden will call something in for the patient.  [phone]

## 2016-10-02 NOTE — Telephone Encounter (Signed)
Per Dr.Rehman - may call in Zofran 4 mg - take 1 by mouth twice a day prn nausea #20 no refills. This was called to [phone] Care)/ [facility]. Butch Penny @ [facility] was made aware.

## 2016-10-22 DIAGNOSIS — M79674 Pain in right toe(s): Secondary | ICD-10-CM | POA: Diagnosis not present

## 2016-10-22 DIAGNOSIS — B351 Tinea unguium: Secondary | ICD-10-CM | POA: Diagnosis not present

## 2016-10-22 DIAGNOSIS — M79675 Pain in left toe(s): Secondary | ICD-10-CM | POA: Diagnosis not present

## 2016-11-15 DIAGNOSIS — I2510 Atherosclerotic heart disease of native coronary artery without angina pectoris: Secondary | ICD-10-CM | POA: Diagnosis not present

## 2016-11-15 DIAGNOSIS — Z713 Dietary counseling and surveillance: Secondary | ICD-10-CM | POA: Diagnosis not present

## 2016-11-15 DIAGNOSIS — K219 Gastro-esophageal reflux disease without esophagitis: Secondary | ICD-10-CM | POA: Diagnosis not present

## 2016-11-15 DIAGNOSIS — E039 Hypothyroidism, unspecified: Secondary | ICD-10-CM | POA: Diagnosis not present

## 2016-11-15 DIAGNOSIS — Z6828 Body mass index (BMI) 28.0-28.9, adult: Secondary | ICD-10-CM | POA: Diagnosis not present

## 2016-11-15 DIAGNOSIS — I10 Essential (primary) hypertension: Secondary | ICD-10-CM | POA: Diagnosis not present

## 2016-11-15 DIAGNOSIS — Z299 Encounter for prophylactic measures, unspecified: Secondary | ICD-10-CM | POA: Diagnosis not present

## 2016-11-15 DIAGNOSIS — I639 Cerebral infarction, unspecified: Secondary | ICD-10-CM | POA: Diagnosis not present

## 2016-12-13 ENCOUNTER — Other Ambulatory Visit (INDEPENDENT_AMBULATORY_CARE_PROVIDER_SITE_OTHER): Payer: Self-pay | Admitting: Internal Medicine

## 2016-12-26 ENCOUNTER — Other Ambulatory Visit (INDEPENDENT_AMBULATORY_CARE_PROVIDER_SITE_OTHER): Payer: Self-pay | Admitting: Internal Medicine

## 2016-12-28 DIAGNOSIS — H353131 Nonexudative age-related macular degeneration, bilateral, early dry stage: Secondary | ICD-10-CM | POA: Diagnosis not present

## 2017-01-28 DIAGNOSIS — Z1389 Encounter for screening for other disorder: Secondary | ICD-10-CM | POA: Diagnosis not present

## 2017-01-28 DIAGNOSIS — K219 Gastro-esophageal reflux disease without esophagitis: Secondary | ICD-10-CM | POA: Diagnosis not present

## 2017-01-28 DIAGNOSIS — E039 Hypothyroidism, unspecified: Secondary | ICD-10-CM | POA: Diagnosis not present

## 2017-01-28 DIAGNOSIS — Z6828 Body mass index (BMI) 28.0-28.9, adult: Secondary | ICD-10-CM | POA: Diagnosis not present

## 2017-01-28 DIAGNOSIS — I639 Cerebral infarction, unspecified: Secondary | ICD-10-CM | POA: Diagnosis not present

## 2017-01-28 DIAGNOSIS — Z299 Encounter for prophylactic measures, unspecified: Secondary | ICD-10-CM | POA: Diagnosis not present

## 2017-01-28 DIAGNOSIS — Z79899 Other long term (current) drug therapy: Secondary | ICD-10-CM | POA: Diagnosis not present

## 2017-01-28 DIAGNOSIS — I10 Essential (primary) hypertension: Secondary | ICD-10-CM | POA: Diagnosis not present

## 2017-01-28 DIAGNOSIS — R5383 Other fatigue: Secondary | ICD-10-CM | POA: Diagnosis not present

## 2017-01-28 DIAGNOSIS — Z7189 Other specified counseling: Secondary | ICD-10-CM | POA: Diagnosis not present

## 2017-01-28 DIAGNOSIS — Z0000 Encounter for general adult medical examination without abnormal findings: Secondary | ICD-10-CM | POA: Diagnosis not present

## 2017-01-28 DIAGNOSIS — I2510 Atherosclerotic heart disease of native coronary artery without angina pectoris: Secondary | ICD-10-CM | POA: Diagnosis not present

## 2017-02-21 ENCOUNTER — Encounter (INDEPENDENT_AMBULATORY_CARE_PROVIDER_SITE_OTHER): Payer: Self-pay | Admitting: Internal Medicine

## 2017-03-04 DIAGNOSIS — M79674 Pain in right toe(s): Secondary | ICD-10-CM | POA: Diagnosis not present

## 2017-03-04 DIAGNOSIS — B351 Tinea unguium: Secondary | ICD-10-CM | POA: Diagnosis not present

## 2017-03-04 DIAGNOSIS — M79675 Pain in left toe(s): Secondary | ICD-10-CM | POA: Diagnosis not present

## 2017-05-02 DIAGNOSIS — Z299 Encounter for prophylactic measures, unspecified: Secondary | ICD-10-CM | POA: Diagnosis not present

## 2017-05-02 DIAGNOSIS — I639 Cerebral infarction, unspecified: Secondary | ICD-10-CM | POA: Diagnosis not present

## 2017-05-02 DIAGNOSIS — Z6829 Body mass index (BMI) 29.0-29.9, adult: Secondary | ICD-10-CM | POA: Diagnosis not present

## 2017-05-02 DIAGNOSIS — E039 Hypothyroidism, unspecified: Secondary | ICD-10-CM | POA: Diagnosis not present

## 2017-05-02 DIAGNOSIS — Z713 Dietary counseling and surveillance: Secondary | ICD-10-CM | POA: Diagnosis not present

## 2017-05-02 DIAGNOSIS — F411 Generalized anxiety disorder: Secondary | ICD-10-CM | POA: Diagnosis not present

## 2017-05-02 DIAGNOSIS — I2510 Atherosclerotic heart disease of native coronary artery without angina pectoris: Secondary | ICD-10-CM | POA: Diagnosis not present

## 2017-05-02 DIAGNOSIS — I10 Essential (primary) hypertension: Secondary | ICD-10-CM | POA: Diagnosis not present

## 2017-05-02 DIAGNOSIS — M1710 Unilateral primary osteoarthritis, unspecified knee: Secondary | ICD-10-CM | POA: Diagnosis not present

## 2017-05-20 DIAGNOSIS — M79675 Pain in left toe(s): Secondary | ICD-10-CM | POA: Diagnosis not present

## 2017-05-20 DIAGNOSIS — B351 Tinea unguium: Secondary | ICD-10-CM | POA: Diagnosis not present

## 2017-05-20 DIAGNOSIS — M79674 Pain in right toe(s): Secondary | ICD-10-CM | POA: Diagnosis not present

## 2017-06-08 DIAGNOSIS — I5022 Chronic systolic (congestive) heart failure: Secondary | ICD-10-CM | POA: Diagnosis not present

## 2017-06-08 DIAGNOSIS — I69351 Hemiplegia and hemiparesis following cerebral infarction affecting right dominant side: Secondary | ICD-10-CM | POA: Diagnosis not present

## 2017-06-08 DIAGNOSIS — I110 Hypertensive heart disease with heart failure: Secondary | ICD-10-CM | POA: Diagnosis not present

## 2017-06-08 DIAGNOSIS — G609 Hereditary and idiopathic neuropathy, unspecified: Secondary | ICD-10-CM | POA: Diagnosis not present

## 2017-06-08 DIAGNOSIS — F0390 Unspecified dementia without behavioral disturbance: Secondary | ICD-10-CM | POA: Diagnosis not present

## 2017-06-08 DIAGNOSIS — I2510 Atherosclerotic heart disease of native coronary artery without angina pectoris: Secondary | ICD-10-CM | POA: Diagnosis not present

## 2017-06-12 DIAGNOSIS — I2510 Atherosclerotic heart disease of native coronary artery without angina pectoris: Secondary | ICD-10-CM | POA: Diagnosis not present

## 2017-06-12 DIAGNOSIS — I5022 Chronic systolic (congestive) heart failure: Secondary | ICD-10-CM | POA: Diagnosis not present

## 2017-06-12 DIAGNOSIS — G609 Hereditary and idiopathic neuropathy, unspecified: Secondary | ICD-10-CM | POA: Diagnosis not present

## 2017-06-12 DIAGNOSIS — F0390 Unspecified dementia without behavioral disturbance: Secondary | ICD-10-CM | POA: Diagnosis not present

## 2017-06-12 DIAGNOSIS — I69351 Hemiplegia and hemiparesis following cerebral infarction affecting right dominant side: Secondary | ICD-10-CM | POA: Diagnosis not present

## 2017-06-12 DIAGNOSIS — I110 Hypertensive heart disease with heart failure: Secondary | ICD-10-CM | POA: Diagnosis not present

## 2017-06-13 DIAGNOSIS — I69351 Hemiplegia and hemiparesis following cerebral infarction affecting right dominant side: Secondary | ICD-10-CM | POA: Diagnosis not present

## 2017-06-13 DIAGNOSIS — G609 Hereditary and idiopathic neuropathy, unspecified: Secondary | ICD-10-CM | POA: Diagnosis not present

## 2017-06-13 DIAGNOSIS — I5022 Chronic systolic (congestive) heart failure: Secondary | ICD-10-CM | POA: Diagnosis not present

## 2017-06-13 DIAGNOSIS — I110 Hypertensive heart disease with heart failure: Secondary | ICD-10-CM | POA: Diagnosis not present

## 2017-06-13 DIAGNOSIS — F0390 Unspecified dementia without behavioral disturbance: Secondary | ICD-10-CM | POA: Diagnosis not present

## 2017-06-13 DIAGNOSIS — I2510 Atherosclerotic heart disease of native coronary artery without angina pectoris: Secondary | ICD-10-CM | POA: Diagnosis not present

## 2017-06-18 DIAGNOSIS — I69351 Hemiplegia and hemiparesis following cerebral infarction affecting right dominant side: Secondary | ICD-10-CM | POA: Diagnosis not present

## 2017-06-18 DIAGNOSIS — G609 Hereditary and idiopathic neuropathy, unspecified: Secondary | ICD-10-CM | POA: Diagnosis not present

## 2017-06-18 DIAGNOSIS — I110 Hypertensive heart disease with heart failure: Secondary | ICD-10-CM | POA: Diagnosis not present

## 2017-06-18 DIAGNOSIS — I5022 Chronic systolic (congestive) heart failure: Secondary | ICD-10-CM | POA: Diagnosis not present

## 2017-06-18 DIAGNOSIS — I2510 Atherosclerotic heart disease of native coronary artery without angina pectoris: Secondary | ICD-10-CM | POA: Diagnosis not present

## 2017-06-18 DIAGNOSIS — F0390 Unspecified dementia without behavioral disturbance: Secondary | ICD-10-CM | POA: Diagnosis not present

## 2017-06-20 DIAGNOSIS — I110 Hypertensive heart disease with heart failure: Secondary | ICD-10-CM | POA: Diagnosis not present

## 2017-06-20 DIAGNOSIS — F0390 Unspecified dementia without behavioral disturbance: Secondary | ICD-10-CM | POA: Diagnosis not present

## 2017-06-20 DIAGNOSIS — I2510 Atherosclerotic heart disease of native coronary artery without angina pectoris: Secondary | ICD-10-CM | POA: Diagnosis not present

## 2017-06-20 DIAGNOSIS — I5022 Chronic systolic (congestive) heart failure: Secondary | ICD-10-CM | POA: Diagnosis not present

## 2017-06-20 DIAGNOSIS — I69351 Hemiplegia and hemiparesis following cerebral infarction affecting right dominant side: Secondary | ICD-10-CM | POA: Diagnosis not present

## 2017-06-20 DIAGNOSIS — G609 Hereditary and idiopathic neuropathy, unspecified: Secondary | ICD-10-CM | POA: Diagnosis not present

## 2017-06-21 DIAGNOSIS — Z23 Encounter for immunization: Secondary | ICD-10-CM | POA: Diagnosis not present

## 2017-06-25 DIAGNOSIS — I2510 Atherosclerotic heart disease of native coronary artery without angina pectoris: Secondary | ICD-10-CM | POA: Diagnosis not present

## 2017-06-25 DIAGNOSIS — I110 Hypertensive heart disease with heart failure: Secondary | ICD-10-CM | POA: Diagnosis not present

## 2017-06-25 DIAGNOSIS — I69351 Hemiplegia and hemiparesis following cerebral infarction affecting right dominant side: Secondary | ICD-10-CM | POA: Diagnosis not present

## 2017-06-25 DIAGNOSIS — F0390 Unspecified dementia without behavioral disturbance: Secondary | ICD-10-CM | POA: Diagnosis not present

## 2017-06-25 DIAGNOSIS — I5022 Chronic systolic (congestive) heart failure: Secondary | ICD-10-CM | POA: Diagnosis not present

## 2017-06-25 DIAGNOSIS — G609 Hereditary and idiopathic neuropathy, unspecified: Secondary | ICD-10-CM | POA: Diagnosis not present

## 2017-06-27 DIAGNOSIS — I110 Hypertensive heart disease with heart failure: Secondary | ICD-10-CM | POA: Diagnosis not present

## 2017-06-27 DIAGNOSIS — I5022 Chronic systolic (congestive) heart failure: Secondary | ICD-10-CM | POA: Diagnosis not present

## 2017-06-27 DIAGNOSIS — I2510 Atherosclerotic heart disease of native coronary artery without angina pectoris: Secondary | ICD-10-CM | POA: Diagnosis not present

## 2017-06-27 DIAGNOSIS — F0390 Unspecified dementia without behavioral disturbance: Secondary | ICD-10-CM | POA: Diagnosis not present

## 2017-06-27 DIAGNOSIS — G609 Hereditary and idiopathic neuropathy, unspecified: Secondary | ICD-10-CM | POA: Diagnosis not present

## 2017-06-27 DIAGNOSIS — I69351 Hemiplegia and hemiparesis following cerebral infarction affecting right dominant side: Secondary | ICD-10-CM | POA: Diagnosis not present

## 2017-07-01 DIAGNOSIS — I110 Hypertensive heart disease with heart failure: Secondary | ICD-10-CM | POA: Diagnosis not present

## 2017-07-01 DIAGNOSIS — I5022 Chronic systolic (congestive) heart failure: Secondary | ICD-10-CM | POA: Diagnosis not present

## 2017-07-01 DIAGNOSIS — G609 Hereditary and idiopathic neuropathy, unspecified: Secondary | ICD-10-CM | POA: Diagnosis not present

## 2017-07-01 DIAGNOSIS — F0390 Unspecified dementia without behavioral disturbance: Secondary | ICD-10-CM | POA: Diagnosis not present

## 2017-07-01 DIAGNOSIS — I2510 Atherosclerotic heart disease of native coronary artery without angina pectoris: Secondary | ICD-10-CM | POA: Diagnosis not present

## 2017-07-01 DIAGNOSIS — I69351 Hemiplegia and hemiparesis following cerebral infarction affecting right dominant side: Secondary | ICD-10-CM | POA: Diagnosis not present

## 2017-07-03 DIAGNOSIS — I69351 Hemiplegia and hemiparesis following cerebral infarction affecting right dominant side: Secondary | ICD-10-CM | POA: Diagnosis not present

## 2017-07-03 DIAGNOSIS — G609 Hereditary and idiopathic neuropathy, unspecified: Secondary | ICD-10-CM | POA: Diagnosis not present

## 2017-07-03 DIAGNOSIS — F0390 Unspecified dementia without behavioral disturbance: Secondary | ICD-10-CM | POA: Diagnosis not present

## 2017-07-03 DIAGNOSIS — I5022 Chronic systolic (congestive) heart failure: Secondary | ICD-10-CM | POA: Diagnosis not present

## 2017-07-03 DIAGNOSIS — I2510 Atherosclerotic heart disease of native coronary artery without angina pectoris: Secondary | ICD-10-CM | POA: Diagnosis not present

## 2017-07-03 DIAGNOSIS — I110 Hypertensive heart disease with heart failure: Secondary | ICD-10-CM | POA: Diagnosis not present

## 2017-07-08 DIAGNOSIS — F0390 Unspecified dementia without behavioral disturbance: Secondary | ICD-10-CM | POA: Diagnosis not present

## 2017-07-08 DIAGNOSIS — G609 Hereditary and idiopathic neuropathy, unspecified: Secondary | ICD-10-CM | POA: Diagnosis not present

## 2017-07-08 DIAGNOSIS — I2510 Atherosclerotic heart disease of native coronary artery without angina pectoris: Secondary | ICD-10-CM | POA: Diagnosis not present

## 2017-07-08 DIAGNOSIS — I5022 Chronic systolic (congestive) heart failure: Secondary | ICD-10-CM | POA: Diagnosis not present

## 2017-07-08 DIAGNOSIS — I110 Hypertensive heart disease with heart failure: Secondary | ICD-10-CM | POA: Diagnosis not present

## 2017-07-08 DIAGNOSIS — I69351 Hemiplegia and hemiparesis following cerebral infarction affecting right dominant side: Secondary | ICD-10-CM | POA: Diagnosis not present

## 2017-07-11 DIAGNOSIS — I110 Hypertensive heart disease with heart failure: Secondary | ICD-10-CM | POA: Diagnosis not present

## 2017-07-11 DIAGNOSIS — I5022 Chronic systolic (congestive) heart failure: Secondary | ICD-10-CM | POA: Diagnosis not present

## 2017-07-11 DIAGNOSIS — G609 Hereditary and idiopathic neuropathy, unspecified: Secondary | ICD-10-CM | POA: Diagnosis not present

## 2017-07-11 DIAGNOSIS — I2510 Atherosclerotic heart disease of native coronary artery without angina pectoris: Secondary | ICD-10-CM | POA: Diagnosis not present

## 2017-07-11 DIAGNOSIS — F0390 Unspecified dementia without behavioral disturbance: Secondary | ICD-10-CM | POA: Diagnosis not present

## 2017-07-11 DIAGNOSIS — I69351 Hemiplegia and hemiparesis following cerebral infarction affecting right dominant side: Secondary | ICD-10-CM | POA: Diagnosis not present

## 2017-07-11 DIAGNOSIS — H353131 Nonexudative age-related macular degeneration, bilateral, early dry stage: Secondary | ICD-10-CM | POA: Diagnosis not present

## 2017-07-29 DIAGNOSIS — M79675 Pain in left toe(s): Secondary | ICD-10-CM | POA: Diagnosis not present

## 2017-07-29 DIAGNOSIS — M79674 Pain in right toe(s): Secondary | ICD-10-CM | POA: Diagnosis not present

## 2017-07-29 DIAGNOSIS — B351 Tinea unguium: Secondary | ICD-10-CM | POA: Diagnosis not present

## 2017-10-15 DIAGNOSIS — M79675 Pain in left toe(s): Secondary | ICD-10-CM | POA: Diagnosis not present

## 2017-10-15 DIAGNOSIS — B351 Tinea unguium: Secondary | ICD-10-CM | POA: Diagnosis not present

## 2017-11-17 ENCOUNTER — Encounter (HOSPITAL_COMMUNITY): Payer: Self-pay

## 2017-11-17 ENCOUNTER — Emergency Department (HOSPITAL_COMMUNITY)
Admission: EM | Admit: 2017-11-17 | Discharge: 2017-11-17 | Disposition: A | Discharge status: Home / Self Care | Payer: Medicare HMO | Attending: Internal Medicine | Admitting: Internal Medicine

## 2017-11-17 ENCOUNTER — Emergency Department (HOSPITAL_COMMUNITY): Payer: Medicare HMO

## 2017-11-17 DIAGNOSIS — I509 Heart failure, unspecified: Secondary | ICD-10-CM | POA: Diagnosis not present

## 2017-11-17 DIAGNOSIS — E039 Hypothyroidism, unspecified: Secondary | ICD-10-CM | POA: Diagnosis not present

## 2017-11-17 DIAGNOSIS — Z79899 Other long term (current) drug therapy: Secondary | ICD-10-CM | POA: Insufficient documentation

## 2017-11-17 DIAGNOSIS — I110 Hypertensive heart disease with heart failure: Secondary | ICD-10-CM | POA: Insufficient documentation

## 2017-11-17 DIAGNOSIS — J440 Chronic obstructive pulmonary disease with acute lower respiratory infection: Secondary | ICD-10-CM

## 2017-11-17 DIAGNOSIS — R079 Chest pain, unspecified: Secondary | ICD-10-CM | POA: Diagnosis not present

## 2017-11-17 DIAGNOSIS — Z7982 Long term (current) use of aspirin: Secondary | ICD-10-CM | POA: Insufficient documentation

## 2017-11-17 DIAGNOSIS — J40 Bronchitis, not specified as acute or chronic: Secondary | ICD-10-CM | POA: Diagnosis not present

## 2017-11-17 DIAGNOSIS — J209 Acute bronchitis, unspecified: Secondary | ICD-10-CM

## 2017-11-17 DIAGNOSIS — R05 Cough: Secondary | ICD-10-CM | POA: Diagnosis present

## 2017-11-17 DIAGNOSIS — R059 Cough, unspecified: Secondary | ICD-10-CM

## 2017-11-17 DIAGNOSIS — I2510 Atherosclerotic heart disease of native coronary artery without angina pectoris: Secondary | ICD-10-CM | POA: Insufficient documentation

## 2017-11-17 DIAGNOSIS — F0390 Unspecified dementia without behavioral disturbance: Secondary | ICD-10-CM | POA: Insufficient documentation

## 2017-11-17 HISTORY — DX: Heart failure, unspecified: I50.9

## 2017-11-17 HISTORY — DX: Unspecified dementia, unspecified severity, without behavioral disturbance, psychotic disturbance, mood disturbance, and anxiety: F03.90

## 2017-11-17 HISTORY — DX: Polyneuropathy, unspecified: G62.9

## 2017-11-17 LAB — TROPONIN I: TROPONIN I: 0.04 ng/mL — AB (ref <0.03)

## 2017-11-17 LAB — CBC WITH DIFFERENTIAL/PLATELET
BASOS ABS: 0 10*3/uL (ref 0.0–0.1)
Basophils Relative: 0 %
EOS ABS: 0.3 10*3/uL (ref 0.0–0.7)
Eosinophils Relative: 3 %
HCT: 36.6 % (ref 36.0–46.0)
HEMOGLOBIN: 11.7 g/dL — AB (ref 12.0–15.0)
LYMPHS ABS: 1.3 10*3/uL (ref 0.7–4.0)
Lymphocytes Relative: 15 %
MCH: 30.3 pg (ref 26.0–34.0)
MCHC: 32 g/dL (ref 30.0–36.0)
MCV: 94.8 fL (ref 78.0–100.0)
Monocytes Absolute: 0.9 10*3/uL (ref 0.1–1.0)
Monocytes Relative: 10 %
NEUTROS PCT: 72 %
Neutro Abs: 6.5 10*3/uL (ref 1.7–7.7)
Platelets: 187 10*3/uL (ref 150–400)
RBC: 3.86 MIL/uL — AB (ref 3.87–5.11)
RDW: 13.9 % (ref 11.5–15.5)
WBC: 9 10*3/uL (ref 4.0–10.5)

## 2017-11-17 LAB — BASIC METABOLIC PANEL
ANION GAP: 12 (ref 5–15)
BUN: 25 mg/dL — ABNORMAL HIGH (ref 6–20)
CO2: 27 mmol/L (ref 22–32)
Calcium: 9.1 mg/dL (ref 8.9–10.3)
Chloride: 98 mmol/L — ABNORMAL LOW (ref 101–111)
Creatinine, Ser: 1.14 mg/dL — ABNORMAL HIGH (ref 0.44–1.00)
GFR calc Af Amer: 47 mL/min — ABNORMAL LOW (ref >60)
GFR, EST NON AFRICAN AMERICAN: 41 mL/min — AB (ref >60)
GLUCOSE: 110 mg/dL — AB (ref 65–99)
POTASSIUM: 4.3 mmol/L (ref 3.5–5.1)
Sodium: 137 mmol/L (ref 135–145)

## 2017-11-17 LAB — LACTIC ACID, PLASMA
LACTIC ACID, VENOUS: 0.8 mmol/L (ref 0.5–1.9)
LACTIC ACID, VENOUS: 1.3 mmol/L (ref 0.5–1.9)

## 2017-11-17 LAB — INFLUENZA PANEL BY PCR (TYPE A & B)
INFLAPCR: NEGATIVE
INFLBPCR: NEGATIVE

## 2017-11-17 LAB — BRAIN NATRIURETIC PEPTIDE: B Natriuretic Peptide: 142 pg/mL — ABNORMAL HIGH (ref 0.0–100.0)

## 2017-11-17 MED ORDER — ASPIRIN 81 MG PO CHEW
324.0000 mg | CHEWABLE_TABLET | Freq: Once | ORAL | Status: AC
  Administered 2017-11-17: 324 mg via ORAL
  Filled 2017-11-17: qty 4

## 2017-11-17 MED ORDER — IPRATROPIUM-ALBUTEROL 0.5-2.5 (3) MG/3ML IN SOLN
3.0000 mL | Freq: Once | RESPIRATORY_TRACT | Status: AC
  Administered 2017-11-17: 3 mL via RESPIRATORY_TRACT
  Filled 2017-11-17: qty 3

## 2017-11-17 MED ORDER — NITROGLYCERIN 0.4 MG SL SUBL: 0.4000 mg | SUBLINGUAL_TABLET | SUBLINGUAL | Status: DC | PRN

## 2017-11-17 NOTE — ED Notes (Signed)
Date and time results received: 11/17/17 6:06 PM  (use smartphrase ".now" to insert current time)  Test: Troponin Critical Value: 0.04  Name of Provider Notified: Thurnell Garbe  Orders Received? Or Actions Taken?: Orders Received - See Orders for details

## 2017-11-17 NOTE — ED Provider Notes (Signed)
Pt signed out by Dr. Thurnell Garbe pending consult by Dr. Manuella Ghazi.  The pt was determined to be stable for d/c by Dr. Manuella Ghazi as she likely has viral URI.  Pt's slight elevation in troponin likely due to CRI.  Return if worse.  F/u with pcp.   Isla Pence, MD 11/17/17 2215

## 2017-11-17 NOTE — ED Provider Notes (Signed)
[facility] ([facility]) EMERGENCY DEPARTMENT Provider Note   CSN: [id number] Arrival date & time: 11/17/17  1621     History   Chief Complaint Chief Complaint  Patient presents with  . Cough    HPI Lisa Kemp is a 84 y.o. female.  The history is provided by the patient and the nursing home. The history is limited by the condition of the patient (Hx dementia).  Cough   Pt was seen at 1655. Per pt: c/o gradual onset and persistence of constant "cough" since yesterday. Pt states since she arrived to the ED, she has developed chest "tightness." Has been associated with runny/stuffy nose. Denies fevers, no rash, no back pain, no palpitations, no SOB, no abd pain, no N/V/D.   Past Medical History:  Diagnosis Date  . Arthritis   . CHF (congestive heart failure) ([place])   . Colon polyps   . Coronary atherosclerosis of native coronary artery    Nonobstructive at catherization 2006  . Dementia   . Esophageal ring    Distal esophageal ring status post dilation  . Gastrointestinal bleed    Related to NSAIDs  . GERD (gastroesophageal reflux disease)   . Hiatal hernia    Moderate sized sliding hiatal hernia  . Hypertension   . Hypothyroidism   . Intracranial hemorrhage ([place])    Left subcortical 2015  . Mixed hyperlipidemia    Statin intolerant  . Neuropathy     Patient Active Problem List   Diagnosis Date Noted  . Carotid atherosclerosis 06/11/2014  . Osteoarthritis of left knee 02/17/2014  . SDH (subdural hematoma) ([place]) 12/25/2013  . Intracerebral hemorrhage ([place]) 12/22/2013  . Essential hypertension, benign 08/01/2009  . Mixed hyperlipidemia 06/12/2009  . CAD, NATIVE VESSEL 06/12/2009    Past Surgical History:  Procedure Laterality Date  . APPENDECTOMY    . CATARACT EXTRACTION    . COLON SURGERY    . TOTAL ABDOMINAL HYSTERECTOMY    . TOTAL ABDOMINAL HYSTERECTOMY W/ BILATERAL SALPINGOOPHORECTOMY       OB History   None      Home Medications    Prior to  Admission medications   Medication Sig Start Date End Date Taking? Authorizing Provider  aspirin 81 MG tablet Take 81 mg by mouth daily.    [provider]  atenolol (TENORMIN) 25 MG tablet TAKE ONE TABLET BY MOUTH TWICE DAILY 03/08/16   Satira Sark, MD  calcium-vitamin D (OSCAL WITH D) 500-200 MG-UNIT per tablet Take 1 tablet by mouth 2 (two) times daily.      [provider]  Cholecalciferol (VITAMIN D3) 2000 units TABS Take 1 tablet by mouth daily.    [provider]  Coenzyme Q10 (COQ-10) 200 MG CAPS Take 1 capsule by mouth daily.      [provider]  furosemide (LASIX) 20 MG tablet TAKE ONE TABLET BY MOUTH DAILY 08/13/16   Satira Sark, MD  gabapentin (NEURONTIN) 100 MG capsule Take 1 capsule (100 mg total) by mouth at bedtime. 01/01/14   Angiulli, Lavon Paganini, PA-C  levothyroxine (SYNTHROID, LEVOTHROID) 112 MCG tablet Take 1 tablet (112 mcg total) by mouth daily. 01/01/14   Angiulli, Lavon Paganini, PA-C  LORazepam (ATIVAN) 2 MG tablet Take 1 tablet (2 mg total) by mouth at bedtime. Patient taking differently: Take 1-2 mg by mouth 2 (two) times daily. 1MG  IN THE MORNING AND 2MG  AT BEDTIME 01/01/14   Angiulli, Lavon Paganini, PA-C  Magnesium 250 MG TABS Take 1 tablet by mouth  daily.      [provider]  nitroGLYCERIN (NITROSTAT) 0.4 MG SL tablet Place 1 tablet (0.4 mg total) under the tongue as directed. 06/19/16   Satira Sark, MD  ondansetron (ZOFRAN) 4 MG tablet TAKE 1 TABLET BY MOUTH TWICE DAILY AS NEEDED FOR NAUSEA. 12/27/16   Rehman, Mechele Dawley, MD  ondansetron (ZOFRAN-ODT) 4 MG disintegrating tablet DISSOLVE ONE TABLET BY MOUTH TWICE DAILY AS NEEDED FOR NAUSEA AND FOR VOMITING 08/13/16   Setzer, Rona Ravens, NP  Oyster Shell (OYSTER CALCIUM) 500 MG TABS tablet Take 500 mg of elemental calcium by mouth daily.    [provider]  potassium chloride (K-DUR) 10 MEQ tablet TAKE 1 tab daily with lasix 10/31/15   Satira Sark, MD  ranitidine  (ZANTAC) 150 MG tablet Take 150-300 mg by mouth 2 (two) times daily. 2 tablets in AM and 1 tablet in PM 01/01/14   Angiulli, Lavon Paganini, PA-C  vitamin B-12 (CYANOCOBALAMIN) 500 MCG tablet Take 500 mcg by mouth daily.    [provider]    Family History Family History  Problem Relation Age of Onset  . Heart attack Mother   . Heart attack Father   . Hypertension Unknown     Social History Social History   Tobacco Use  . Smoking status: Never Smoker  . Smokeless tobacco: Never Used  Substance Use Topics  . Alcohol use: No    Alcohol/week: 0.0 oz    Comment: 06/11/14 -- "Never tasted a beer, wine, or a whiskey.." - AJ  . Drug use: No     Allergies   Diazepam and Statins   Review of Systems Review of Systems  Unable to perform ROS: Dementia  Respiratory: Positive for cough.      Physical Exam Updated Vital Signs BP (!) 163/70 (BP Location: Left Arm)   Pulse 86   Temp 99.6 F (37.6 C) (Oral)   Resp (!) 22   Ht 5\' 4"  (1.626 m)   Wt 66.7 kg (147 lb)   SpO2 92%   BMI 25.23 kg/m   Physical Exam 1700: Physical examination:  Nursing notes reviewed; Vital signs and O2 SAT reviewed;  Constitutional: Well developed, Well nourished, Well hydrated, In no acute distress; Head:  Normocephalic, atraumatic; Eyes: EOMI, PERRL, No scleral icterus; ENMT: Mouth and pharynx normal, Mucous membranes moist; Neck: Supple, Full range of motion, No lymphadenopathy; Cardiovascular: Regular rate and rhythm, No gallop; Respiratory: Breath sounds coarse & equal bilaterally, No wheezes. +moist cough during exam. Speaking full sentences with ease, Normal respiratory effort/excursion; Chest: Nontender, Movement normal; Abdomen: Soft, Nontender, Nondistended, Normal bowel sounds; Genitourinary: No CVA tenderness; Extremities: Peripheral pulses normal, No tenderness, +1 pedal edema bilat. No calf edema or asymmetry, +mild erythema to bilat anterior tibial areas..; Neuro: Awake, alert, confused per  hx dementia. Major CN grossly intact. No facial droop. Speech clear. No gross focal motor deficits in extremities.; Skin: Color normal, Warm, Dry.   ED Treatments / Results  Labs (all labs ordered are listed, but only abnormal results are displayed)   EKG EKG Interpretation  Date/Time:  Sunday November 17 2017 17:34:19 EDT Ventricular Rate:  88 PR Interval:    QRS Duration: 100 QT Interval:  366 QTC Calculation: 443 R Axis:   63 Text Interpretation:  Sinus rhythm Borderline low voltage, extremity leads Baseline wander Artifact When compared with ECG of 03/22/2015 No significant change was found Confirmed by [name] [phone]) on 11/17/2017 5:44:26 PM   Radiology  Procedures Procedures (including critical care time)  Medications Ordered in ED Medications  aspirin chewable tablet 324 mg (has no administration in time range)  nitroGLYCERIN (NITROSTAT) SL tablet 0.4 mg (has no administration in time range)  ipratropium-albuterol (DUONEB) 0.5-2.5 (3) MG/3ML nebulizer solution 3 mL (has no administration in time range)     Initial Impression / Assessment and Plan / ED Course  I have reviewed the triage vital signs and the nursing notes.  Pertinent labs & imaging results that were available during my care of the patient were reviewed by me and considered in my medical decision making (see chart for details).  MDM Reviewed: previous chart, nursing note and vitals Reviewed previous: labs and ECG Interpretation: labs, ECG and x-ray   Results for orders placed or performed during the hospital encounter of 38/18/29  Basic metabolic panel  Result Value Ref Range   Sodium 137 135 - 145 mmol/L   Potassium 4.3 3.5 - 5.1 mmol/L   Chloride 98 (L) 101 - 111 mmol/L   CO2 27 22 - 32 mmol/L   Glucose, Bld 110 (H) 65 - 99 mg/dL   BUN 25 (H) 6 - 20 mg/dL   Creatinine, Ser 1.14 (H) 0.44 - 1.00 mg/dL   Calcium 9.1 8.9 - 10.3 mg/dL   GFR calc non Af Amer 41 (L) >60 mL/min   GFR calc  Af Amer 47 (L) >60 mL/min   Anion gap 12 5 - 15  Brain natriuretic peptide  Result Value Ref Range   B Natriuretic Peptide 142.0 (H) 0.0 - 100.0 pg/mL  Troponin I  Result Value Ref Range   Troponin I 0.04 (HH) <0.03 ng/mL  CBC with Differential  Result Value Ref Range   WBC 9.0 4.0 - 10.5 K/uL   RBC 3.86 (L) 3.87 - 5.11 MIL/uL   Hemoglobin 11.7 (L) 12.0 - 15.0 g/dL   HCT 36.6 36.0 - 46.0 %   MCV 94.8 78.0 - 100.0 fL   MCH 30.3 26.0 - 34.0 pg   MCHC 32.0 30.0 - 36.0 g/dL   RDW 13.9 11.5 - 15.5 %   Platelets 187 150 - 400 K/uL   Neutrophils Relative % 72 %   Neutro Abs 6.5 1.7 - 7.7 K/uL   Lymphocytes Relative 15 %   Lymphs Abs 1.3 0.7 - 4.0 K/uL   Monocytes Relative 10 %   Monocytes Absolute 0.9 0.1 - 1.0 K/uL   Eosinophils Relative 3 %   Eosinophils Absolute 0.3 0.0 - 0.7 K/uL   Basophils Relative 0 %   Basophils Absolute 0.0 0.0 - 0.1 K/uL  Lactic acid, plasma  Result Value Ref Range   Lactic Acid, Venous 0.8 0.5 - 1.9 mmol/L  Lactic acid, plasma  Result Value Ref Range   Lactic Acid, Venous 1.3 0.5 - 1.9 mmol/L  Influenza panel by PCR (type A & B)  Result Value Ref Range   Influenza A By PCR NEGATIVE NEGATIVE   Influenza B By PCR NEGATIVE NEGATIVE   Dg Chest 2 View Result Date: 11/17/2017 CLINICAL DATA:  Nursing home patient with coughing for 1 day. Runny nose. EXAM: CHEST - 2 VIEW COMPARISON:  Radiographs 01/01/2014. FINDINGS: Stable mild cardiomegaly and aortic atherosclerosis. Possible small hiatal hernia. The lungs are clear. There is no pleural effusion or pneumothorax. No acute osseous findings are demonstrated. There are glenohumeral degenerative changes bilaterally with evidence chronic bilateral rotator cuff tears. There is a thoracolumbar scoliosis. IMPRESSION: No acute cardiopulmonary process. Cardiomegaly and aortic atherosclerosis. Electronically  Signed   By: Richardean Sale M.D.   On: 11/17/2017 17:36    2010:  CXR reassuring. Short neb and ASA given. Pt  refused SL ntg. States her CP has improved. Pt with significant hx of dementia; unclear regarding CP hx today, as pt continues to focus in on her cough. Troponin mildly elevated; Heart score 4. Will observation admit to cycle troponin. T/C returned from [facility] Dr. Manuella Ghazi, case discussed, including:  HPI, pertinent PM/SHx, VS/PE, dx testing, ED course and treatment:  Agreeable to come to ED for evaluation.     Final Clinical Impressions(s) / ED Diagnoses   Final diagnoses:  None    ED Discharge Orders    None       [name], DO 11/19/17 1551

## 2017-11-17 NOTE — ED Triage Notes (Signed)
Pt is a pt from [facility]. Pt reports she has been coughing since yesterday. Also reports runny nose

## 2017-11-17 NOTE — Consult Note (Signed)
Consultation   Lisa Kemp DOB: 1924/01/15 DOA: 11/17/2017  PCP: Glenda Chroman, MD   Patient coming from: [facility]  Chief Complaint: Cough and rhinorrhea  HPI: Lisa Kemp is a 84 y.o. female with medical history significant for dementia, CHF, hypothyroidism, hypertension, and neuropathy who presented to the emergency department from her skilled nursing facility on account of a cough over the last 2 days along with a runny nose.  She actually denies any shortness of breath or chest pain and denies any fevers or chills.  She is actually a fairly reliable historian despite her dementia and her sister is currently at the bedside facilitating with a history taking and she appears to be quite reliable as well.  There is no lower extremity edema or dyspnea on exertion.   ED Course: Vital signs have remained stable and she is actually saturating in the 90th percentile on room air.  She currently denies any symptomatology and initially received a breathing treatment and a full dose aspirin in the ED, but declined nitroglycerin.  Her EKG is unremarkable with a sinus rhythm at 88 bpm and troponin was noted to be 0.04.  BNP is 142.  Flu swab is negative.  Lactic acid levels are within normal limits.  No other significant lab abnormalities are noted.  The patient would actually like to go back to her facility and does not want to be admitted if there is no need.  Review of Systems: All others reviewed and otherwise negative.  Past Medical History:  Diagnosis Date  . Arthritis   . CHF (congestive heart failure) ([place])   . Colon polyps   . Coronary atherosclerosis of native coronary artery    Nonobstructive at catherization 2006  . Dementia   . Esophageal ring    Distal esophageal ring status post dilation  . Gastrointestinal bleed    Related to NSAIDs  . GERD (gastroesophageal reflux disease)   . Hiatal hernia    Moderate sized sliding hiatal hernia  .  Hypertension   . Hypothyroidism   . Intracranial hemorrhage ([place])    Left subcortical 2015  . Mixed hyperlipidemia    Statin intolerant  . Neuropathy     Past Surgical History:  Procedure Laterality Date  . APPENDECTOMY    . CATARACT EXTRACTION    . COLON SURGERY    . TOTAL ABDOMINAL HYSTERECTOMY    . TOTAL ABDOMINAL HYSTERECTOMY W/ BILATERAL SALPINGOOPHORECTOMY       reports that she has never smoked. She has never used smokeless tobacco. She reports that she does not drink alcohol or use drugs.  Allergies  Allergen Reactions  . Diazepam Other (See Comments)    Could not walk  . Statins Other (See Comments)    CRAMPING    Family History  Problem Relation Age of Onset  . Heart attack Mother   . Heart attack Father   . Hypertension Unknown     Prior to Admission medications   Medication Sig Start Date End Date Taking? Authorizing Provider  aspirin 81 MG tablet Take 81 mg by mouth daily.    [provider]  atenolol (TENORMIN) 25 MG tablet TAKE ONE TABLET BY MOUTH TWICE DAILY 03/08/16   Satira Sark, MD  calcium-vitamin D (OSCAL WITH D) 500-200 MG-UNIT per tablet Take 1 tablet by mouth 2 (two) times daily.      [provider]  Cholecalciferol (VITAMIN D3) 2000 units TABS Take 1 tablet by mouth daily.  [provider]  Coenzyme Q10 (COQ-10) 200 MG CAPS Take 1 capsule by mouth daily.      [provider]  furosemide (LASIX) 20 MG tablet TAKE ONE TABLET BY MOUTH DAILY 08/13/16   Satira Sark, MD  gabapentin (NEURONTIN) 100 MG capsule Take 1 capsule (100 mg total) by mouth at bedtime. 01/01/14   Angiulli, Lavon Paganini, PA-C  levothyroxine (SYNTHROID, LEVOTHROID) 112 MCG tablet Take 1 tablet (112 mcg total) by mouth daily. 01/01/14   Angiulli, Lavon Paganini, PA-C  LORazepam (ATIVAN) 2 MG tablet Take 1 tablet (2 mg total) by mouth at bedtime. Patient taking differently: Take 1-2 mg by mouth 2 (two) times daily. 1MG  IN THE MORNING AND 2MG  AT  BEDTIME 01/01/14   Angiulli, Lavon Paganini, PA-C  Magnesium 250 MG TABS Take 1 tablet by mouth daily.      [provider]  nitroGLYCERIN (NITROSTAT) 0.4 MG SL tablet Place 1 tablet (0.4 mg total) under the tongue as directed. 06/19/16   Satira Sark, MD  ondansetron (ZOFRAN) 4 MG tablet TAKE 1 TABLET BY MOUTH TWICE DAILY AS NEEDED FOR NAUSEA. 12/27/16   Rehman, Mechele Dawley, MD  ondansetron (ZOFRAN-ODT) 4 MG disintegrating tablet DISSOLVE ONE TABLET BY MOUTH TWICE DAILY AS NEEDED FOR NAUSEA AND FOR VOMITING 08/13/16   Setzer, Rona Ravens, NP  Oyster Shell (OYSTER CALCIUM) 500 MG TABS tablet Take 500 mg of elemental calcium by mouth daily.    [provider]  potassium chloride (K-DUR) 10 MEQ tablet TAKE 1 tab daily with lasix 10/31/15   Satira Sark, MD  ranitidine (ZANTAC) 150 MG tablet Take 150-300 mg by mouth 2 (two) times daily. 2 tablets in AM and 1 tablet in PM 01/01/14   Angiulli, Lavon Paganini, PA-C  vitamin B-12 (CYANOCOBALAMIN) 500 MCG tablet Take 500 mcg by mouth daily.    [provider]    Physical Exam: Vitals:   11/17/17 1845 11/17/17 1945 11/17/17 2000 11/17/17 2015  BP:  (!) 122/52 140/68   Pulse:  88 86 87  Resp: (!) 21   (!) 24  Temp:      TempSrc:      SpO2:  93% 95% 94%  Weight:      Height:        Constitutional: NAD, calm, comfortable Vitals:   11/17/17 1845 11/17/17 1945 11/17/17 2000 11/17/17 2015  BP:  (!) 122/52 140/68   Pulse:  88 86 87  Resp: (!) 21   (!) 24  Temp:      TempSrc:      SpO2:  93% 95% 94%  Weight:      Height:       Eyes: lids and conjunctivae normal ENMT: Mucous membranes are moist.  Neck: normal, supple Respiratory: clear to auscultation bilaterally. Normal respiratory effort. No accessory muscle use.  Currently on room air with oxygen saturation 92%. Cardiovascular: Regular rate and rhythm, no murmurs. No extremity edema. Abdomen: no tenderness, no distention. Bowel sounds positive.  Musculoskeletal:  No joint  deformity upper and lower extremities.   Skin: no rashes, lesions, ulcers.  Psychiatric: Normal judgment and insight. Alert and oriented x 3. Normal mood.   Labs on Admission: I have personally reviewed following labs and imaging studies  CBC: Recent Labs  Lab 11/17/17 1715  WBC 9.0  NEUTROABS 6.5  HGB 11.7*  HCT 36.6  MCV 94.8  PLT 161   Basic Metabolic Panel: Recent Labs  Lab 11/17/17 1715  NA 137  K 4.3  CL 98*  CO2 27  GLUCOSE 110*  BUN 25*  CREATININE 1.14*  CALCIUM 9.1   GFR: Estimated Creatinine Clearance: 30.2 mL/min (A) (by C-G formula based on SCr of 1.14 mg/dL (H)). Liver Function Tests: No results for input(s): AST, ALT, ALKPHOS, BILITOT, PROT, ALBUMIN in the last 168 hours. No results for input(s): LIPASE, AMYLASE in the last 168 hours. No results for input(s): AMMONIA in the last 168 hours. Coagulation Profile: No results for input(s): INR, PROTIME in the last 168 hours. Cardiac Enzymes: Recent Labs  Lab 11/17/17 1715  TROPONINI 0.04*   BNP (last 3 results) No results for input(s): PROBNP in the last 8760 hours. HbA1C: No results for input(s): HGBA1C in the last 72 hours. CBG: No results for input(s): GLUCAP in the last 168 hours. Lipid Profile: No results for input(s): CHOL, HDL, LDLCALC, TRIG, CHOLHDL, LDLDIRECT in the last 72 hours. Thyroid Function Tests: No results for input(s): TSH, T4TOTAL, FREET4, T3FREE, THYROIDAB in the last 72 hours. Anemia Panel: No results for input(s): VITAMINB12, FOLATE, FERRITIN, TIBC, IRON, RETICCTPCT in the last 72 hours. Urine analysis: No results found for: COLORURINE, APPEARANCEUR, LABSPEC, PHURINE, GLUCOSEU, HGBUR, BILIRUBINUR, KETONESUR, PROTEINUR, UROBILINOGEN, NITRITE, LEUKOCYTESUR  Radiological Exams on Admission: Dg Chest 2 View  Result Date: 11/17/2017 CLINICAL DATA:  Nursing home patient with coughing for 1 day. Runny nose. EXAM: CHEST - 2 VIEW COMPARISON:  Radiographs 01/01/2014. FINDINGS:  Stable mild cardiomegaly and aortic atherosclerosis. Possible small hiatal hernia. The lungs are clear. There is no pleural effusion or pneumothorax. No acute osseous findings are demonstrated. There are glenohumeral degenerative changes bilaterally with evidence chronic bilateral rotator cuff tears. There is a thoracolumbar scoliosis. IMPRESSION: No acute cardiopulmonary process. Cardiomegaly and aortic atherosclerosis. Electronically Signed   By: Richardean Sale M.D.   On: 11/17/2017 17:36    EKG: Independently reviewed. SR 88bpm with no ST or T wave changes.  Assessment/Plan Active Problems:   * No active hospital problems. *     1. Viral upper respiratory infection.  Supportive measures with hydration and rest.  Use of breathing treatments as well as nasal sprays, antitussives, and anti-inflammatories as needed.  Symptoms should improve over the next several days.  She does not meet requirements for inpatient admission or observation at this time as she does not demonstrate any fever, leukocytosis, or other significant findings.  I have discussed this with ED physician Dr. Thurnell Garbe.  She should return should she have worsening symptoms to include fever, productive cough, severe dyspnea, chest pain, or other alarming symptoms.  Follow-up with primary care physician as previously scheduled and recommend calling office for earlier appointment if symptoms are persistent.  Pratik Darleen Crocker DO [facility] Pager [phone]  If 7PM-7AM, please contact night-coverage [email or web address] Password [facility]  11/17/2017, 8:24 PM

## 2017-11-17 NOTE — ED Notes (Signed)
Respiratory giving treatment

## 2017-11-17 NOTE — ED Notes (Signed)
Spoke with Brooke from [place] to let her know that pt was being discharged. Jerene Pitch stated that she would send someone to pick up pt.

## 2017-12-17 DIAGNOSIS — B351 Tinea unguium: Secondary | ICD-10-CM | POA: Diagnosis not present

## 2017-12-17 DIAGNOSIS — M79674 Pain in right toe(s): Secondary | ICD-10-CM | POA: Diagnosis not present

## 2017-12-17 DIAGNOSIS — M79675 Pain in left toe(s): Secondary | ICD-10-CM | POA: Diagnosis not present

## 2018-01-16 DIAGNOSIS — H353131 Nonexudative age-related macular degeneration, bilateral, early dry stage: Secondary | ICD-10-CM | POA: Diagnosis not present

## 2018-02-18 DIAGNOSIS — B351 Tinea unguium: Secondary | ICD-10-CM | POA: Diagnosis not present

## 2018-02-18 DIAGNOSIS — M79674 Pain in right toe(s): Secondary | ICD-10-CM | POA: Diagnosis not present

## 2018-02-18 DIAGNOSIS — M79675 Pain in left toe(s): Secondary | ICD-10-CM | POA: Diagnosis not present

## 2018-04-22 DIAGNOSIS — M79674 Pain in right toe(s): Secondary | ICD-10-CM | POA: Diagnosis not present

## 2018-04-22 DIAGNOSIS — M79675 Pain in left toe(s): Secondary | ICD-10-CM | POA: Diagnosis not present

## 2018-04-22 DIAGNOSIS — B351 Tinea unguium: Secondary | ICD-10-CM | POA: Diagnosis not present

## 2018-05-14 DIAGNOSIS — E039 Hypothyroidism, unspecified: Secondary | ICD-10-CM | POA: Diagnosis not present

## 2018-07-15 NOTE — Progress Notes (Signed)
Cardiology Office Note  Date: 07/16/2018   ID: [name], DOB [birth year], MRN [id number]  PCP: Glenda Chroman, MD  Primary Cardiologist: Rozann Lesches, MD   Chief Complaint  Patient presents with  . Coronary Artery Disease    History of Present Illness: Lisa Kemp is a 84 y.o. female not seen since December 2017.  She continues to reside at [facility].  She presents today reporting no obvious angina symptoms or worsening shortness of breath with typical activities.  She is using a rolling walker.  Denies any recent falls.  Continues to follow with Dr. Woody Seller for primary care.  Reviewed her medications.  Current cardiac regimen includes aspirin, atenolol, lisinopril, Lasix with potassium supplements, and as needed nitroglycerin.  She has a statin intolerance.  I reviewed her ECG from March of this year as outlined below.   Past Medical History:  Diagnosis Date  . Arthritis   . CHF (congestive heart failure) ([place])   . Colon polyps   . Coronary atherosclerosis of native coronary artery    Nonobstructive at catherization 2006  . Dementia ([place])   . Esophageal ring    Distal esophageal ring status post dilation  . Gastrointestinal bleed    Related to NSAIDs  . GERD (gastroesophageal reflux disease)   . Hiatal hernia    Moderate sized sliding hiatal hernia  . Hypertension   . Hypothyroidism   . Intracranial hemorrhage ([place])    Left subcortical 2015  . Mixed hyperlipidemia    Statin intolerant  . Neuropathy     Past Surgical History:  Procedure Laterality Date  . APPENDECTOMY    . CATARACT EXTRACTION    . COLON SURGERY    . TOTAL ABDOMINAL HYSTERECTOMY    . TOTAL ABDOMINAL HYSTERECTOMY W/ BILATERAL SALPINGOOPHORECTOMY      Current Outpatient Medications  Medication Sig Dispense Refill  . aspirin 81 MG tablet Take 81 mg by mouth daily.    [name] atenolol (TENORMIN) 25 MG tablet TAKE ONE TABLET BY MOUTH TWICE DAILY 180 tablet 3  . calcium-vitamin D  (OSCAL WITH D) 500-200 MG-UNIT per tablet Take 1 tablet by mouth 2 (two) times daily.      . Cholecalciferol (VITAMIN D3) 2000 units TABS Take 1 tablet by mouth daily.    . Coenzyme Q10 (COQ-10) 200 MG CAPS Take 1 capsule by mouth daily.      . ferrous sulfate 325 (65 FE) MG tablet Take 325 mg by mouth daily.    . furosemide (LASIX) 20 MG tablet TAKE ONE TABLET BY MOUTH DAILY 30 tablet 6  . gabapentin (NEURONTIN) 100 MG capsule Take 1 capsule (100 mg total) by mouth at bedtime. 30 capsule 1  . ibuprofen (ADVIL,MOTRIN) 200 MG tablet Take 200 mg by mouth every 6 (six) hours as needed for mild pain.    [name] levothyroxine (SYNTHROID, LEVOTHROID) 112 MCG tablet Take 1 tablet (112 mcg total) by mouth daily. (Patient taking differently: Take 125 mcg by mouth daily. ) 30 tablet 1  . lisinopril (PRINIVIL,ZESTRIL) 5 MG tablet Take 5 mg by mouth daily.    [name] LORazepam (ATIVAN) 0.5 MG tablet Take 0.5 mg by mouth every 6 (six) hours as needed for anxiety.    . Magnesium 250 MG TABS Take 1 tablet by mouth daily.      . nitroGLYCERIN (NITROSTAT) 0.4 MG SL tablet Place 1 tablet (0.4 mg total) under the tongue as directed. 25 tablet 3  . omeprazole (PRILOSEC) 20 MG capsule  Take 20 mg by mouth daily.    . ondansetron (ZOFRAN) 4 MG tablet TAKE 1 TABLET BY MOUTH TWICE DAILY AS NEEDED FOR NAUSEA. 20 tablet 0  . Oyster Shell (OYSTER CALCIUM) 500 MG TABS tablet Take 500 mg of elemental calcium by mouth daily.    . potassium chloride (K-DUR) 10 MEQ tablet TAKE 1 tab daily with lasix 30 tablet 3  . ranitidine (ZANTAC) 150 MG tablet Take 150-300 mg by mouth 2 (two) times daily. 2 tablets in AM and 1 tablet in PM    . simethicone (MYLICON) 80 MG chewable tablet Chew 80 mg by mouth 2 (two) times daily.    [name] trolamine salicylate (ASPERCREME) 10 % cream Apply 1 application topically 2 (two) times daily as needed for muscle pain.    . vitamin B-12 (CYANOCOBALAMIN) 500 MCG tablet Take 500 mcg by mouth daily.     No current  facility-administered medications for this visit.    Allergies:  Diazepam and Statins   Social History: The patient  reports that she has never smoked. She has never used smokeless tobacco. She reports that she does not drink alcohol or use drugs.   ROS:  Please see the history of present illness. Otherwise, complete review of systems is positive for hearing loss.  All other systems are reviewed and negative.   Physical Exam: VS:  BP (!) 154/76 (BP Location: Right Arm)   Pulse 82   Ht 5\' 3"  (1.6 m)   Wt 159 lb (72.1 kg)   SpO2 93%   BMI 28.17 kg/m , BMI Body mass index is 28.17 kg/m.  Wt Readings from Last 3 Encounters:  07/16/18 159 lb (72.1 kg)  11/17/17 147 lb (66.7 kg)  08/07/16 146 lb 9.6 oz (66.5 kg)    General: Elderly woman, using a rolling walker.  Appears comfortable. HEENT: Conjunctiva and lids normal, oropharynx clear. Neck: Supple, no elevated JVP or carotid bruits, no thyromegaly. Lungs: Clear to auscultation, nonlabored breathing at rest. Cardiac: Regular rate and rhythm, no S3, soft systolic murmur. Abdomen: Soft, nontender, bowel sounds present. Extremities: Mild ankle edema, distal pulses 1-2+.  ECG: I personally reviewed the tracing from 11/18/2017 which showed normal sinus rhythm.  Recent Labwork: 11/17/2017: B Natriuretic Peptide 142.0; BUN 25; Creatinine, Ser 1.14; Hemoglobin 11.7; Platelets 187; Potassium 4.3; Sodium 137     Component Value Date/Time   CHOL 157 12/23/2013 0228   TRIG 188 (H) 12/23/2013 0228   HDL 35 (L) 12/23/2013 0228   CHOLHDL 4.5 12/23/2013 0228   VLDL 38 12/23/2013 0228   LDLCALC 84 12/23/2013 0228    Other Studies Reviewed Today:  Carotid Dopplers 10/13/2015: 1 to 39% bilateral ICA stenoses.  Echocardiogram 12/23/2013: Study Conclusions  - Left ventricle: The cavity size was normal. Wall thickness was normal. Systolic function was vigorous. The estimated ejection fraction was in the range of 65% to 70%. Wall motion  was normal; there were no regional wall motion abnormalities. - Mitral valve: Moderate regurgitation directed eccentrically and posteriorly. - Left atrium: The atrium was mildly dilated. Impressions:  - Image quality limits evaluation of the mitral valve. Cannot exclude systolic anterior motion of the anterior leaflet as the underlying mechansm for MR.  Assessment and Plan:  1.  History of nonobstructive CAD.  No active angina symptoms.  ECG from March was normal.  Continue aspirin and beta-blocker.  She has a statin intolerance.  2.  Essential hypertension, no changes made to present regimen.  Blood pressure is mildly  elevated today.  Keep follow-up with Dr. Woody Seller.  Current medicines were reviewed with the patient today.  Disposition: Follow-up in one year.  Signed, Satira Sark, MD, [facility] 07/16/2018 11:56 AM    [place] [facility] [place]. [address], [place], [place] [address] Phone: [phone]; Fax: [phone]

## 2018-07-16 ENCOUNTER — Ambulatory Visit (INDEPENDENT_AMBULATORY_CARE_PROVIDER_SITE_OTHER): Payer: Medicare HMO | Admitting: Cardiology

## 2018-07-16 ENCOUNTER — Encounter: Payer: Self-pay | Admitting: Cardiology

## 2018-07-16 VITALS — BP 154/76 | HR 82 | Ht 63.0 in | Wt 159.0 lb

## 2018-07-16 DIAGNOSIS — I2510 Atherosclerotic heart disease of native coronary artery without angina pectoris: Secondary | ICD-10-CM | POA: Diagnosis not present

## 2018-07-16 DIAGNOSIS — I10 Essential (primary) hypertension: Secondary | ICD-10-CM | POA: Diagnosis not present

## 2018-07-16 NOTE — Patient Instructions (Signed)
Medication Instructions:  Your physician recommends that you continue on your current medications as directed. Please refer to the Current Medication list given to you today.  If you need a refill on your cardiac medications before your next appointment, please call your pharmacy.   Lab work: none If you have labs (blood work) drawn today and your tests are completely normal, you will receive your results only by: [name] MyChart Message (if you have MyChart) OR . A paper copy in the mail If you have any lab test that is abnormal or we need to change your treatment, we will call you to review the results.  Testing/Procedures: none  Follow-Up: At [facility], you and your health needs are our priority.  As part of our continuing mission to provide you with exceptional heart care, we have created designated Provider Care Teams.  These Care Teams include your primary Cardiologist (physician) and Advanced Practice Providers (APPs -  Physician Assistants and Nurse Practitioners) who all work together to provide you with the care you need, when you need it. You will need a follow up appointment in 1 years.  Please call our office 2 months in advance to schedule this appointment.  You may see Rozann Lesches, MD or one of the following Advanced Practice Providers on your designated Care Team:   Bernerd Pho, PA-C [facility]) . Ermalinda Barrios, PA-C ([place])  Any Other Special Instructions Will Be Listed Below (If Applicable). None

## 2018-07-29 DIAGNOSIS — M79674 Pain in right toe(s): Secondary | ICD-10-CM | POA: Diagnosis not present

## 2018-07-29 DIAGNOSIS — B351 Tinea unguium: Secondary | ICD-10-CM | POA: Diagnosis not present

## 2018-07-29 DIAGNOSIS — M79675 Pain in left toe(s): Secondary | ICD-10-CM | POA: Diagnosis not present

## 2018-09-11 ENCOUNTER — Other Ambulatory Visit: Payer: Self-pay

## 2018-09-11 ENCOUNTER — Encounter (HOSPITAL_COMMUNITY): Payer: Self-pay

## 2018-09-11 ENCOUNTER — Emergency Department (HOSPITAL_COMMUNITY): Payer: Medicare HMO

## 2018-09-11 ENCOUNTER — Inpatient Hospital Stay (HOSPITAL_COMMUNITY)
Admission: EM | Admit: 2018-09-11 | Discharge: 2018-09-15 | DRG: 193 | Disposition: A | Discharge status: Skilled Nursing Facility | Payer: Medicare HMO | Attending: Internal Medicine | Admitting: Internal Medicine

## 2018-09-11 DIAGNOSIS — I5032 Chronic diastolic (congestive) heart failure: Secondary | ICD-10-CM | POA: Diagnosis present

## 2018-09-11 DIAGNOSIS — G629 Polyneuropathy, unspecified: Secondary | ICD-10-CM | POA: Diagnosis present

## 2018-09-11 DIAGNOSIS — J9601 Acute respiratory failure with hypoxia: Secondary | ICD-10-CM | POA: Diagnosis present

## 2018-09-11 DIAGNOSIS — Z87440 Personal history of urinary (tract) infections: Secondary | ICD-10-CM

## 2018-09-11 DIAGNOSIS — E039 Hypothyroidism, unspecified: Secondary | ICD-10-CM | POA: Diagnosis not present

## 2018-09-11 DIAGNOSIS — Z90710 Acquired absence of both cervix and uterus: Secondary | ICD-10-CM

## 2018-09-11 DIAGNOSIS — Z8673 Personal history of transient ischemic attack (TIA), and cerebral infarction without residual deficits: Secondary | ICD-10-CM | POA: Diagnosis not present

## 2018-09-11 DIAGNOSIS — E782 Mixed hyperlipidemia: Secondary | ICD-10-CM | POA: Diagnosis not present

## 2018-09-11 DIAGNOSIS — Z9849 Cataract extraction status, unspecified eye: Secondary | ICD-10-CM | POA: Diagnosis not present

## 2018-09-11 DIAGNOSIS — E871 Hypo-osmolality and hyponatremia: Secondary | ICD-10-CM

## 2018-09-11 DIAGNOSIS — Y95 Nosocomial condition: Secondary | ICD-10-CM | POA: Diagnosis present

## 2018-09-11 DIAGNOSIS — I6529 Occlusion and stenosis of unspecified carotid artery: Secondary | ICD-10-CM | POA: Diagnosis present

## 2018-09-11 DIAGNOSIS — J181 Lobar pneumonia, unspecified organism: Secondary | ICD-10-CM

## 2018-09-11 DIAGNOSIS — R7989 Other specified abnormal findings of blood chemistry: Secondary | ICD-10-CM | POA: Diagnosis present

## 2018-09-11 DIAGNOSIS — Z7982 Long term (current) use of aspirin: Secondary | ICD-10-CM | POA: Diagnosis not present

## 2018-09-11 DIAGNOSIS — Z888 Allergy status to other drugs, medicaments and biological substances status: Secondary | ICD-10-CM | POA: Diagnosis not present

## 2018-09-11 DIAGNOSIS — I6523 Occlusion and stenosis of bilateral carotid arteries: Secondary | ICD-10-CM | POA: Diagnosis not present

## 2018-09-11 DIAGNOSIS — I248 Other forms of acute ischemic heart disease: Secondary | ICD-10-CM | POA: Diagnosis present

## 2018-09-11 DIAGNOSIS — Z9079 Acquired absence of other genital organ(s): Secondary | ICD-10-CM

## 2018-09-11 DIAGNOSIS — R778 Other specified abnormalities of plasma proteins: Secondary | ICD-10-CM | POA: Diagnosis present

## 2018-09-11 DIAGNOSIS — I10 Essential (primary) hypertension: Secondary | ICD-10-CM | POA: Diagnosis not present

## 2018-09-11 DIAGNOSIS — J189 Pneumonia, unspecified organism: Secondary | ICD-10-CM | POA: Diagnosis not present

## 2018-09-11 DIAGNOSIS — Z79890 Hormone replacement therapy (postmenopausal): Secondary | ICD-10-CM

## 2018-09-11 DIAGNOSIS — Z8249 Family history of ischemic heart disease and other diseases of the circulatory system: Secondary | ICD-10-CM

## 2018-09-11 DIAGNOSIS — I110 Hypertensive heart disease with heart failure: Secondary | ICD-10-CM | POA: Diagnosis present

## 2018-09-11 DIAGNOSIS — I2510 Atherosclerotic heart disease of native coronary artery without angina pectoris: Secondary | ICD-10-CM | POA: Diagnosis present

## 2018-09-11 DIAGNOSIS — E860 Dehydration: Secondary | ICD-10-CM | POA: Diagnosis present

## 2018-09-11 DIAGNOSIS — K219 Gastro-esophageal reflux disease without esophagitis: Secondary | ICD-10-CM | POA: Diagnosis not present

## 2018-09-11 DIAGNOSIS — M1712 Unilateral primary osteoarthritis, left knee: Secondary | ICD-10-CM | POA: Diagnosis present

## 2018-09-11 DIAGNOSIS — R05 Cough: Secondary | ICD-10-CM | POA: Diagnosis not present

## 2018-09-11 DIAGNOSIS — R0602 Shortness of breath: Secondary | ICD-10-CM | POA: Diagnosis not present

## 2018-09-11 DIAGNOSIS — F0390 Unspecified dementia without behavioral disturbance: Secondary | ICD-10-CM | POA: Diagnosis present

## 2018-09-11 DIAGNOSIS — Z79899 Other long term (current) drug therapy: Secondary | ICD-10-CM

## 2018-09-11 DIAGNOSIS — Z66 Do not resuscitate: Secondary | ICD-10-CM | POA: Diagnosis present

## 2018-09-11 LAB — CBC WITH DIFFERENTIAL/PLATELET
Abs Immature Granulocytes: 0.65 10*3/uL — ABNORMAL HIGH (ref 0.00–0.07)
Basophils Absolute: 0.1 10*3/uL (ref 0.0–0.1)
Basophils Relative: 1 %
Eosinophils Absolute: 0.1 10*3/uL (ref 0.0–0.5)
Eosinophils Relative: 1 %
HCT: 39.8 % (ref 36.0–46.0)
Hemoglobin: 12.9 g/dL (ref 12.0–15.0)
Immature Granulocytes: 5 %
LYMPHS ABS: 1.6 10*3/uL (ref 0.7–4.0)
Lymphocytes Relative: 13 %
MCH: 29.3 pg (ref 26.0–34.0)
MCHC: 32.4 g/dL (ref 30.0–36.0)
MCV: 90.5 fL (ref 80.0–100.0)
Monocytes Absolute: 0.8 10*3/uL (ref 0.1–1.0)
Monocytes Relative: 7 %
Neutro Abs: 8.8 10*3/uL — ABNORMAL HIGH (ref 1.7–7.7)
Neutrophils Relative %: 73 %
Platelets: 288 10*3/uL (ref 150–400)
RBC: 4.4 MIL/uL (ref 3.87–5.11)
RDW: 12.9 % (ref 11.5–15.5)
WBC: 12 10*3/uL — ABNORMAL HIGH (ref 4.0–10.5)
nRBC: 0 % (ref 0.0–0.2)

## 2018-09-11 LAB — COMPREHENSIVE METABOLIC PANEL
ALK PHOS: 50 U/L (ref 38–126)
ALT: 20 U/L (ref 0–44)
AST: 26 U/L (ref 15–41)
Albumin: 3 g/dL — ABNORMAL LOW (ref 3.5–5.0)
Anion gap: 9 (ref 5–15)
BUN: 14 mg/dL (ref 8–23)
CO2: 32 mmol/L (ref 22–32)
Calcium: 9 mg/dL (ref 8.9–10.3)
Chloride: 86 mmol/L — ABNORMAL LOW (ref 98–111)
Creatinine, Ser: 0.81 mg/dL (ref 0.44–1.00)
GFR calc Af Amer: >60 mL/min (ref >60)
GFR calc non Af Amer: >60 mL/min (ref >60)
GLUCOSE: 129 mg/dL — AB (ref 70–99)
Potassium: 3.9 mmol/L (ref 3.5–5.1)
Sodium: 127 mmol/L — ABNORMAL LOW (ref 135–145)
Total Bilirubin: 0.6 mg/dL (ref 0.3–1.2)
Total Protein: 6.5 g/dL (ref 6.5–8.1)

## 2018-09-11 LAB — BRAIN NATRIURETIC PEPTIDE: B Natriuretic Peptide: 220 pg/mL — ABNORMAL HIGH (ref 0.0–100.0)

## 2018-09-11 LAB — I-STAT CG4 LACTIC ACID, ED: Lactic Acid, Venous: 1.36 mmol/L (ref 0.5–1.9)

## 2018-09-11 LAB — MAGNESIUM: Magnesium: 1.8 mg/dL (ref 1.7–2.4)

## 2018-09-11 LAB — LACTIC ACID, PLASMA: LACTIC ACID, VENOUS: 1.3 mmol/L (ref 0.5–1.9)

## 2018-09-11 LAB — TROPONIN I: TROPONIN I: 0.13 ng/mL — AB (ref <0.03)

## 2018-09-11 LAB — PHOSPHORUS: Phosphorus: 3.4 mg/dL (ref 2.5–4.6)

## 2018-09-11 MED ORDER — VANCOMYCIN HCL IN DEXTROSE 1-5 GM/200ML-% IV SOLN
1000.0000 mg | Freq: Once | INTRAVENOUS | Status: AC
  Administered 2018-09-12: 1000 mg via INTRAVENOUS
  Filled 2018-09-11: qty 200

## 2018-09-11 MED ORDER — POTASSIUM CHLORIDE CRYS ER 10 MEQ PO TBCR
10.0000 meq | EXTENDED_RELEASE_TABLET | Freq: Every day | ORAL | Status: DC
  Administered 2018-09-13 – 2018-09-14 (×2): 10 meq via ORAL
  Filled 2018-09-11 (×8): qty 1

## 2018-09-11 MED ORDER — SIMETHICONE 80 MG PO CHEW
80.0000 mg | CHEWABLE_TABLET | Freq: Two times a day (BID) | ORAL | Status: DC
  Administered 2018-09-13 – 2018-09-14 (×4): 80 mg via ORAL
  Filled 2018-09-11 (×7): qty 1

## 2018-09-11 MED ORDER — PANTOPRAZOLE SODIUM 40 MG PO TBEC
40.0000 mg | DELAYED_RELEASE_TABLET | Freq: Every day | ORAL | Status: DC
  Administered 2018-09-13 – 2018-09-14 (×2): 40 mg via ORAL
  Filled 2018-09-11 (×4): qty 1

## 2018-09-11 MED ORDER — DEXTROMETHORPHAN-GUAIFENESIN 10-100 MG/5ML PO LIQD
5.0000 mL | Freq: Four times a day (QID) | ORAL | Status: DC | PRN
  Filled 2018-09-11: qty 5

## 2018-09-11 MED ORDER — GABAPENTIN 100 MG PO CAPS
100.0000 mg | ORAL_CAPSULE | Freq: Every day | ORAL | Status: DC
  Administered 2018-09-12 – 2018-09-14 (×3): 100 mg via ORAL
  Filled 2018-09-11 (×3): qty 1

## 2018-09-11 MED ORDER — LISINOPRIL 5 MG PO TABS
5.0000 mg | ORAL_TABLET | Freq: Every day | ORAL | Status: DC
  Administered 2018-09-13 – 2018-09-14 (×2): 5 mg via ORAL
  Filled 2018-09-11 (×3): qty 1

## 2018-09-11 MED ORDER — FAMOTIDINE 20 MG PO TABS
20.0000 mg | ORAL_TABLET | Freq: Two times a day (BID) | ORAL | Status: DC
  Administered 2018-09-13 – 2018-09-14 (×4): 20 mg via ORAL
  Filled 2018-09-11 (×7): qty 1

## 2018-09-11 MED ORDER — ASPIRIN EC 81 MG PO TBEC
81.0000 mg | DELAYED_RELEASE_TABLET | Freq: Every day | ORAL | Status: DC
  Administered 2018-09-13 – 2018-09-14 (×2): 81 mg via ORAL
  Filled 2018-09-11 (×4): qty 1

## 2018-09-11 MED ORDER — LEVOTHYROXINE SODIUM 75 MCG PO TABS
150.0000 ug | ORAL_TABLET | Freq: Every day | ORAL | Status: DC
  Administered 2018-09-14 – 2018-09-15 (×2): 150 ug via ORAL
  Filled 2018-09-11 (×2): qty 2

## 2018-09-11 MED ORDER — ATENOLOL 25 MG PO TABS
25.0000 mg | ORAL_TABLET | Freq: Two times a day (BID) | ORAL | Status: DC
  Administered 2018-09-13 – 2018-09-14 (×4): 25 mg via ORAL
  Filled 2018-09-11 (×7): qty 1

## 2018-09-11 MED ORDER — SACCHAROMYCES BOULARDII 250 MG PO CAPS
250.0000 mg | ORAL_CAPSULE | Freq: Every day | ORAL | Status: DC
  Administered 2018-09-13 – 2018-09-14 (×2): 250 mg via ORAL
  Filled 2018-09-11 (×5): qty 1

## 2018-09-11 MED ORDER — SODIUM CHLORIDE 0.9 % IV SOLN
1.0000 g | INTRAVENOUS | Status: DC
  Administered 2018-09-12 (×2): 1 g via INTRAVENOUS
  Filled 2018-09-11 (×4): qty 1

## 2018-09-11 MED ORDER — SODIUM CHLORIDE 0.9 % IV SOLN
1.0000 g | Freq: Once | INTRAVENOUS | Status: AC
  Administered 2018-09-11: 1 g via INTRAVENOUS
  Filled 2018-09-11: qty 10

## 2018-09-11 MED ORDER — ENOXAPARIN SODIUM 40 MG/0.4ML ~~LOC~~ SOLN
40.0000 mg | SUBCUTANEOUS | Status: DC
  Administered 2018-09-12 – 2018-09-15 (×3): 40 mg via SUBCUTANEOUS
  Filled 2018-09-11 (×4): qty 0.4

## 2018-09-11 MED ORDER — FUROSEMIDE 40 MG PO TABS: 20.0000 mg | ORAL_TABLET | Freq: Every day | ORAL | Status: DC

## 2018-09-11 MED ORDER — NITROGLYCERIN 0.4 MG SL SUBL: 0.4000 mg | SUBLINGUAL_TABLET | SUBLINGUAL | Status: DC

## 2018-09-11 MED ORDER — TROLAMINE SALICYLATE 10 % EX CREA
1.0000 "application " | TOPICAL_CREAM | Freq: Two times a day (BID) | CUTANEOUS | Status: DC | PRN
  Filled 2018-09-11: qty 85

## 2018-09-11 MED ORDER — LORAZEPAM 0.5 MG PO TABS: 0.5000 mg | ORAL_TABLET | ORAL | Status: DC

## 2018-09-11 MED ORDER — SODIUM CHLORIDE 0.9 % IV BOLUS
250.0000 mL | Freq: Once | INTRAVENOUS | Status: AC
  Administered 2018-09-11: 250 mL via INTRAVENOUS

## 2018-09-11 NOTE — H&P (Signed)
History and Physical    Lisa Kemp ZHG:[id number] DOB: 04-27-1924 DOA: 09/11/2018  PCP: Glenda Chroman, MD   Patient coming from: Home.  I have personally briefly reviewed patient's old medical records in [place]  Chief Complaint: AMS.  HPI: Lisa Kemp is a 85 y.o. female with medical history significant of osteoarthritis, diastolic CHF, colon polyps, gastrointestinal bleed, GERD, hiatal hernia, hypertension, hypothyroidism, history of intracranial hemorrhage, mixed hyperlipidemia, peripheral neuropathy who is brought from her facility [facility]) due to generalized weakness and AMS.  She is normally able to walk with a walker, but has been unable to ambulate with a due to generalized weakness.  [facility] staff stated that she was recently treated for influenza and is currently being treated with amoxicillin for an UTI.No further history is available.  ED Course: Initial vital signs pulse 59, respirations 24, blood pressure 143/66 mmHg and O2 sat 98% on room air.  She was given a 250 mL NS bolus and a gram of ceftriaxone in the emergency department.  I added magnesium sulfate 2 g IVPB due to borderline prolonged QT segment.  Lactic acid x2 was normal.  CBC showed a white count of 12.0, hemoglobin 12.9 g/dL and platelets 288.  BNP was 220.0 pg/mL.  Troponin 0 0.13 ng/mL.  Her sodium was 127 and chloride 86 mmol/L.  Her glucose is 129 mg/dL and albumin 3.0 g/dL.  All other values are within normal limits.    Review of Systems: Unable to obtain.   Past Medical History:  Diagnosis Date  . Arthritis   . CHF (congestive heart failure) ([place])   . Colon polyps   . Coronary atherosclerosis of native coronary artery    Nonobstructive at catherization 2006  . Dementia ([place])   . Esophageal ring    Distal esophageal ring status post dilation  . Gastrointestinal bleed    Related to NSAIDs  . GERD (gastroesophageal reflux disease)   . Hiatal hernia    Moderate sized  sliding hiatal hernia  . Hypertension   . Hypothyroidism   . Intracranial hemorrhage ([place])    Left subcortical 2015  . Mixed hyperlipidemia    Statin intolerant  . Neuropathy     Past Surgical History:  Procedure Laterality Date  . APPENDECTOMY    . CATARACT EXTRACTION    . COLON SURGERY    . TOTAL ABDOMINAL HYSTERECTOMY    . TOTAL ABDOMINAL HYSTERECTOMY W/ BILATERAL SALPINGOOPHORECTOMY       reports that she has never smoked. She has never used smokeless tobacco. She reports that she does not drink alcohol or use drugs.  Allergies  Allergen Reactions  . Diazepam Other (See Comments)    Could not walk  . Statins Other (See Comments)    CRAMPING    Family History  Problem Relation Age of Onset  . Heart attack Mother   . Heart attack Father   . Hypertension Other    Prior to Admission medications   Medication Sig Start Date End Date Taking? Authorizing Provider  acetaminophen (TYLENOL) 500 MG tablet Take 500 mg by mouth every 8 (eight) hours as needed for mild pain or moderate pain.   Yes [provider]  amoxicillin-clavulanate (AUGMENTIN) 875-125 MG tablet Take 1 tablet by mouth 2 (two) times daily. 10 day course starting on 09/11/2018   Yes [provider]  aspirin 81 MG tablet Take 81 mg by mouth daily.   Yes [provider]  atenolol (TENORMIN)  25 MG tablet TAKE ONE TABLET BY MOUTH TWICE DAILY Patient taking differently: Take 25 mg by mouth 2 (two) times daily.  03/08/16  Yes Satira Sark, MD  calcium-vitamin D (OSCAL WITH D) 500-200 MG-UNIT per tablet Take 1 tablet by mouth 2 (two) times daily.     Yes [provider]  Cholecalciferol (VITAMIN D3) 2000 units TABS Take 1 tablet by mouth daily.   Yes [provider]  dextromethorphan-guaiFENesin (TUSSIN DM) 10-100 MG/5ML liquid Take 5 mLs by mouth every 6 (six) hours as needed for cough.   Yes [provider]  ferrous sulfate 325 (65 FE) MG tablet Take 325 mg by  mouth daily.   Yes [provider]  furosemide (LASIX) 20 MG tablet TAKE ONE TABLET BY MOUTH DAILY Patient taking differently: Take 20 mg by mouth daily.  08/13/16  Yes Satira Sark, MD  gabapentin (NEURONTIN) 100 MG capsule Take 1 capsule (100 mg total) by mouth at bedtime. 01/01/14  Yes Angiulli, Lavon Paganini, PA-C  ibuprofen (ADVIL,MOTRIN) 200 MG tablet Take 200 mg by mouth every 6 (six) hours as needed for mild pain.   Yes [provider]  levothyroxine (SYNTHROID, LEVOTHROID) 150 MCG tablet Take 150 mcg by mouth daily before breakfast.   Yes [provider]  lisinopril (PRINIVIL,ZESTRIL) 5 MG tablet Take 5 mg by mouth daily.   Yes [provider]  loperamide (IMODIUM A-D) 2 MG tablet Take 2 mg by mouth 4 (four) times daily as needed for diarrhea or loose stools.   Yes [provider]  LORazepam (ATIVAN) 1 MG tablet Take 0.5-1 mg by mouth See admin instructions. Take 0.5mg  by mouth daily in the evening. Take 1mg   At bedtime   Yes [provider]  nitroGLYCERIN (NITROSTAT) 0.4 MG SL tablet Place 1 tablet (0.4 mg total) under the tongue as directed. 06/19/16  Yes Satira Sark, MD  omeprazole (PRILOSEC) 40 MG capsule Take 40 mg by mouth daily.    Yes [provider]  ondansetron (ZOFRAN) 4 MG tablet TAKE 1 TABLET BY MOUTH TWICE DAILY AS NEEDED FOR NAUSEA. Patient taking differently: Take 4 mg by mouth 3 (three) times daily as needed for nausea or vomiting.  12/27/16  Yes Rehman, Mechele Dawley, MD  Oyster Shell (OYSTER CALCIUM) 500 MG TABS tablet Take 500 mg of elemental calcium by mouth 2 (two) times daily.    Yes [provider]  potassium chloride (K-DUR) 10 MEQ tablet TAKE 1 tab daily with lasix Patient taking differently: Take 10 mEq by mouth daily.  10/31/15  Yes Satira Sark, MD  ranitidine (ZANTAC) 150 MG tablet Take 150-300 mg by mouth 2 (two) times daily. 2 tablets in AM and 1 tablet in PM 01/01/14  Yes Angiulli,  Lavon Paganini, PA-C  saccharomyces boulardii (FLORASTOR) 250 MG capsule Take 250 mg by mouth daily. 10 day course starting on 09/12/2018   Yes [provider]  simethicone (MYLICON) 80 MG chewable tablet Chew 80 mg by mouth 2 (two) times daily.   Yes [provider]  trolamine salicylate (ASPERCREME) 10 % cream Apply 1 application topically 2 (two) times daily as needed for muscle pain.   Yes [provider]  vitamin B-12 (CYANOCOBALAMIN) 500 MCG tablet Take 500 mcg by mouth daily.   Yes [provider]  oseltamivir (TAMIFLU) 75 MG capsule Take 75 mg by mouth 2 (two) times daily. 5 day course starting on 09/04/2018    [provider]  Physical Exam: Vitals:   09/11/18 2042 09/11/18 2130 09/11/18 2230 09/11/18 2300  BP:  (!) 143/66 125/70 (!) 135/59  Pulse:  (!) 59 71 63  Resp:  (!) 24 (!) 22 (!) 25  SpO2:  98% 99% 98%  Weight: 72.1 kg       Constitutional: Somnolent, but in NAD, calm, comfortable. Eyes: PERRL, lids and conjunctivae normal ENMT: Mucous membranes are dry. Posterior pharynx clear of any exudate or lesions. Neck: normal, supple, no masses, no thyromegaly Respiratory: Decreased breath sounds on bases with bilateral rhonchi, no wheezing, no crackles. Normal respiratory effort. No accessory muscle use.  Cardiovascular: Regular rate and rhythm, no murmurs / rubs / gallops. No extremity edema. 2+ pedal pulses. No carotid bruits.  Abdomen: Soft, no tenderness, no masses palpated. No hepatosplenomegaly. Bowel sounds positive.  Musculoskeletal: no clubbing / cyanosis. Good ROM, no contractures.  Relaxed muscle tone.  Skin: Multiple areas of ecchymosis in different stages of healing on both forearms. Neurologic: Somnolent, moves all extremities. Psychiatric: Somnolent, but wakes up briefly.  Oriented to name only.  Labs on Admission: I have personally reviewed following labs and imaging studies  CBC: Recent Labs  Lab 09/11/18 2101  WBC  12.0*  NEUTROABS 8.8*  HGB 12.9  HCT 39.8  MCV 90.5  PLT 841   Basic Metabolic Panel: Recent Labs  Lab 09/11/18 2101  NA 127*  K 3.9  CL 86*  CO2 32  GLUCOSE 129*  BUN 14  CREATININE 0.81  CALCIUM 9.0   GFR: Estimated Creatinine Clearance: 42.2 mL/min (by C-G formula based on SCr of 0.81 mg/dL). Liver Function Tests: Recent Labs  Lab 09/11/18 2101  AST 26  ALT 20  ALKPHOS 50  BILITOT 0.6  PROT 6.5  ALBUMIN 3.0*   No results for input(s): LIPASE, AMYLASE in the last 168 hours. No results for input(s): AMMONIA in the last 168 hours. Coagulation Profile: No results for input(s): INR, PROTIME in the last 168 hours. Cardiac Enzymes: Recent Labs  Lab 09/11/18 2101  TROPONINI 0.13*   BNP (last 3 results) No results for input(s): PROBNP in the last 8760 hours. HbA1C: No results for input(s): HGBA1C in the last 72 hours. CBG: No results for input(s): GLUCAP in the last 168 hours. Lipid Profile: No results for input(s): CHOL, HDL, LDLCALC, TRIG, CHOLHDL, LDLDIRECT in the last 72 hours. Thyroid Function Tests: No results for input(s): TSH, T4TOTAL, FREET4, T3FREE, THYROIDAB in the last 72 hours. Anemia Panel: No results for input(s): VITAMINB12, FOLATE, FERRITIN, TIBC, IRON, RETICCTPCT in the last 72 hours. Urine analysis: No results found for: COLORURINE, APPEARANCEUR, LABSPEC, PHURINE, GLUCOSEU, HGBUR, BILIRUBINUR, KETONESUR, PROTEINUR, UROBILINOGEN, NITRITE, LEUKOCYTESUR  Radiological Exams on Admission: Dg Chest Portable 1 View  Result Date: 09/11/2018 CLINICAL DATA:  Shortness of breath, dementia, altered mental status, weakness EXAM: PORTABLE CHEST 1 VIEW COMPARISON:  11/17/2017 FINDINGS: Increased interstitial markings. Mild patchy left upper lobe opacity, suspicious for pneumonia. Mild patchy right basilar opacity, atelectasis versus pneumonia. No definite pleural effusions. No pneumothorax. The heart is top-normal in size.  Thoracic aortic atherosclerosis.  IMPRESSION: Mild patchy left upper lobe opacity, suspicious for pneumonia. Mild right basilar opacity, atelectasis versus pneumonia. Electronically Signed   By: Julian Hy M.D.   On: 09/11/2018 21:32    EKG: Independently reviewed. Vent. rate 74 BPM PR interval * ms QRS duration 107 ms QT/QTc 438/486 ms P-R-T axes 30 50 27 Sinus rhythm Probable left atrial enlargement Borderline repol abnormality, diffuse leads Borderline prolonged QT interval  Assessment/Plan Principal Problem:   HCAP (healthcare-associated pneumonia) Admit to stepdown/inpatient. Continue supplemental oxygen. Bronchodilators as needed. Gentle and time-limited IV fluids. Cefepime per pharmacy. Vancomycin per pharmacy. Follow-up blood culture and sensitivity. Check sputum Gram stain, culture and sensitivity. Check strep pneumoniae urinary antigen. Check Legionella pneumophila urinary antigen in the setting of hyponatremia.  Active Problems:   Elevated troponin Likely due to demand ischemia. Trend troponin level. Check echocardiogram.    Hyponatremia Hold furosemide. Time-limited and gentle IV hydration. Follow-up sodium level.    Mixed hyperlipidemia Has allergy to statins.    Essential hypertension, benign Continue atenolol 25 mg p.o. twice daily. Continue lisinopril 5 mg p.o. daily.    CAD, NATIVE VESSEL Continue aspirin, atenolol and lisinopril.    Carotid atherosclerosis Continue antiplatelet therapy.    Hypothyroidism Continue levothyroxine 150 mcg p.o. daily. Check TSH as needed.    GERD (gastroesophageal reflux disease) Protonix 20 mg p.o. twice daily. Famotidine 20 mg p.o. twice daily.    Dementia ([place])  Supportive care.     DVT prophylaxis: Lovenox SQ. Code Status: Full code. Family Communication:  Disposition Plan: Admit for IV antibiotics for 2 to 3 days. Consults called:  Admission status: Inpatient/telemetry.   Reubin Milan MD [facility]  09/11/2018, 11:14 PM    This document was prepared using Dragon voice recognition software and may contain some unintended transcription errors.

## 2018-09-11 NOTE — ED Notes (Signed)
This nurse spoke with pt sister, Ellamae Sia who reports she is POA and 1st contact for pt- reports pt went to Dr today per staff at [facility] for recheck on UTI and flu. Pt sister was unaware of pt being brought to hospital.   This nurse spoke with nurse at [facility] who reports pt has progressively gotten worse with increased confusion and weakness since being diagnosed with flu and UTI a week ago. Pt was started on another round of amoxicillin today after being seen by her doctor. Pt sister also reports family member went to visit pt a few days ago and says pt was more confused than normal. Dr Roderic Palau made aware of this.

## 2018-09-11 NOTE — ED Triage Notes (Addendum)
EMS called out for unconsciousness. Staff reported AMS and generalized weakness, inability to walk and normally ambulates with walker. Pt has hx of dementia and has been treated [facility] staff reported to EMS that pt ate dinner and thereafter symptoms started. Pt has recently been treated for flu and UTI, Tamiflu has been completed, ended on 14th and currently taking amoxicillin- 1st dose today, 1st round of antibiotics for UTI has been completed. Pt has mild labored respirations with accessory use, JVD, and increased respirations in triage. EMS reports after pt was placed on O2 she became more alert. Pt was 89% on RA when first responders arrived.

## 2018-09-11 NOTE — Progress Notes (Signed)
PHARMACY NOTE:  ANTIMICROBIAL RENAL DOSAGE ADJUSTMENT  Current antimicrobial regimen includes a mismatch between antimicrobial dosage and estimated renal function.  As per policy approved by the Pharmacy & Therapeutics and Medical Executive Committees, the antimicrobial dosage will be adjusted accordingly.  Current antimicrobial dosage:  Cefepime 1 gram Q8   Indication: HCAP  Renal Function:  Estimated Creatinine Clearance: 42.2 mL/min (by C-G formula based on SCr of 0.81 mg/dL). []      On intermittent HD, scheduled: []      On CRRT    Antimicrobial dosage has been changed to:  Cefepime 1 gram Q24  Additional comments:   Thank you for allowing pharmacy to be a part of this patient's care.  Nyra Capes, [facility] 09/11/2018 11:26 PM

## 2018-09-11 NOTE — ED Notes (Signed)
Blood cultures x 2 have been collected.

## 2018-09-11 NOTE — ED Provider Notes (Signed)
[facility] EMERGENCY DEPARTMENT Provider Note   CSN: [id number] Arrival date & time: 09/11/18  2026     History   Chief Complaint Chief Complaint  Patient presents with  . Altered Mental Status    generalized weakness    HPI Lisa Kemp is a 85 y.o. female.  Patient brought into the emergency department for worsening weakness.  She has been treated for flu and a UTI for a week but she also has a cough and now she is so weak she cannot get out of bed.  She has a history of dementia  The history is provided by the patient. No language interpreter was used.  Weakness  Severity:  Moderate Onset quality:  Gradual Duration:  5 days Timing:  Constant Progression:  Waxing and waning Chronicity:  New Context: not alcohol use   Relieved by:  Nothing Worsened by:  Nothing Ineffective treatments:  None tried Associated symptoms: no abdominal pain     Past Medical History:  Diagnosis Date  . Arthritis   . CHF (congestive heart failure) ([place])   . Colon polyps   . Coronary atherosclerosis of native coronary artery    Nonobstructive at catherization 2006  . Dementia ([place])   . Esophageal ring    Distal esophageal ring status post dilation  . Gastrointestinal bleed    Related to NSAIDs  . GERD (gastroesophageal reflux disease)   . Hiatal hernia    Moderate sized sliding hiatal hernia  . Hypertension   . Hypothyroidism   . Intracranial hemorrhage ([place])    Left subcortical 2015  . Mixed hyperlipidemia    Statin intolerant  . Neuropathy     Patient Active Problem List   Diagnosis Date Noted  . HCAP (healthcare-associated pneumonia) 09/11/2018  . Carotid atherosclerosis 06/11/2014  . Osteoarthritis of left knee 02/17/2014  . SDH (subdural hematoma) ([place]) 12/25/2013  . Intracerebral hemorrhage ([place]) 12/22/2013  . Essential hypertension, benign 08/01/2009  . Mixed hyperlipidemia 06/12/2009  . CAD, NATIVE VESSEL 06/12/2009    Past Surgical History:  Procedure  Laterality Date  . APPENDECTOMY    . CATARACT EXTRACTION    . COLON SURGERY    . TOTAL ABDOMINAL HYSTERECTOMY    . TOTAL ABDOMINAL HYSTERECTOMY W/ BILATERAL SALPINGOOPHORECTOMY       OB History   No obstetric history on file.      Home Medications    Prior to Admission medications   Medication Sig Start Date End Date Taking? Authorizing Provider  acetaminophen (TYLENOL) 500 MG tablet Take 500 mg by mouth every 8 (eight) hours as needed for mild pain or moderate pain.   Yes [provider]  amoxicillin-clavulanate (AUGMENTIN) 875-125 MG tablet Take 1 tablet by mouth 2 (two) times daily. 10 day course starting on 09/11/2018   Yes [provider]  aspirin 81 MG tablet Take 81 mg by mouth daily.   Yes [provider]  atenolol (TENORMIN) 25 MG tablet TAKE ONE TABLET BY MOUTH TWICE DAILY Patient taking differently: Take 25 mg by mouth 2 (two) times daily.  03/08/16  Yes Satira Sark, MD  calcium-vitamin D (OSCAL WITH D) 500-200 MG-UNIT per tablet Take 1 tablet by mouth 2 (two) times daily.     Yes [provider]  Cholecalciferol (VITAMIN D3) 2000 units TABS Take 1 tablet by mouth daily.   Yes [provider]  dextromethorphan-guaiFENesin (TUSSIN DM) 10-100 MG/5ML liquid Take 5 mLs by mouth every 6 (six) hours as needed for cough.  Yes [provider]  ferrous sulfate 325 (65 FE) MG tablet Take 325 mg by mouth daily.   Yes [provider]  furosemide (LASIX) 20 MG tablet TAKE ONE TABLET BY MOUTH DAILY Patient taking differently: Take 20 mg by mouth daily.  08/13/16  Yes Satira Sark, MD  gabapentin (NEURONTIN) 100 MG capsule Take 1 capsule (100 mg total) by mouth at bedtime. 01/01/14  Yes Angiulli, Lavon Paganini, PA-C  ibuprofen (ADVIL,MOTRIN) 200 MG tablet Take 200 mg by mouth every 6 (six) hours as needed for mild pain.   Yes [provider]  levothyroxine (SYNTHROID, LEVOTHROID) 150 MCG tablet Take 150 mcg by  mouth daily before breakfast.   Yes [provider]  lisinopril (PRINIVIL,ZESTRIL) 5 MG tablet Take 5 mg by mouth daily.   Yes [provider]  loperamide (IMODIUM A-D) 2 MG tablet Take 2 mg by mouth 4 (four) times daily as needed for diarrhea or loose stools.   Yes [provider]  LORazepam (ATIVAN) 1 MG tablet Take 0.5-1 mg by mouth See admin instructions. Take 0.5mg  by mouth daily in the evening. Take 1mg   At bedtime   Yes [provider]  nitroGLYCERIN (NITROSTAT) 0.4 MG SL tablet Place 1 tablet (0.4 mg total) under the tongue as directed. 06/19/16  Yes Satira Sark, MD  omeprazole (PRILOSEC) 40 MG capsule Take 40 mg by mouth daily.    Yes [provider]  ondansetron (ZOFRAN) 4 MG tablet TAKE 1 TABLET BY MOUTH TWICE DAILY AS NEEDED FOR NAUSEA. Patient taking differently: Take 4 mg by mouth 3 (three) times daily as needed for nausea or vomiting.  12/27/16  Yes Rehman, Mechele Dawley, MD  Oyster Shell (OYSTER CALCIUM) 500 MG TABS tablet Take 500 mg of elemental calcium by mouth 2 (two) times daily.    Yes [provider]  potassium chloride (K-DUR) 10 MEQ tablet TAKE 1 tab daily with lasix Patient taking differently: Take 10 mEq by mouth daily.  10/31/15  Yes Satira Sark, MD  ranitidine (ZANTAC) 150 MG tablet Take 150-300 mg by mouth 2 (two) times daily. 2 tablets in AM and 1 tablet in PM 01/01/14  Yes Angiulli, Lavon Paganini, PA-C  saccharomyces boulardii (FLORASTOR) 250 MG capsule Take 250 mg by mouth daily. 10 day course starting on 09/12/2018   Yes [provider]  simethicone (MYLICON) 80 MG chewable tablet Chew 80 mg by mouth 2 (two) times daily.   Yes [provider]  trolamine salicylate (ASPERCREME) 10 % cream Apply 1 application topically 2 (two) times daily as needed for muscle pain.   Yes [provider]  vitamin B-12 (CYANOCOBALAMIN) 500 MCG tablet Take 500 mcg by mouth daily.   Yes [provider]    oseltamivir (TAMIFLU) 75 MG capsule Take 75 mg by mouth 2 (two) times daily. 5 day course starting on 09/04/2018    [provider]    Family History Family History  Problem Relation Age of Onset  . Heart attack Mother   . Heart attack Father   . Hypertension Other     Social History Social History   Tobacco Use  . Smoking status: Never Smoker  . Smokeless tobacco: Never Used  Substance Use Topics  . Alcohol use: No    Alcohol/week: 0.0 standard drinks    Comment: 06/11/14 -- "Never tasted a beer, wine, or a whiskey.." - AJ  . Drug use: No     Allergies   Diazepam and  Statins   Review of Systems Review of Systems  Unable to perform ROS: Dementia  Gastrointestinal: Negative for abdominal pain.  Neurological: Positive for weakness.     Physical Exam Updated Vital Signs BP 125/70   Pulse 71   Resp (!) 22   Wt 72.1 kg   SpO2 99%   BMI 28.16 kg/m   Physical Exam Vitals signs and nursing note reviewed.  Constitutional:      Appearance: She is well-developed.     Comments: Lethargic  HENT:     Head: Normocephalic.     Nose: Nose normal.  Eyes:     General: No scleral icterus.    Conjunctiva/sclera: Conjunctivae normal.  Neck:     Musculoskeletal: Neck supple.     Thyroid: No thyromegaly.  Cardiovascular:     Rate and Rhythm: Normal rate and regular rhythm.     Heart sounds: No murmur. No friction rub. No gallop.   Pulmonary:     Breath sounds: No stridor. Rales present. No wheezing.  Chest:     Chest wall: No tenderness.  Abdominal:     General: There is no distension.     Tenderness: There is no abdominal tenderness. There is no rebound.  Musculoskeletal: Normal range of motion.  Lymphadenopathy:     Cervical: No cervical adenopathy.  Skin:    General: Skin is warm.     Findings: No erythema or rash.  Neurological:     Motor: No abnormal muscle tone.     Coordination: Coordination normal.     Comments: Oriented to person only   Psychiatric:        Behavior: Behavior normal.      ED Treatments / Results  Labs (all labs ordered are listed, but only abnormal results are displayed) Labs Reviewed  COMPREHENSIVE METABOLIC PANEL - Abnormal; Notable for the following components:      Result Value   Sodium 127 (*)    Chloride 86 (*)    Glucose, Bld 129 (*)    Albumin 3.0 (*)    All other components within normal limits  CBC WITH DIFFERENTIAL/PLATELET - Abnormal; Notable for the following components:   WBC 12.0 (*)    Neutro Abs 8.8 (*)    Abs Immature Granulocytes 0.65 (*)    All other components within normal limits  BRAIN NATRIURETIC PEPTIDE - Abnormal; Notable for the following components:   B Natriuretic Peptide 220.0 (*)    All other components within normal limits  TROPONIN I - Abnormal; Notable for the following components:   Troponin I 0.13 (*)    All other components within normal limits  CULTURE, BLOOD (ROUTINE X 2)  CULTURE, BLOOD (ROUTINE X 2)  LACTIC ACID, PLASMA  URINALYSIS, ROUTINE W REFLEX MICROSCOPIC  LACTIC ACID, PLASMA  I-STAT CG4 LACTIC ACID, ED  I-STAT CG4 LACTIC ACID, ED    EKG EKG Interpretation  Date/Time:  Thursday September 11 2018 20:57:10 EST Ventricular Rate:  74 PR Interval:    QRS Duration: 107 QT Interval:  438 QTC Calculation: 486 R Axis:   50 Text Interpretation:  Sinus rhythm Probable left atrial enlargement Borderline repol abnormality, diffuse leads Borderline prolonged QT interval Confirmed by Milton Ferguson [phone]) on 09/11/2018 10:49:28 PM   Radiology Dg Chest Portable 1 View  Result Date: 09/11/2018 CLINICAL DATA:  Shortness of breath, dementia, altered mental status, weakness EXAM: PORTABLE CHEST 1 VIEW COMPARISON:  11/17/2017 FINDINGS: Increased interstitial markings. Mild patchy left upper lobe opacity, suspicious for pneumonia.  Mild patchy right basilar opacity, atelectasis versus pneumonia. No definite pleural effusions. No pneumothorax. The heart is  top-normal in size.  Thoracic aortic atherosclerosis. IMPRESSION: Mild patchy left upper lobe opacity, suspicious for pneumonia. Mild right basilar opacity, atelectasis versus pneumonia. Electronically Signed   By: Julian Hy M.D.   On: 09/11/2018 21:32    Procedures Procedures (including critical care time)  Medications Ordered in ED Medications  sodium chloride 0.9 % bolus 250 mL (250 mLs Intravenous New Bag/Given 09/11/18 2257)  cefTRIAXone (ROCEPHIN) 1 g in sodium chloride 0.9 % 100 mL IVPB (0 g Intravenous Stopped 09/11/18 2235)     Initial Impression / Assessment and Plan / ED Course  I have reviewed the triage vital signs and the nursing notes.  Pertinent labs & imaging results that were available during my care of the patient were reviewed by me and considered in my medical decision making (see chart for details).    CRITICAL CARE Performed by: Milton Ferguson Total critical care time: 45 minutes Critical care time was exclusive of separately billable procedures and treating other patients. Critical care was necessary to treat or prevent imminent or life-threatening deterioration. Critical care was time spent personally by me on the following activities: development of treatment plan with patient and/or surrogate as well as nursing, discussions with consultants, evaluation of patient's response to treatment, examination of patient, obtaining history from patient or surrogate, ordering and performing treatments and interventions, ordering and review of laboratory studies, ordering and review of radiographic studies, pulse oximetry and re-evaluation of patient's condition.   Patient with possible pneumonia and possible failure.  She will be admitted to stepdown  Final Clinical Impressions(s) / ED Diagnoses   Final diagnoses:  Community acquired pneumonia of left upper lobe of lung [facility])    ED Discharge Orders    None       Milton Ferguson, MD 09/11/18 2301

## 2018-09-11 NOTE — ED Notes (Signed)
Date and time results received: 09/11/18 10:32 PM (use smartphrase ".now" to insert current time)  Test: troponin Critical Value: 0.13  Name of Provider Notified: Dr Roderic Palau  Orders Received? Or Actions Taken?: no new orders received.

## 2018-09-11 NOTE — ED Notes (Addendum)
All medications listed below have been given prior to pt transport to ED per med rec and MAR from [facility] facility.

## 2018-09-11 NOTE — ED Notes (Signed)
Lab at bedside for blood cultures.

## 2018-09-12 ENCOUNTER — Encounter (HOSPITAL_COMMUNITY): Payer: Self-pay | Admitting: *Deleted

## 2018-09-12 ENCOUNTER — Other Ambulatory Visit: Payer: Self-pay

## 2018-09-12 DIAGNOSIS — E871 Hypo-osmolality and hyponatremia: Secondary | ICD-10-CM

## 2018-09-12 DIAGNOSIS — E039 Hypothyroidism, unspecified: Secondary | ICD-10-CM

## 2018-09-12 DIAGNOSIS — I6523 Occlusion and stenosis of bilateral carotid arteries: Secondary | ICD-10-CM

## 2018-09-12 DIAGNOSIS — K219 Gastro-esophageal reflux disease without esophagitis: Secondary | ICD-10-CM

## 2018-09-12 DIAGNOSIS — F0390 Unspecified dementia without behavioral disturbance: Secondary | ICD-10-CM

## 2018-09-12 DIAGNOSIS — I10 Essential (primary) hypertension: Secondary | ICD-10-CM

## 2018-09-12 DIAGNOSIS — E782 Mixed hyperlipidemia: Secondary | ICD-10-CM

## 2018-09-12 LAB — STREP PNEUMONIAE URINARY ANTIGEN: Strep Pneumo Urinary Antigen: NEGATIVE

## 2018-09-12 LAB — URINALYSIS, ROUTINE W REFLEX MICROSCOPIC
Bacteria, UA: NONE SEEN
Bilirubin Urine: NEGATIVE
GLUCOSE, UA: NEGATIVE mg/dL
Ketones, ur: NEGATIVE mg/dL
Leukocytes, UA: NEGATIVE
Nitrite: NEGATIVE
PROTEIN: NEGATIVE mg/dL
Specific Gravity, Urine: 1.021 (ref 1.005–1.030)
pH: 7 (ref 5.0–8.0)

## 2018-09-12 LAB — TROPONIN I
Troponin I: 0.13 ng/mL (ref <0.03)
Troponin I: 0.09 ng/mL (ref <0.03)

## 2018-09-12 LAB — MRSA PCR SCREENING: MRSA by PCR: NEGATIVE

## 2018-09-12 MED ORDER — LORAZEPAM 1 MG PO TABS
1.0000 mg | ORAL_TABLET | Freq: Every day | ORAL | Status: DC
  Administered 2018-09-12 – 2018-09-14 (×3): 1 mg via ORAL
  Filled 2018-09-12 (×3): qty 1

## 2018-09-12 MED ORDER — VANCOMYCIN HCL IN DEXTROSE 1-5 GM/200ML-% IV SOLN
1000.0000 mg | INTRAVENOUS | Status: DC
  Administered 2018-09-12: 1000 mg via INTRAVENOUS
  Filled 2018-09-12: qty 200

## 2018-09-12 MED ORDER — POTASSIUM CHLORIDE IN NACL 20-0.9 MEQ/L-% IV SOLN
INTRAVENOUS | Status: AC
  Administered 2018-09-12: 06:00:00 via INTRAVENOUS

## 2018-09-12 MED ORDER — MUSCLE RUB 10-15 % EX CREA
TOPICAL_CREAM | Freq: Two times a day (BID) | CUTANEOUS | Status: DC | PRN
  Filled 2018-09-12: qty 85

## 2018-09-12 MED ORDER — HYDRALAZINE HCL 20 MG/ML IJ SOLN
10.0000 mg | Freq: Four times a day (QID) | INTRAMUSCULAR | Status: DC | PRN
  Administered 2018-09-12 – 2018-09-13 (×2): 10 mg via INTRAVENOUS
  Filled 2018-09-12 (×3): qty 1

## 2018-09-12 MED ORDER — MAGNESIUM SULFATE 2 GM/50ML IV SOLN
2.0000 g | Freq: Once | INTRAVENOUS | Status: AC
  Administered 2018-09-12: 2 g via INTRAVENOUS
  Filled 2018-09-12: qty 50

## 2018-09-12 MED ORDER — LORAZEPAM 0.5 MG PO TABS
0.5000 mg | ORAL_TABLET | Freq: Every evening | ORAL | Status: DC
  Administered 2018-09-12 – 2018-09-13 (×2): 0.5 mg via ORAL
  Filled 2018-09-12 (×3): qty 1

## 2018-09-12 MED ORDER — POTASSIUM CHLORIDE IN NACL 20-0.9 MEQ/L-% IV SOLN
INTRAVENOUS | Status: AC
  Administered 2018-09-12: 20:00:00 via INTRAVENOUS

## 2018-09-12 MED ORDER — GUAIFENESIN-DM 100-10 MG/5ML PO SYRP: 5.0000 mL | ORAL_SOLUTION | Freq: Four times a day (QID) | ORAL | Status: DC | PRN

## 2018-09-12 NOTE — Progress Notes (Signed)
PROGRESS NOTE    Lisa Kemp  VHQ:[id number] DOB: 1924/01/10 DOA: 09/11/2018 PCP: Glenda Chroman, MD    Brief Narrative:  85 year old female with a history of hypertension, hypothyroidism, hyperlipidemia, prior intracranial hemorrhage, dementia who is a resident of a skilled nursing facility, is brought to the hospital with increasing weakness and lethargy.  Found to have possible healthcare associated pneumonia as well as dehydration.  Started on IV fluids and antibiotics.   Assessment & Plan:   Principal Problem:   HCAP (healthcare-associated pneumonia) Active Problems:   Mixed hyperlipidemia   Essential hypertension, benign   CAD, NATIVE VESSEL   Carotid atherosclerosis   Hypothyroidism   GERD (gastroesophageal reflux disease)   Dementia (H[facility])   Elevated troponin   Hyponatremia   1. Healthcare associated pneumonia.  Discontinue vancomycin since MRSA isolated PCR is negative continue cefepime for now.  Follow-up urinary antigens. 2. Elevated troponin.  Echocardiogram has been ordered.  Suspect this is demand ischemia. 3. Hyponatremia.  Hold furosemide and continue hydration.  Follow-up labs in a.m. 4. Hyperlipidemia.  Patient has allergy to statins. 5. Hypertension.  On atenolol and lisinopril.  Last 2 blood pressure readings have been elevated, but prior to that they have been reasonable.  Continue to monitor with PRN hydralazine for now. 6. Coronary artery disease.  Continue on aspirin, atenolol and lisinopril. 7. GERD.  Continue on PPI 8. Dementia.  Stable   DVT prophylaxis: Lovenox Code Status: DNR, confirmed with family Family Communication: Discussed with sister over the phone who is POA Disposition Plan: Likely return to assisted living facility on discharge.   Consultants:     Procedures:     Antimicrobials:   Vancomycin 1/16 > 1/17  Cefepime 1/16 >   Subjective: Patient is confused.  Does not know why she is in the  hospital.  Objective: Vitals:   09/12/18 0540 09/12/18 0748 09/12/18 1456 09/12/18 1923  BP: (!) 118/41  (!) 191/78 (!) 191/74  Pulse: 68  74 79  Resp: 18  18   Temp: 97.9 F (36.6 C)  98.5 F (36.9 C)   TempSrc: Oral  Oral   SpO2: 90% 98% 94%   Weight: 67.5 kg     Height: 5\' 3"  (1.6 m)       Intake/Output Summary (Last 24 hours) at 09/12/2018 1931 Last data filed at 09/12/2018 1512 Gross per 24 hour  Intake 1327.02 ml  Output 0 ml  Net 1327.02 ml   Filed Weights   09/11/18 2042 09/12/18 0540  Weight: 72.1 kg 67.5 kg    Examination:  General exam: Appears calm and comfortable  Respiratory system: No rhonchi. Respiratory effort normal. Cardiovascular system: S1 & S2 heard, RRR. No JVD, murmurs, rubs, gallops or clicks. No pedal edema. Gastrointestinal system: Abdomen is nondistended, soft and nontender. No organomegaly or masses felt. Normal bowel sounds heard. Central nervous system: No focal neurological deficits. Extremities: Symmetric 5 x 5 power. Skin: No rashes, lesions or ulcers Psychiatry: Confused, pleasant    Data Reviewed: I have personally reviewed following labs and imaging studies  CBC: Recent Labs  Lab 09/11/18 2101  WBC 12.0*  NEUTROABS 8.8*  HGB 12.9  HCT 39.8  MCV 90.5  PLT 413   Basic Metabolic Panel: Recent Labs  Lab 09/11/18 2101  NA 127*  K 3.9  CL 86*  CO2 32  GLUCOSE 129*  BUN 14  CREATININE 0.81  CALCIUM 9.0  MG 1.8  PHOS 3.4   GFR: Estimated Creatinine Clearance: 40.9  mL/min (by C-G formula based on SCr of 0.81 mg/dL). Liver Function Tests: Recent Labs  Lab 09/11/18 2101  AST 26  ALT 20  ALKPHOS 50  BILITOT 0.6  PROT 6.5  ALBUMIN 3.0*   No results for input(s): LIPASE, AMYLASE in the last 168 hours. No results for input(s): AMMONIA in the last 168 hours. Coagulation Profile: No results for input(s): INR, PROTIME in the last 168 hours. Cardiac Enzymes: Recent Labs  Lab 09/11/18 2101 09/12/18 0318  09/12/18 0849  TROPONINI 0.13* 0.13* 0.09*   BNP (last 3 results) No results for input(s): PROBNP in the last 8760 hours. HbA1C: No results for input(s): HGBA1C in the last 72 hours. CBG: No results for input(s): GLUCAP in the last 168 hours. Lipid Profile: No results for input(s): CHOL, HDL, LDLCALC, TRIG, CHOLHDL, LDLDIRECT in the last 72 hours. Thyroid Function Tests: No results for input(s): TSH, T4TOTAL, FREET4, T3FREE, THYROIDAB in the last 72 hours. Anemia Panel: No results for input(s): VITAMINB12, FOLATE, FERRITIN, TIBC, IRON, RETICCTPCT in the last 72 hours. Sepsis Labs: Recent Labs  Lab 09/11/18 2101  LATICACIDVEN 1.3  1.36    Recent Results (from the past 240 hour(s))  Blood Culture (routine x 2)     Status: None (Preliminary result)   Collection Time: 09/11/18  9:21 PM  Result Value Ref Range Status   Specimen Description BLOOD LEFT HAND  Final   Special Requests   Final    BOTTLES DRAWN AEROBIC AND ANAEROBIC Blood Culture adequate volume   Culture   Final    NO GROWTH < 12 HOURS Performed at [facility], [address], [place], [place] [address]    Report Status PENDING  Incomplete  Blood Culture (routine x 2)     Status: None (Preliminary result)   Collection Time: 09/11/18  9:21 PM  Result Value Ref Range Status   Specimen Description BLOOD RIGHT HAND  Final   Special Requests   Final    BOTTLES DRAWN AEROBIC AND ANAEROBIC Blood Culture adequate volume   Culture   Final    NO GROWTH < 12 HOURS Performed at [facility], [address] [place], [place] [address]    Report Status PENDING  Incomplete  MRSA PCR Screening     Status: None   Collection Time: 09/12/18  5:56 AM  Result Value Ref Range Status   MRSA by PCR NEGATIVE NEGATIVE Final    Comment:        The GeneXpert MRSA Assay (FDA approved for NASAL specimens only), is one component of a comprehensive MRSA colonization surveillance program. It is not intended to diagnose MRSA infection  nor to guide or monitor treatment for MRSA infections. Performed at [facility], [address] [place],  [address]          Radiology Studies: Dg Chest Portable 1 View  Result Date: 09/11/2018 CLINICAL DATA:  Shortness of breath, dementia, altered mental status, weakness EXAM: PORTABLE CHEST 1 VIEW COMPARISON:  11/17/2017 FINDINGS: Increased interstitial markings. Mild patchy left upper lobe opacity, suspicious for pneumonia. Mild patchy right basilar opacity, atelectasis versus pneumonia. No definite pleural effusions. No pneumothorax. The heart is top-normal in size.  Thoracic aortic atherosclerosis. IMPRESSION: Mild patchy left upper lobe opacity, suspicious for pneumonia. Mild right basilar opacity, atelectasis versus pneumonia. Electronically Signed   By: Julian Hy M.D.   On: 09/11/2018 21:32        Scheduled Meds: . aspirin EC  81 mg Oral Daily  . atenolol  25 mg Oral BID  . enoxaparin (LOVENOX) injection  40 mg Subcutaneous Q24H  . famotidine  20 mg Oral BID  . gabapentin  100 mg Oral QHS  . levothyroxine  150 mcg Oral QAC breakfast  . lisinopril  5 mg Oral Daily  . LORazepam  0.5 mg Oral QPM  . LORazepam  1 mg Oral QHS  . nitroGLYCERIN  0.4 mg Sublingual UD  . pantoprazole  40 mg Oral Daily  . potassium chloride  10 mEq Oral Daily  . saccharomyces boulardii  250 mg Oral Daily  . simethicone  80 mg Oral BID   Continuous Infusions: . ceFEPime (MAXIPIME) IV Stopped (09/12/18 0105)  . vancomycin 1,000 mg (09/12/18 1756)     LOS: 1 day    Time spent: 12mins    Kathie Dike, MD [facility]   If 7PM-7AM, please contact night-coverage [email or web address]  09/12/2018, 7:31 PM

## 2018-09-12 NOTE — Progress Notes (Signed)
ANTIBIOTIC CONSULT NOTE-Preliminary  Pharmacy Consult for vancomycin Indication: pneumonia  Allergies  Allergen Reactions  . Diazepam Other (See Comments)    Could not walk  . Statins Other (See Comments)    CRAMPING    Patient Measurements: Weight: 158 lb 15.2 oz (72.1 kg) Adjusted Body Weight:   Vital Signs: BP: 151/61 (01/17 0005) Pulse Rate: 69 (01/17 0005)  Labs: Recent Labs    09/11/18 2101  WBC 12.0*  HGB 12.9  PLT 288  CREATININE 0.81    Estimated Creatinine Clearance: 42.2 mL/min (by C-G formula based on SCr of 0.81 mg/dL).  No results for input(s): VANCOTROUGH, VANCOPEAK, VANCORANDOM, GENTTROUGH, GENTPEAK, GENTRANDOM, TOBRATROUGH, TOBRAPEAK, TOBRARND, AMIKACINPEAK, AMIKACINTROU, AMIKACIN in the last 72 hours.   Microbiology: Recent Results (from the past 720 hour(s))  Blood Culture (routine x 2)     Status: None (Preliminary result)   Collection Time: 09/11/18  9:21 PM  Result Value Ref Range Status   Specimen Description BLOOD LEFT HAND  Final   Special Requests   Final    BOTTLES DRAWN AEROBIC AND ANAEROBIC Blood Culture adequate volume Performed at [facility], [address], [place], [place] [address]    Culture PENDING  Incomplete   Report Status PENDING  Incomplete  Blood Culture (routine x 2)     Status: None (Preliminary result)   Collection Time: 09/11/18  9:21 PM  Result Value Ref Range Status   Specimen Description BLOOD RIGHT HAND  Final   Special Requests   Final    BOTTLES DRAWN AEROBIC AND ANAEROBIC Blood Culture adequate volume Performed at [facility], [address] [place], [place] [address]    Culture PENDING  Incomplete   Report Status PENDING  Incomplete    Medical History: Past Medical History:  Diagnosis Date  . Arthritis   . CHF (congestive heart failure) ([place])   . Colon polyps   . Coronary atherosclerosis of native coronary artery    Nonobstructive at catherization 2006  . Dementia ([place])   . Esophageal ring    Distal esophageal ring status post dilation  . Gastrointestinal bleed    Related to NSAIDs  . GERD (gastroesophageal reflux disease)   . Hiatal hernia    Moderate sized sliding hiatal hernia  . Hypertension   . Hypothyroidism   . Intracranial hemorrhage ([place])    Left subcortical 2015  . Mixed hyperlipidemia    Statin intolerant  . Neuropathy     Medications:  Scheduled:  . aspirin EC  81 mg Oral Daily  . atenolol  25 mg Oral BID  . enoxaparin (LOVENOX) injection  40 mg Subcutaneous Q24H  . famotidine  20 mg Oral BID  . furosemide  20 mg Oral Daily  . gabapentin  100 mg Oral QHS  . levothyroxine  150 mcg Oral QAC breakfast  . lisinopril  5 mg Oral Daily  . LORazepam  0.5-1 mg Oral See admin instructions  . nitroGLYCERIN  0.4 mg Sublingual UD  . pantoprazole  40 mg Oral Daily  . potassium chloride  10 mEq Oral Daily  . saccharomyces boulardii  250 mg Oral Daily  . simethicone  80 mg Oral BID   Infusions:  . ceFEPime (MAXIPIME) IV 1 g (09/12/18 0009)  . vancomycin      Assessment: 85 yo female presented to ED for worsening weakness.  Starting vancomycin for pneumonia.  Has been treated for flu and UTI in last week.   Goal of Therapy:  Vancomycin trough level  15-20 mcg/ml  Plan:  Preliminary review of pertinent patient information completed.  Protocol will be initiated with dose(s) of vancomycin 1 gram.  Forestine Na clinical pharmacist will complete review during morning rounds to assess patient and finalize treatment regimen if needed.  Nyra Capes, RPH 09/12/2018,12:43 AM

## 2018-09-12 NOTE — Clinical Social Work Note (Signed)
Clinical Social Work Assessment  Patient Details  Name: Lisa Kemp MRN: [id number] Date of Birth: 03/02/1924  Date of referral:  09/12/18               Reason for consult:  Other (Comment Required)(From [facility])                Permission sought to share information with:    Permission granted to share information::     Name::        Agency::  [facility]  Relationship::     Contact Information:  Sister, Delman Kitten, listed on chart.   Housing/Transportation Living arrangements for the past 2 months:  [place] of Information:  Facility, Siblings Patient Interpreter Needed:  None Criminal Activity/Legal Involvement Pertinent to Current Situation/Hospitalization:  No - Comment as needed Significant Relationships:  Siblings, Other Family Members Lives with:  Facility Resident Do you feel safe going back to the place where you live?  Yes Need for family participation in patient care:  Yes (Comment)  Care giving concerns:  No concerns, facility resident.   Social Worker assessment / plan:  Per [facility], patient has been at the facility since 07/2016. She is pleasant. Patient receives assistance from staff with bathing, dressing and toileting.  Patient ambulates with a walker and experiences some incontinences. Patient is on a regular diet. She was flu positive a week ago.  Patient has a supportive sister.  She stated that patient can return at discharge.  Patient's sister, Delman Kitten, confirmed statements. She stated patient can return at discharge. She verbalized displeasure at the facility for not contacting her when patient went to the hospital. She stated that she was not aware that patient had not been taken to the hospital until she was notified by the hospital.    Employment status:  Retired Nurse, adult PT Recommendations:  Not assessed at this time Information / Referral to community resources:      Patient/Family's Response to care:  Patient's family is agreeable for her to return to [facility].   Patient/Family's Understanding of and Emotional Response to Diagnosis, Current Treatment, and Prognosis:  Patient and family understand patient's diagnosis, treatment and prognosis and feel that she should discharge to [facility].   Emotional Assessment Appearance:  Appears stated age Attitude/Demeanor/Rapport:    Affect (typically observed):  Unable to Assess Orientation:    Alcohol / Substance use:  Not Applicable Psych involvement (Current and /or in the community):  No (Comment)  Discharge Needs  Concerns to be addressed:    Readmission within the last 30 days:  No Current discharge risk:  None Barriers to Discharge:  No Barriers Identified   Ihor Gully, LCSW 09/12/2018, 2:39 PM

## 2018-09-12 NOTE — Progress Notes (Addendum)
Pharmacy Antibiotic Note  Lisa Kemp is a 85 y.o. female admitted on 09/11/2018 with pneumonia.  Pharmacy has been consulted for vancomycin dosing.  Patient received vancomycin 1g and ceftriaxone 1g in the ED last night. She is also on cefepime.  Plan:  Start vancomycin 1g IV q24h at 1600 today  Goal vancomycin  trough range:  15-20  mcg/mL Pharmacy will continue to monitor renal function, vancomycin troughs as clinically indicated, cultures and patient progress.    Height: 5\' 3"  (160 cm) Weight: 148 lb 13 oz (67.5 kg) IBW/kg (Calculated) : 52.4  Temp (24hrs), Avg:97.9 F (36.6 C), Min:97.9 F (36.6 C), Max:97.9 F (36.6 C)  Recent Labs  Lab 09/11/18 2101  WBC 12.0*  CREATININE 0.81  LATICACIDVEN 1.3  1.36    Estimated Creatinine Clearance: 40.9 mL/min (by C-G formula based on SCr of 0.81 mg/dL).    Allergies  Allergen Reactions  . Diazepam Other (See Comments)    Could not walk  . Statins Other (See Comments)    CRAMPING    Antimicrobials this admission: Cefepime 1/16 >>   Ceftriaxone 1/16 >>  Vancomycin 1/16 >>   Microbiology results: 1/16 BC x2:  NG <12h   1/16 MRSA PCR:  negative  Thank you for allowing pharmacy to be a part of this patient's care.  Despina Pole 09/12/2018 9:19 AM

## 2018-09-12 NOTE — Progress Notes (Signed)
Contacted [facility] for admission questions.  Facility unable to answer questions regarding if she had a living will or health care power of attorney.

## 2018-09-13 LAB — CBC
HCT: 40.6 % (ref 36.0–46.0)
Hemoglobin: 12.7 g/dL (ref 12.0–15.0)
MCH: 29.6 pg (ref 26.0–34.0)
MCHC: 31.3 g/dL (ref 30.0–36.0)
MCV: 94.6 fL (ref 80.0–100.0)
NRBC: 0 % (ref 0.0–0.2)
Platelets: 303 10*3/uL (ref 150–400)
RBC: 4.29 MIL/uL (ref 3.87–5.11)
RDW: 13.1 % (ref 11.5–15.5)
WBC: 7.4 10*3/uL (ref 4.0–10.5)

## 2018-09-13 LAB — BASIC METABOLIC PANEL
Anion gap: 8 (ref 5–15)
BUN: 9 mg/dL (ref 8–23)
CO2: 27 mmol/L (ref 22–32)
Calcium: 8.4 mg/dL — ABNORMAL LOW (ref 8.9–10.3)
Chloride: 96 mmol/L — ABNORMAL LOW (ref 98–111)
Creatinine, Ser: 0.62 mg/dL (ref 0.44–1.00)
GFR calc Af Amer: >60 mL/min (ref >60)
GFR calc non Af Amer: >60 mL/min (ref >60)
Glucose, Bld: 88 mg/dL (ref 70–99)
Potassium: 4.2 mmol/L (ref 3.5–5.1)
Sodium: 131 mmol/L — ABNORMAL LOW (ref 135–145)

## 2018-09-13 LAB — LEGIONELLA PNEUMOPHILA SEROGP 1 UR AG: L. PNEUMOPHILA SEROGP 1 UR AG: NEGATIVE

## 2018-09-13 MED ORDER — AMOXICILLIN-POT CLAVULANATE 875-125 MG PO TABS
1.0000 | ORAL_TABLET | Freq: Two times a day (BID) | ORAL | Status: DC
  Administered 2018-09-13 – 2018-09-14 (×3): 1 via ORAL
  Filled 2018-09-13 (×4): qty 1

## 2018-09-13 NOTE — Care Management (Deleted)
SATURATION QUALIFICATIONS: (This note is used to comply with regulatory documentation for home oxygen)  Patient Saturations on Room Air at Rest =94 %  Patient Saturations on Room Air while Ambulating = 78%  Patient Saturations on 2 Liters of oxygen while Ambulating = 92%  Sats provided by PT and primary RN.  Please briefly explain why patient needs home oxygen:

## 2018-09-13 NOTE — Evaluation (Signed)
Physical Therapy Evaluation Patient Details Name: Lisa Kemp MRN: [id number] DOB: [birth year] Today's Date: 09/13/2018   History of Present Illness  Lisa Kemp is a 85 y.o. female with medical history significant of osteoarthritis, diastolic CHF, colon polyps, gastrointestinal bleed, GERD, hiatal hernia, hypertension, hypothyroidism, history of intracranial hemorrhage, mixed hyperlipidemia, peripheral neuropathy who is brought from her facility [facility]) due to generalized weakness and AMS.  She is normally able to walk with a walker, but has been unable to ambulate with a due to generalized weakness.  The [facility] staff stated that she was recently treated for influenza and is currently being treated with amoxicillin for an UTI.No further history is available.    Clinical Impression  Patient limited for functional mobility as stated below secondary to BLE weakness, fatiguing of arms while leaning on RW and fair/poor standing balance.  Patient's O2 desaturated to between 78-84% while on room air during gait training, after walking and sitting in chair O2 saturation increased to 95-97% while on room air - RN/MD notified.  Patient will benefit from continued physical therapy in hospital and recommended venue below to increase strength, balance, endurance for safe ADLs and gait.     Follow Up Recommendations Home health PT;Supervision for mobility/OOB;Supervision - Intermittent    Equipment Recommendations  None recommended by PT    Recommendations for Other Services       Precautions / Restrictions Precautions Precautions: Fall Restrictions Weight Bearing Restrictions: No      Mobility  Bed Mobility Overal bed mobility: Modified Independent             General bed mobility comments: increased time  Transfers Overall transfer level: Needs assistance Equipment used: Rolling walker (2 wheeled) Transfers: Sit to/from [facility] Sit to Stand:  Min guard Stand pivot transfers: Min guard       General transfer comment: slow labored movement  Ambulation/Gait Ambulation/Gait assistance: Min assist Gait Distance (Feet): 40 Feet   Gait Pattern/deviations: Decreased step length - right;Decreased step length - left;Decreased stride length Gait velocity: decreased   General Gait Details: slow slightly labored cadence without loss of balance, slows cadence even more once fatigued with c/o of arms getting tired, on room air with O2 saturation dropping below 80% while walking  Stairs            Wheelchair Mobility    Modified Rankin (Stroke Patients Only)       Balance Overall balance assessment: Needs assistance Sitting-balance support: Feet supported;No upper extremity supported Sitting balance-Leahy Scale: Good     Standing balance support: Bilateral upper extremity supported;During functional activity Standing balance-Leahy Scale: Fair Standing balance comment: using RW                             Pertinent Vitals/Pain Pain Assessment: No/denies pain    Home Living Family/patient expects to be discharged to:: Assisted living               Home Equipment: Walker - 4 wheels      Prior Function Level of Independence: Needs assistance   Gait / Transfers Assistance Needed: household ambulator with Rollator  ADL's / Homemaking Assistance Needed: assisted by ALF staff        Hand Dominance        Extremity/Trunk Assessment   Upper Extremity Assessment Upper Extremity Assessment: Generalized weakness    Lower Extremity Assessment Lower Extremity Assessment: Generalized weakness  Cervical / Trunk Assessment Cervical / Trunk Assessment: Normal  Communication   Communication: No difficulties  Cognition Arousal/Alertness: Awake/alert Behavior During Therapy: WFL for tasks assessed/performed Overall Cognitive Status: Within Functional Limits for tasks assessed                                         General Comments      Exercises     Assessment/Plan    PT Assessment Patient needs continued PT services  PT Problem List Decreased strength;Decreased activity tolerance;Decreased balance;Decreased mobility       PT Treatment Interventions Gait training;Stair training;Functional mobility training;Therapeutic activities;Patient/family education;Therapeutic exercise    PT Goals (Current goals can be found in the Care Plan section)  Acute Rehab PT Goals Patient Stated Goal: return to ALF PT Goal Formulation: With patient Time For Goal Achievement: 09/20/18 Potential to Achieve Goals: Good    Frequency Min 3X/week   Barriers to discharge        Co-evaluation               AM-PAC PT "6 Clicks" Mobility  Outcome Measure Help needed turning from your back to your side while in a flat bed without using bedrails?: None Help needed moving from lying on your back to sitting on the side of a flat bed without using bedrails?: None Help needed moving to and from a bed to a chair (including a wheelchair)?: A Little Help needed standing up from a chair using your arms (e.g., wheelchair or bedside chair)?: A Little Help needed to walk in hospital room?: A Little Help needed climbing 3-5 steps with a railing? : A Lot 6 Click Score: 19    End of Session   Activity Tolerance: Patient tolerated treatment well;Patient limited by fatigue Patient left: in chair;with call bell/phone within reach Nurse Communication: Mobility status PT Visit Diagnosis: Unsteadiness on feet (R26.81);Other abnormalities of gait and mobility (R26.89)    Time: 0263-7858 PT Time Calculation (min) (ACUTE ONLY): 28 min   Charges:   PT Evaluation $PT Eval Moderate Complexity: 1 Mod PT Treatments $Gait Training: 23-37 mins        12:00 PM, 09/13/18 Lonell Grandchild, MPT Physical Therapist with [facility] [phone] office [phone] mobile  phone

## 2018-09-13 NOTE — Care Management (Addendum)
SATURATION QUALIFICATIONS: (This note is used to comply with regulatory documentation for home oxygen)  Patient Saturations on Room Air at Rest =94 %  Patient Saturations on Room Air while Ambulating = 84%  Patient Saturations on 2 Liters of oxygen while Ambulating = 92%  Sats provided by PT and primary RN.  Please briefly explain why patient needs home oxygen:

## 2018-09-13 NOTE — Plan of Care (Signed)
    Problem: Acute Rehab PT Goals(only PT should resolve) Goal: Patient Will Transfer Sit To/From Stand Outcome: Progressing [place] (Taken 09/13/2018 1201) Patient will transfer sit to/from stand: with supervision Goal: Pt Will Transfer Bed To Chair/Chair To Bed Outcome: Progressing [place] (Taken 09/13/2018 1201) Pt will Transfer Bed to Chair/Chair to Bed: with supervision Goal: Pt Will Ambulate Outcome: Progressing Flowshe[place] (Taken 09/13/2018 1201) Pt will Ambulate: with min guard assist; with rolling walker; 50 feet   12:02 PM, 09/13/18 Lonell Grandchild, MPT Physical Therapist with [facility] [phone] office [phone] mobile phone

## 2018-09-13 NOTE — Progress Notes (Signed)
SATURATION QUALIFICATIONS: (This note is used to comply with regulatory documentation for home oxygen)  Patient Saturations on Room Air at Rest = 92%  Patient Saturations on Room Air while Ambulating = 84%  Patient Saturations on 2 Liters of oxygen while Ambulating =95 %  Please briefly explain why patient needs home oxygen: Patient quickly de-sats below 88% while ambulating on room air.  Patient requires supplemental  oxygen to maintain o2 sats above 88% while ambulating.

## 2018-09-13 NOTE — Progress Notes (Signed)
PROGRESS NOTE    Lisa Kemp  YOV:[id number] DOB: 1924-04-24 DOA: 09/11/2018 PCP: Glenda Chroman, MD    Brief Narrative:  85 year old female with a history of hypertension, hypothyroidism, hyperlipidemia, prior intracranial hemorrhage, dementia who is a resident of a skilled nursing facility, is brought to the hospital with increasing weakness and lethargy.  Found to have possible healthcare associated pneumonia as well as dehydration.  Started on IV fluids and antibiotics.   Assessment & Plan:   Principal Problem:   HCAP (healthcare-associated pneumonia) Active Problems:   Mixed hyperlipidemia   Essential hypertension, benign   CAD, NATIVE VESSEL   Carotid atherosclerosis   Hypothyroidism   GERD (gastroesophageal reflux disease)   Dementia (HCC)   Elevated troponin   Hyponatremia   1. Healthcare associated pneumonia.  Initially was on vancomycin and cefepime.  Will de-escalate to Augmentin.  Clinically she is better. 2. Acute respiratory failure with hypoxia.  Related to pneumonia.  Weaning down oxygen as tolerated.  May need to discharge with supplemental oxygen. 3. Elevated troponin.  Suspect this is demand ischemia.  No complaints of chest pain. 4. Hyponatremia.  Hold furosemide and continue hydration.  Follow-up labs show sodium is improving 5. Hyperlipidemia.  Patient has allergy to statins. 6. Hypertension.  Continue on atenolol and lisinopril. 7. Coronary artery disease.  Continue on aspirin, atenolol and lisinopril. 8. GERD.  Continue on PPI 9. Dementia.  Stable   DVT prophylaxis: Lovenox Code Status: DNR, confirmed with family Family Communication: Discussed with sister over the phone who is POA Disposition Plan: Return to assisted living facility today if supplemental oxygen can be arranged within a reasonable timeframe, otherwise will likely be in a.m.   Consultants:     Procedures:     Antimicrobials:   Vancomycin 1/16 > 1/17  Cefepime 1/16  > 1/18  Augmentin 1/18   Subjective: Patient continues to have cough, remains mildly confused  Objective: Vitals:   09/13/18 0531 09/13/18 1309 09/13/18 1311 09/13/18 1332  BP: (!) 152/69 (!) 175/76 (!) 190/77 (!) 158/80  Pulse:  80    Resp:  16    Temp:  98.7 F (37.1 C)    TempSrc:  Oral    SpO2:  95%    Weight:      Height:        Intake/Output Summary (Last 24 hours) at 09/13/2018 1810 Last data filed at 09/13/2018 1529 Gross per 24 hour  Intake 1453.35 ml  Output 1200 ml  Net 253.35 ml   Filed Weights   09/11/18 2042 09/12/18 0540  Weight: 72.1 kg 67.5 kg    Examination:  General exam: Alert, awake, no distress Respiratory system: Scattered rhonchi. Respiratory effort normal. Cardiovascular system:RRR. No murmurs, rubs, gallops. Gastrointestinal system: Abdomen is nondistended, soft and nontender. No organomegaly or masses felt. Normal bowel sounds heard. Central nervous system: No focal neurological deficits. Extremities: No C/C/E, +pedal pulses Skin: No rashes, lesions or ulcers Psychiatry: Confused, pleasant     Data Reviewed: I have personally reviewed following labs and imaging studies  CBC: Recent Labs  Lab 09/11/18 2101 09/13/18 0637  WBC 12.0* 7.4  NEUTROABS 8.8*  --   HGB 12.9 12.7  HCT 39.8 40.6  MCV 90.5 94.6  PLT 288 741   Basic Metabolic Panel: Recent Labs  Lab 09/11/18 2101 09/13/18 0637  NA 127* 131*  K 3.9 4.2  CL 86* 96*  CO2 32 27  GLUCOSE 129* 88  BUN 14 9  CREATININE 0.81 0.62  CALCIUM 9.0 8.4*  MG 1.8  --   PHOS 3.4  --    GFR: Estimated Creatinine Clearance: 41.4 mL/min (by C-G formula based on SCr of 0.62 mg/dL). Liver Function Tests: Recent Labs  Lab 09/11/18 2101  AST 26  ALT 20  ALKPHOS 50  BILITOT 0.6  PROT 6.5  ALBUMIN 3.0*   No results for input(s): LIPASE, AMYLASE in the last 168 hours. No results for input(s): AMMONIA in the last 168 hours. Coagulation Profile: No results for input(s): INR,  PROTIME in the last 168 hours. Cardiac Enzymes: Recent Labs  Lab 09/11/18 2101 09/12/18 0318 09/12/18 0849  TROPONINI 0.13* 0.13* 0.09*   BNP (last 3 results) No results for input(s): PROBNP in the last 8760 hours. HbA1C: No results for input(s): HGBA1C in the last 72 hours. CBG: No results for input(s): GLUCAP in the last 168 hours. Lipid Profile: No results for input(s): CHOL, HDL, LDLCALC, TRIG, CHOLHDL, LDLDIRECT in the last 72 hours. Thyroid Function Tests: No results for input(s): TSH, T4TOTAL, FREET4, T3FREE, THYROIDAB in the last 72 hours. Anemia Panel: No results for input(s): VITAMINB12, FOLATE, FERRITIN, TIBC, IRON, RETICCTPCT in the last 72 hours. Sepsis Labs: Recent Labs  Lab 09/11/18 2101  LATICACIDVEN 1.3  1.36    Recent Results (from the past 240 hour(s))  Blood Culture (routine x 2)     Status: None (Preliminary result)   Collection Time: 09/11/18  9:21 PM  Result Value Ref Range Status   Specimen Description BLOOD LEFT HAND  Final   Special Requests   Final    BOTTLES DRAWN AEROBIC AND ANAEROBIC Blood Culture adequate volume   Culture   Final    NO GROWTH 2 DAYS Performed at [facility], [address] [place], [place] [address]    Report Status PENDING  Incomplete  Blood Culture (routine x 2)     Status: None (Preliminary result)   Collection Time: 09/11/18  9:21 PM  Result Value Ref Range Status   Specimen Description BLOOD RIGHT HAND  Final   Special Requests   Final    BOTTLES DRAWN AEROBIC AND ANAEROBIC Blood Culture adequate volume   Culture   Final    NO GROWTH 2 DAYS Performed at [facility], [address] [place], [place] [address]    Report Status PENDING  Incomplete  MRSA PCR Screening     Status: None   Collection Time: 09/12/18  5:56 AM  Result Value Ref Range Status   MRSA by PCR NEGATIVE NEGATIVE Final    Comment:        The GeneXpert MRSA Assay (FDA approved for NASAL specimens only), is one component of a comprehensive  MRSA colonization surveillance program. It is not intended to diagnose MRSA infection nor to guide or monitor treatment for MRSA infections. Performed at [facility], [address], [place], [place] [address]          Radiology Studies: Dg Chest Portable 1 View  Result Date: 09/11/2018 CLINICAL DATA:  Shortness of breath, dementia, altered mental status, weakness EXAM: PORTABLE CHEST 1 VIEW COMPARISON:  11/17/2017 FINDINGS: Increased interstitial markings. Mild patchy left upper lobe opacity, suspicious for pneumonia. Mild patchy right basilar opacity, atelectasis versus pneumonia. No definite pleural effusions. No pneumothorax. The heart is top-normal in size.  Thoracic aortic atherosclerosis. IMPRESSION: Mild patchy left upper lobe opacity, suspicious for pneumonia. Mild right basilar opacity, atelectasis versus pneumonia. Electronically Signed   By: Julian Hy M.D.   On: 09/11/2018 21:32  Scheduled Meds: . aspirin EC  81 mg Oral Daily  . atenolol  25 mg Oral BID  . enoxaparin (LOVENOX) injection  40 mg Subcutaneous Q24H  . famotidine  20 mg Oral BID  . gabapentin  100 mg Oral QHS  . levothyroxine  150 mcg Oral QAC breakfast  . lisinopril  5 mg Oral Daily  . LORazepam  0.5 mg Oral QPM  . LORazepam  1 mg Oral QHS  . nitroGLYCERIN  0.4 mg Sublingual UD  . pantoprazole  40 mg Oral Daily  . potassium chloride  10 mEq Oral Daily  . saccharomyces boulardii  250 mg Oral Daily  . simethicone  80 mg Oral BID   Continuous Infusions: . ceFEPime (MAXIPIME) IV Stopped (09/12/18 2315)     LOS: 2 days    Time spent: 50mins    Kathie Dike, MD [facility]   If 7PM-7AM, please contact night-coverage [email or web address]  09/13/2018, 6:10 PM

## 2018-09-14 MED ORDER — LISINOPRIL 10 MG PO TABS
10.0000 mg | ORAL_TABLET | Freq: Every day | ORAL | Status: DC
  Filled 2018-09-14: qty 1

## 2018-09-14 MED ORDER — ACETAMINOPHEN 325 MG PO TABS
650.0000 mg | ORAL_TABLET | Freq: Four times a day (QID) | ORAL | Status: DC | PRN
  Administered 2018-09-14 – 2018-09-15 (×2): 650 mg via ORAL
  Filled 2018-09-14 (×2): qty 2

## 2018-09-14 NOTE — Progress Notes (Signed)
PROGRESS NOTE    Lisa Kemp  ZYY:[id number] DOB: 04/03/1924 DOA: 09/11/2018 PCP: Glenda Chroman, MD    Brief Narrative:  85 year old female with a history of hypertension, hypothyroidism, hyperlipidemia, prior intracranial hemorrhage, dementia who is a resident of a skilled nursing facility, is brought to the hospital with increasing weakness and lethargy.  Found to have possible healthcare associated pneumonia as well as dehydration.  Started on IV fluids and antibiotics.   Assessment & Plan:   Principal Problem:   HCAP (healthcare-associated pneumonia) Active Problems:   Mixed hyperlipidemia   Essential hypertension, benign   CAD, NATIVE VESSEL   Carotid atherosclerosis   Hypothyroidism   GERD (gastroesophageal reflux disease)   Dementia (HCC)   Elevated troponin   Hyponatremia   1. Healthcare associated pneumonia.  Initially was on vancomycin and cefepime.  She has since been de-escalated to Augmentin.  Clinically she is better. 2. Acute respiratory failure with hypoxia.  Related to pneumonia.  Weaning down oxygen as tolerated.  May need to discharge with supplemental oxygen. 3. Elevated troponin.  Suspect this is demand ischemia.  No complaints of chest pain. 4. Hyponatremia.  Hold furosemide and continue hydration.  Follow-up labs show sodium is improving 5. Hyperlipidemia.  Patient has allergy to statins. 6. Hypertension.  Continue on atenolol and lisinopril. 7. Coronary artery disease.  Continue on aspirin, atenolol and lisinopril. 8. GERD.  Continue on PPI 9. Dementia.  Stable   DVT prophylaxis: Lovenox Code Status: DNR, confirmed with family Family Communication: Discussed with sister over the phone who is POA Disposition Plan: Patient has been stable for return to assisted living facility.  Unfortunately they cannot take her back until 1/20   Consultants:     Procedures:     Antimicrobials:   Vancomycin 1/16 > 1/17  Cefepime 1/16 >  1/18  Augmentin 1/18>   Subjective: Denies any cough or shortness of breath.  Feeling better.  Objective: Vitals:   09/13/18 1332 09/13/18 2128 09/13/18 2250 09/14/18 0500  BP: (!) 158/80 (!) 186/79 (!) 154/64 (!) 169/69  Pulse:  76 74 69  Resp:  18    Temp:  98.7 F (37.1 C)  98.1 F (36.7 C)  TempSrc:  Oral  Oral  SpO2:  93% 97% 94%  Weight:      Height:        Intake/Output Summary (Last 24 hours) at 09/14/2018 1323 Last data filed at 09/14/2018 0500 Gross per 24 hour  Intake -  Output 1150 ml  Net -1150 ml   Filed Weights   09/11/18 2042 09/12/18 0540  Weight: 72.1 kg 67.5 kg    Examination:  General exam: Alert, awake, no distress Respiratory system: Clear to auscultation. Respiratory effort normal. Cardiovascular system:RRR. No murmurs, rubs, gallops. Gastrointestinal system: Abdomen is nondistended, soft and nontender. No organomegaly or masses felt. Normal bowel sounds heard. Central nervous system: Alert and oriented. No focal neurological deficits. Extremities: No C/C/E, +pedal pulses Skin: No rashes, lesions or ulcers Psychiatry: Confused, pleasant  Data Reviewed: I have personally reviewed following labs and imaging studies  CBC: Recent Labs  Lab 09/11/18 2101 09/13/18 0637  WBC 12.0* 7.4  NEUTROABS 8.8*  --   HGB 12.9 12.7  HCT 39.8 40.6  MCV 90.5 94.6  PLT 288 488   Basic Metabolic Panel: Recent Labs  Lab 09/11/18 2101 09/13/18 0637  NA 127* 131*  K 3.9 4.2  CL 86* 96*  CO2 32 27  GLUCOSE 129* 88  BUN 14  9  CREATININE 0.81 0.62  CALCIUM 9.0 8.4*  MG 1.8  --   PHOS 3.4  --    GFR: Estimated Creatinine Clearance: 41.4 mL/min (by C-G formula based on SCr of 0.62 mg/dL). Liver Function Tests: Recent Labs  Lab 09/11/18 2101  AST 26  ALT 20  ALKPHOS 50  BILITOT 0.6  PROT 6.5  ALBUMIN 3.0*   No results for input(s): LIPASE, AMYLASE in the last 168 hours. No results for input(s): AMMONIA in the last 168 hours. Coagulation  Profile: No results for input(s): INR, PROTIME in the last 168 hours. Cardiac Enzymes: Recent Labs  Lab 09/11/18 2101 09/12/18 0318 09/12/18 0849  TROPONINI 0.13* 0.13* 0.09*   BNP (last 3 results) No results for input(s): PROBNP in the last 8760 hours. HbA1C: No results for input(s): HGBA1C in the last 72 hours. CBG: No results for input(s): GLUCAP in the last 168 hours. Lipid Profile: No results for input(s): CHOL, HDL, LDLCALC, TRIG, CHOLHDL, LDLDIRECT in the last 72 hours. Thyroid Function Tests: No results for input(s): TSH, T4TOTAL, FREET4, T3FREE, THYROIDAB in the last 72 hours. Anemia Panel: No results for input(s): VITAMINB12, FOLATE, FERRITIN, TIBC, IRON, RETICCTPCT in the last 72 hours. Sepsis Labs: Recent Labs  Lab 09/11/18 2101  LATICACIDVEN 1.3  1.36    Recent Results (from the past 240 hour(s))  Blood Culture (routine x 2)     Status: None (Preliminary result)   Collection Time: 09/11/18  9:21 PM  Result Value Ref Range Status   Specimen Description BLOOD LEFT HAND  Final   Special Requests   Final    BOTTLES DRAWN AEROBIC AND ANAEROBIC Blood Culture adequate volume   Culture   Final    NO GROWTH 3 DAYS Performed at [facility], [address], [place], [place] [address]    Report Status PENDING  Incomplete  Blood Culture (routine x 2)     Status: None (Preliminary result)   Collection Time: 09/11/18  9:21 PM  Result Value Ref Range Status   Specimen Description BLOOD RIGHT HAND  Final   Special Requests   Final    BOTTLES DRAWN AEROBIC AND ANAEROBIC Blood Culture adequate volume   Culture   Final    NO GROWTH 3 DAYS Performed at [facility], [address] [place], [place] [address]    Report Status PENDING  Incomplete  MRSA PCR Screening     Status: None   Collection Time: 09/12/18  5:56 AM  Result Value Ref Range Status   MRSA by PCR NEGATIVE NEGATIVE Final    Comment:        The GeneXpert MRSA Assay (FDA approved for NASAL  specimens only), is one component of a comprehensive MRSA colonization surveillance program. It is not intended to diagnose MRSA infection nor to guide or monitor treatment for MRSA infections. Performed at [facility], [address] [place], [place] [address]          Radiology Studies: No results found.      Scheduled Meds: . amoxicillin-clavulanate  1 tablet Oral Q12H  . aspirin EC  81 mg Oral Daily  . atenolol  25 mg Oral BID  . enoxaparin (LOVENOX) injection  40 mg Subcutaneous Q24H  . famotidine  20 mg Oral BID  . gabapentin  100 mg Oral QHS  . levothyroxine  150 mcg Oral QAC breakfast  . [START ON 09/15/2018] lisinopril  10 mg Oral Daily  . LORazepam  0.5 mg Oral QPM  .  LORazepam  1 mg Oral QHS  . nitroGLYCERIN  0.4 mg Sublingual UD  . pantoprazole  40 mg Oral Daily  . potassium chloride  10 mEq Oral Daily  . saccharomyces boulardii  250 mg Oral Daily  . simethicone  80 mg Oral BID   Continuous Infusions:    LOS: 3 days    Time spent: 8mins    Kathie Dike, MD [facility]   If 7PM-7AM, please contact night-coverage [email or web address]  09/14/2018, 1:23 PM

## 2018-09-15 ENCOUNTER — Encounter (HOSPITAL_COMMUNITY): Payer: Self-pay

## 2018-09-15 DIAGNOSIS — R7989 Other specified abnormal findings of blood chemistry: Secondary | ICD-10-CM

## 2018-09-15 MED ORDER — FUROSEMIDE 20 MG PO TABS: 20.0000 mg | ORAL_TABLET | Freq: Every day | ORAL | Status: DC | PRN

## 2018-09-15 NOTE — Care Management Note (Signed)
Case Management Note  Patient Details  Name: Lisa Kemp MRN: [id number] Date of Birth: Jun 01, 1924  Action/Plan: DME set up over weekend and ALF not happy with provider. Want to switch to [place] as that's who they have a "contract" with. They also requests [facility] be [facility] provider. Referral for home O2 given to [facility] who will deliver port O2 to pt room for transport after DC. HH referral given to [facility] rep.   Expected Discharge Date:  09/13/18               Expected Discharge Plan:  Assisted Living / Rest Home  In-House Referral:  Clinical Social Work  Discharge planning Services  CM Consult  Post Acute Care Choice:  Durable Medical Equipment, Home Health Choice offered to:  NA(facility)  DME Arranged:  Oxygen DME Agency:  [place] Arranged:  PT [place] Agency:  [facility]  Status of Service:  Completed, signed off  Sherald Barge, RN 09/15/2018, 1:25 PM

## 2018-09-15 NOTE — Discharge Summary (Signed)
Physician Discharge Summary  Lisa Kemp ZRA:[id number] DOB: [birth year] DOA: 09/11/2018  PCP: Glenda Chroman, MD  Admit date: 09/11/2018 Discharge date: 09/15/2018  Admitted From: ALF Disposition:  ALF  Recommendations for Outpatient Follow-up:  1. Follow up with PCP in 1-2 weeks 2. Please obtain BMP/CBC in one week 3. Repeat chest xray in 3-4 weeks to ensure resolution of pneumonia  Home Health:home health RN, PT Equipment/Devices:oxygen at 2L   Discharge Condition:stable CODE STATUS: DNR Diet recommendation: heart healthy  Brief/Interim Summary: 85 year old female with a history of hypertension, hypothyroidism, hyperlipidemia, previous intracranial hemorrhage, dementia, who is a resident of an assisted living facility, was brought to the hospital with increasing weakness and lethargy.  She was found to have healthcare associated pneumonia and was noted to be dehydrated.  She was admitted to the hospital and started on IV fluids as well as intravenous antibiotics.  Through her hospital course, her overall condition has improved.  She has been afebrile.  Respiratory status has stabilized.  She was transitioned from vancomycin and cefepime to Augmentin.  She is completed 5 days of antibiotic therapy.  Will discontinue further antibiotics since she is clinically better.  She did require supplemental oxygen, and will be discharged on 2 L which can be weaned off in the next 1 to 2 weeks.  She did have a mild elevation of troponin without any complaints of chest pain.  This was suspected to be demand ischemia.  Patient was evaluated by physical therapy and felt appropriate to return to assisted living with home health physical therapy.  She is felt stable for discharge today.  Discharge Diagnoses:  Principal Problem:   HCAP (healthcare-associated pneumonia) Active Problems:   Mixed hyperlipidemia   Essential hypertension, benign   CAD, NATIVE VESSEL   Carotid atherosclerosis  Hypothyroidism   GERD (gastroesophageal reflux disease)   Dementia (HCC)   Elevated troponin   Hyponatremia    Discharge Instructions  Discharge Instructions    Diet - low sodium heart healthy   Complete by:  As directed    Increase activity slowly   Complete by:  As directed      Allergies as of 09/15/2018      Reactions   Diazepam Other (See Comments)   Could not walk   Statins Other (See Comments)   CRAMPING      Medication List    STOP taking these medications   amoxicillin-clavulanate 875-125 MG tablet Commonly known as:  AUGMENTIN   ibuprofen 200 MG tablet Commonly known as:  ADVIL,MOTRIN   oseltamivir 75 MG capsule Commonly known as:  TAMIFLU     TAKE these medications   acetaminophen 500 MG tablet Commonly known as:  TYLENOL Take 500 mg by mouth every 8 (eight) hours as needed for mild pain or moderate pain.   aspirin 81 MG tablet Take 81 mg by mouth daily.   atenolol 25 MG tablet Commonly known as:  TENORMIN TAKE ONE TABLET BY MOUTH TWICE DAILY   calcium-vitamin D 500-200 MG-UNIT tablet Commonly known as:  OSCAL WITH D Take 1 tablet by mouth 2 (two) times daily.   ferrous sulfate 325 (65 FE) MG tablet Take 325 mg by mouth daily.   furosemide 20 MG tablet Commonly known as:  LASIX Take 1 tablet (20 mg total) by mouth daily as needed for fluid. What changed:    when to take this  reasons to take this   gabapentin 100 MG capsule Commonly known as:  NEURONTIN Take 1  capsule (100 mg total) by mouth at bedtime.   levothyroxine 150 MCG tablet Commonly known as:  SYNTHROID, LEVOTHROID Take 150 mcg by mouth daily before breakfast.   lisinopril 5 MG tablet Commonly known as:  PRINIVIL,ZESTRIL Take 5 mg by mouth daily.   loperamide 2 MG tablet Commonly known as:  IMODIUM A-D Take 2 mg by mouth 4 (four) times daily as needed for diarrhea or loose stools.   LORazepam 1 MG tablet Commonly known as:  ATIVAN Take 0.5-1 mg by mouth See admin  instructions. Take 0.5mg  by mouth daily in the evening. Take 1mg   At bedtime   nitroGLYCERIN 0.4 MG SL tablet Commonly known as:  NITROSTAT Place 1 tablet (0.4 mg total) under the tongue as directed.   omeprazole 40 MG capsule Commonly known as:  PRILOSEC Take 40 mg by mouth daily.   ondansetron 4 MG tablet Commonly known as:  ZOFRAN TAKE 1 TABLET BY MOUTH TWICE DAILY AS NEEDED FOR NAUSEA. What changed:  See the new instructions.   oyster calcium 500 MG Tabs tablet Take 500 mg of elemental calcium by mouth 2 (two) times daily.   potassium chloride 10 MEQ tablet Commonly known as:  K-DUR TAKE 1 tab daily with lasix What changed:    how much to take  how to take this  when to take this  additional instructions   ranitidine 150 MG tablet Commonly known as:  ZANTAC Take 150-300 mg by mouth 2 (two) times daily. 2 tablets in AM and 1 tablet in PM   saccharomyces boulardii 250 MG capsule Commonly known as:  FLORASTOR Take 250 mg by mouth daily. 10 day course starting on 09/12/2018   simethicone 80 MG chewable tablet Commonly known as:  MYLICON Chew 80 mg by mouth 2 (two) times daily.   trolamine salicylate 10 % cream Commonly known as:  ASPERCREME Apply 1 application topically 2 (two) times daily as needed for muscle pain.   TUSSIN DM 10-100 MG/5ML liquid Generic drug:  dextromethorphan-guaiFENesin Take 5 mLs by mouth every 6 (six) hours as needed for cough.   vitamin B-12 500 MCG tablet Commonly known as:  CYANOCOBALAMIN Take 500 mcg by mouth daily.   Vitamin D3 50 MCG (2000 UT) Tabs Take 1 tablet by mouth daily.            Durable Medical Equipment  (From admission, onward)         Start     Ordered   09/15/18 1222  For home use only DME oxygen  Once    Comments:  Liquid oxygen delivery system  Question Answer Comment  Mode or (Route) Nasal cannula   Liters per Minute 2   Frequency Continuous (stationary and portable oxygen unit needed)   Oxygen  conserving device Yes   Oxygen delivery system Other see comments      09/15/18 1221          Allergies  Allergen Reactions  . Diazepam Other (See Comments)    Could not walk  . Statins Other (See Comments)    CRAMPING    Consultations:     Procedures/Studies: Dg Chest Portable 1 View  Result Date: 09/11/2018 CLINICAL DATA:  Shortness of breath, dementia, altered mental status, weakness EXAM: PORTABLE CHEST 1 VIEW COMPARISON:  11/17/2017 FINDINGS: Increased interstitial markings. Mild patchy left upper lobe opacity, suspicious for pneumonia. Mild patchy right basilar opacity, atelectasis versus pneumonia. No definite pleural effusions. No pneumothorax. The heart is top-normal in size.  Thoracic  aortic atherosclerosis. IMPRESSION: Mild patchy left upper lobe opacity, suspicious for pneumonia. Mild right basilar opacity, atelectasis versus pneumonia. Electronically Signed   By: Julian Hy M.D.   On: 09/11/2018 21:32       Subjective: Denies shortness of breath or cough. Laying comfortably in bed  Discharge Exam: Vitals:   09/14/18 2123 09/14/18 2207 09/15/18 0539 09/15/18 0732  BP:  130/63 (!) 146/77   Pulse: 80 70 65   Resp:  19 18   Temp:  98.9 F (37.2 C) 98 F (36.7 C)   TempSrc:  Oral Oral   SpO2:  100% 97% 92%  Weight:      Height:        General: Pt is alert, awake, not in acute distress Cardiovascular: RRR, S1/S2 +, no rubs, no gallops Respiratory: occasional bilateral rhonchi Abdominal: Soft, NT, ND, bowel sounds + Extremities: no edema, no cyanosis    The results of significant diagnostics from this hospitalization (including imaging, microbiology, ancillary and laboratory) are listed below for reference.     Microbiology: Recent Results (from the past 240 hour(s))  Blood Culture (routine x 2)     Status: None (Preliminary result)   Collection Time: 09/11/18  9:21 PM  Result Value Ref Range Status   Specimen Description BLOOD LEFT HAND   Final   Special Requests   Final    BOTTLES DRAWN AEROBIC AND ANAEROBIC Blood Culture adequate volume   Culture   Final    NO GROWTH 4 DAYS Performed at [facility], [address] [place], [place] [address]    Report Status PENDING  Incomplete  Blood Culture (routine x 2)     Status: None (Preliminary result)   Collection Time: 09/11/18  9:21 PM  Result Value Ref Range Status   Specimen Description BLOOD RIGHT HAND  Final   Special Requests   Final    BOTTLES DRAWN AEROBIC AND ANAEROBIC Blood Culture adequate volume   Culture   Final    NO GROWTH 4 DAYS Performed at [facility], [address] [place], [place] [address]    Report Status PENDING  Incomplete  MRSA PCR Screening     Status: None   Collection Time: 09/12/18  5:56 AM  Result Value Ref Range Status   MRSA by PCR NEGATIVE NEGATIVE Final    Comment:        The GeneXpert MRSA Assay (FDA approved for NASAL specimens only), is one component of a comprehensive MRSA colonization surveillance program. It is not intended to diagnose MRSA infection nor to guide or monitor treatment for MRSA infections. Performed at [facility], [address], [place],  [address]      Labs: BNP (last 3 results) Recent Labs    11/17/17 1740 09/11/18 2101  BNP 142.0* 542.7*   Basic Metabolic Panel: Recent Labs  Lab 09/11/18 2101 09/13/18 0637  NA 127* 131*  K 3.9 4.2  CL 86* 96*  CO2 32 27  GLUCOSE 129* 88  BUN 14 9  CREATININE 0.81 0.62  CALCIUM 9.0 8.4*  MG 1.8  --   PHOS 3.4  --    Liver Function Tests: Recent Labs  Lab 09/11/18 2101  AST 26  ALT 20  ALKPHOS 50  BILITOT 0.6  PROT 6.5  ALBUMIN 3.0*   No results for input(s): LIPASE, AMYLASE in the last 168 hours. No results for input(s): AMMONIA in the last 168 hours. CBC: Recent Labs  Lab 09/11/18 2101 09/13/18 0637  WBC  12.0* 7.4  NEUTROABS 8.8*  --   HGB 12.9 12.7  HCT 39.8 40.6  MCV 90.5 94.6  PLT 288 303   Cardiac Enzymes: Recent  Labs  Lab 09/11/18 2101 09/12/18 0318 09/12/18 0849  TROPONINI 0.13* 0.13* 0.09*   BNP: Invalid input(s): POCBNP CBG: No results for input(s): GLUCAP in the last 168 hours. D-Dimer No results for input(s): DDIMER in the last 72 hours. Hgb A1c No results for input(s): HGBA1C in the last 72 hours. Lipid Profile No results for input(s): CHOL, HDL, LDLCALC, TRIG, CHOLHDL, LDLDIRECT in the last 72 hours. Thyroid function studies No results for input(s): TSH, T4TOTAL, T3FREE, THYROIDAB in the last 72 hours.  Invalid input(s): FREET3 Anemia work up No results for input(s): VITAMINB12, FOLATE, FERRITIN, TIBC, IRON, RETICCTPCT in the last 72 hours. Urinalysis    Component Value Date/Time   COLORURINE YELLOW 09/12/2018 0256   APPEARANCEUR CLEAR 09/12/2018 0256   LABSPEC 1.021 09/12/2018 0256   PHURINE 7.0 09/12/2018 0256   GLUCOSEU NEGATIVE 09/12/2018 0256   HGBUR MODERATE (A) 09/12/2018 0256   BILIRUBINUR NEGATIVE 09/12/2018 0256   KETONESUR NEGATIVE 09/12/2018 0256   PROTEINUR NEGATIVE 09/12/2018 0256   NITRITE NEGATIVE 09/12/2018 0256   LEUKOCYTESUR NEGATIVE 09/12/2018 0256   Sepsis Labs Invalid input(s): PROCALCITONIN,  WBC,  LACTICIDVEN Microbiology Recent Results (from the past 240 hour(s))  Blood Culture (routine x 2)     Status: None (Preliminary result)   Collection Time: 09/11/18  9:21 PM  Result Value Ref Range Status   Specimen Description BLOOD LEFT HAND  Final   Special Requests   Final    BOTTLES DRAWN AEROBIC AND ANAEROBIC Blood Culture adequate volume   Culture   Final    NO GROWTH 4 DAYS Performed at [facility], [address], [place], [place] [address]    Report Status PENDING  Incomplete  Blood Culture (routine x 2)     Status: None (Preliminary result)   Collection Time: 09/11/18  9:21 PM  Result Value Ref Range Status   Specimen Description BLOOD RIGHT HAND  Final   Special Requests   Final    BOTTLES DRAWN AEROBIC AND ANAEROBIC Blood Culture  adequate volume   Culture   Final    NO GROWTH 4 DAYS Performed at [facility], [address], [place], [place] [address]    Report Status PENDING  Incomplete  MRSA PCR Screening     Status: None   Collection Time: 09/12/18  5:56 AM  Result Value Ref Range Status   MRSA by PCR NEGATIVE NEGATIVE Final    Comment:        The GeneXpert MRSA Assay (FDA approved for NASAL specimens only), is one component of a comprehensive MRSA colonization surveillance program. It is not intended to diagnose MRSA infection nor to guide or monitor treatment for MRSA infections. Performed at [facility], [address] [place], [place] [address]      Time coordinating discharge: 36mins  SIGNED:   Kathie Dike, MD  [facility] 09/15/2018, 1:52 PM   If 7PM-7AM, please contact night-coverage [email or web address]

## 2018-09-15 NOTE — Clinical Social Work Note (Signed)
Butch Penny at [facility] advised of discharge and discharge clinicals sent.   Message left for sister, Ms. Sellars, advising of discharge.   RN to call facility upon receipt of transport oxygen being delivered from [facility]. Facility to provide transportation.   LCSW signing off.

## 2018-09-15 NOTE — Progress Notes (Signed)
Physical Therapy Treatment Patient Details Name: Lisa Kemp MRN: [id number] DOB: November 09, 1923 Today's Date: 09/15/2018    History of Present Illness Lisa Kemp is a 85 y.o. female with medical history significant of osteoarthritis, diastolic CHF, colon polyps, gastrointestinal bleed, GERD, hiatal hernia, hypertension, hypothyroidism, history of intracranial hemorrhage, mixed hyperlipidemia, peripheral neuropathy who is brought from her facility [facility]) due to generalized weakness and AMS.  She is normally able to walk with a walker, but has been unable to ambulate with a due to generalized weakness.  The [facility] staff stated that she was recently treated for influenza and is currently being treated with amoxicillin for an UTI.No further history is available.    PT Comments    Patient presents supine in bed with nurse tech checking vitals, pleasantly confused and agreeable to therapy. Patient performs supine ankle pumps and heel slides with occasional tactile cues for performance and verbal cues for attention to task. Patient reports 10/10 bil lower extremity pain at rest and with movement, but no facial wincing with exercise performance or transfers. Patient refuses to ambulate reporting "I don't want to". Patient performs supine to sit transfer requiring min assist for bil lower extremity moving to edge of bed and min assist for trunk uprighting with intermittent verbal cues for attention to task and sequencing. Patient performs sit to stand and stand pivot transfer using RW requiring increased time, initiation and tactile cues with verbal cues for sequencing and task performance. Patient becomes distracted during transfer requiring redirection to task, but able to complete with min assist. RN present in room during transfer and assists with verbal cues to complete task. Patient left in chair, bil lower extremity elevated, chair alarm on, call bell in lap, and RN in room. Patient  on 2 LPM and O2 sat 93-95% during activities with drop to 89% that resolved with rest and pursed lip breathing within 1 minute. Patient will benefit from continued physical therapy in hospital and recommended venue below.     Follow Up Recommendations  Home health PT;Supervision for mobility/OOB;Supervision - Intermittent     Equipment Recommendations  None recommended by PT    Recommendations for Other Services       Precautions / Restrictions Precautions Precautions: Fall Restrictions Weight Bearing Restrictions: No    Mobility  Bed Mobility Overal bed mobility: Needs Assistance Bed Mobility: Supine to Sit     Supine to sit: Min assist     General bed mobility comments: increased time, use of bed rail, tactile and verbal cues for sequencing  Transfers Overall transfer level: Needs assistance Equipment used: Rolling walker (2 wheeled) Transfers: Sit to/from [facility] Sit to Stand: Min assist Stand pivot transfers: Min assist       General transfer comment: increased time, tactile and verbal cues for sequencing  Ambulation/Gait Ambulation/Gait assistance: (patient refused to ambulate)               Stairs             Wheelchair Mobility    Modified Rankin (Stroke Patients Only)       Balance Overall balance assessment: Needs assistance Sitting-balance support: Feet supported;Bilateral upper extremity supported Sitting balance-Leahy Scale: Fair Sitting balance - Comments: seated edge of bed                                    Cognition Arousal/Alertness: Awake/alert Behavior During  Therapy: WFL for tasks assessed/performed Overall Cognitive Status: Within Functional Limits for tasks assessed                                 General Comments: disoriented to place and situation, requires constant verbal and tactile cues for tasks      Exercises General Exercises - Lower Extremity Ankle  Circles/Pumps: Supine;AROM;Strengthening;10 reps Heel Slides: Supine;AROM;Strengthening;10 reps(tactile cues)    General Comments        Pertinent Vitals/Pain Pain Assessment: Faces Pain Score: 10-Worst pain ever Faces Pain Scale: Hurts a little bit Pain Location: bil lower extremity Pain Descriptors / Indicators: Aching;Sore Pain Intervention(s): Limited activity within patient's tolerance;Monitored during session    Home Living                      Prior Function            PT Goals (current goals can now be found in the care plan section) Acute Rehab PT Goals Patient Stated Goal: return to ALF PT Goal Formulation: With patient Time For Goal Achievement: 09/20/18 Potential to Achieve Goals: Good Progress towards PT goals: Progressing toward goals    Frequency    Min 2X/week      PT Plan Current plan remains appropriate    Co-evaluation              AM-PAC PT "6 Clicks" Mobility   Outcome Measure  Help needed turning from your back to your side while in a flat bed without using bedrails?: A Little Help needed moving from lying on your back to sitting on the side of a flat bed without using bedrails?: A Little Help needed moving to and from a bed to a chair (including a wheelchair)?: A Little Help needed standing up from a chair using your arms (e.g., wheelchair or bedside chair)?: A Little Help needed to walk in hospital room?: A Little Help needed climbing 3-5 steps with a railing? : A Lot 6 Click Score: 17    End of Session Equipment Utilized During Treatment: Gait belt Activity Tolerance: Patient tolerated treatment well;Patient limited by fatigue Patient left: in chair;with call bell/phone within reach;with chair alarm set;with nursing/sitter in room Nurse Communication: Mobility status PT Visit Diagnosis: Unsteadiness on feet (R26.81);Other abnormalities of gait and mobility (R26.89)     Time: 1356-1420 PT Time Calculation (min)  (ACUTE ONLY): 24 min  Charges:  $Therapeutic Exercise: 8-22 mins $Therapeutic Activity: 8-22 mins                     2:39 PM, 09/15/18 Talbot Grumbling, PT, DPT Physical Therapist with [facility] [phone] mobile phone

## 2018-09-15 NOTE — NC FL2 (Signed)
[place] LEVEL OF CARE SCREENING TOOL     IDENTIFICATION  Patient Name: Lisa Kemp Birthdate: 11-Dec-1923 Sex: female Admission Date (Current Location): 09/11/2018  [facility] and [place] Number:  [employer] and Address:  [place] [address], [place]      Provider Number: [id number]  Attending Physician Name and Address:  Kathie Dike, MD  Relative Name and Phone Number:       Current Level of Care: Hospital Recommended Level of Care: [place] Prior Approval Number:    Date Approved/Denied:   PASRR Number:    Discharge Plan: Domiciliary (Rest home)(ALF)    Current Diagnoses: Patient Active Problem List   Diagnosis Date Noted  . Hyponatremia 09/12/2018  . HCAP (healthcare-associated pneumonia) 09/11/2018  . Hypothyroidism 09/11/2018  . GERD (gastroesophageal reflux disease) 09/11/2018  . Dementia ([place]) 09/11/2018  . Elevated troponin 09/11/2018  . Carotid atherosclerosis 06/11/2014  . Osteoarthritis of left knee 02/17/2014  . SDH (subdural hematoma) ([place]) 12/25/2013  . Intracerebral hemorrhage ([place]) 12/22/2013  . Essential hypertension, benign 08/01/2009  . Mixed hyperlipidemia 06/12/2009  . CAD, NATIVE VESSEL 06/12/2009    Orientation RESPIRATION BLADDER Height & Weight     Self  O2(2L) Incontinent Weight: 148 lb 13 oz (67.5 kg) Height:  5\' 3"  (160 cm)  BEHAVIORAL SYMPTOMS/MOOD NEUROLOGICAL BOWEL NUTRITION STATUS      Incontinent Diet(heart healthy which facility indicates equates to their REGULAR diet. )  AMBULATORY STATUS COMMUNICATION OF NEEDS Skin   Extensive Assist Verbally Normal                       Personal Care Assistance Level of Assistance  Bathing, Feeding, Dressing Bathing Assistance: Limited assistance Feeding assistance: Independent Dressing Assistance: Limited assistance     Functional Limitations Info  Sight, Hearing, Speech Sight Info:  Adequate Hearing Info: Adequate Speech Info: Adequate    SPECIAL CARE FACTORS FREQUENCY  PT (By licensed PT)     PT Frequency: 3x/week               Contractures Contractures Info: Not present    Additional Factors Info  Code Status, Allergies, Psychotropic Code Status Info: Full Code Allergies Info: Diazepam, Statins Psychotropic Info: Ativan          Current Medications (09/15/2018):  This is the current hospital active medication list Current Facility-Administered Medications  Medication Dose Route Frequency Provider Last Rate Last Dose  . acetaminophen (TYLENOL) tablet 650 mg  650 mg Oral Q6H PRN Lovey Newcomer T, NP   650 mg at 09/15/18 0608  . amoxicillin-clavulanate (AUGMENTIN) 875-125 MG per tablet 1 tablet  1 tablet Oral Q12H Kathie Dike, MD   1 tablet at 09/14/18 2124  . aspirin EC tablet 81 mg  81 mg Oral Daily Reubin Milan, MD   81 mg at 09/14/18 1740  . atenolol (TENORMIN) tablet 25 mg  25 mg Oral BID Reubin Milan, MD   25 mg at 09/14/18 2124  . enoxaparin (LOVENOX) injection 40 mg  40 mg Subcutaneous Q24H Reubin Milan, MD   40 mg at 09/15/18 0557  . famotidine (PEPCID) tablet 20 mg  20 mg Oral BID Reubin Milan, MD   20 mg at 09/14/18 2124  . gabapentin (NEURONTIN) capsule 100 mg  100 mg Oral QHS Reubin Milan, MD   100 mg at 09/14/18 2124  . guaiFENesin-dextromethorphan (ROBITUSSIN DM) 100-10 MG/5ML syrup 5  mL  5 mL Oral Q6H PRN Kathie Dike, MD      . hydrALAZINE (APRESOLINE) injection 10 mg  10 mg Intravenous Q6H PRN Kathie Dike, MD   10 mg at 09/13/18 2151  . levothyroxine (SYNTHROID, LEVOTHROID) tablet 150 mcg  150 mcg Oral QAC breakfast Reubin Milan, MD   150 mcg at 09/15/18 0557  . lisinopril (PRINIVIL,ZESTRIL) tablet 10 mg  10 mg Oral Daily Memon, Jolaine Artist, MD      . LORazepam (ATIVAN) tablet 0.5 mg  0.5 mg Oral QPM Reubin Milan, MD   0.5 mg at 09/13/18 1702  . LORazepam (ATIVAN) tablet 1 mg  1 mg  Oral QHS Reubin Milan, MD   1 mg at 09/14/18 2124  . MUSCLE RUB CREA   Topical BID PRN Kathie Dike, MD      . nitroGLYCERIN (NITROSTAT) SL tablet 0.4 mg  0.4 mg Sublingual UD Reubin Milan, MD      . pantoprazole (PROTONIX) EC tablet 40 mg  40 mg Oral Daily Reubin Milan, MD   40 mg at 09/14/18 0768  . potassium chloride (K-DUR,KLOR-CON) CR tablet 10 mEq  10 mEq Oral Daily Reubin Milan, MD   10 mEq at 09/14/18 0881  . saccharomyces boulardii (FLORASTOR) capsule 250 mg  250 mg Oral Daily Reubin Milan, MD   250 mg at 09/14/18 1031  . simethicone (MYLICON) chewable tablet 80 mg  80 mg Oral BID Reubin Milan, MD   80 mg at 09/14/18 2125     Discharge Medications:  TAKE these medications       acetaminophen 500 MG tablet Commonly known as:  TYLENOL Take 500 mg by mouth every 8 (eight) hours as needed for mild pain or moderate pain.   aspirin 81 MG tablet Take 81 mg by mouth daily.   atenolol 25 MG tablet Commonly known as:  TENORMIN TAKE ONE TABLET BY MOUTH TWICE DAILY   calcium-vitamin D 500-200 MG-UNIT tablet Commonly known as:  OSCAL WITH D Take 1 tablet by mouth 2 (two) times daily.   ferrous sulfate 325 (65 FE) MG tablet Take 325 mg by mouth daily.   furosemide 20 MG tablet Commonly known as:  LASIX Take 1 tablet (20 mg total) by mouth daily as needed for fluid. What changed:    when to take this  reasons to take this   gabapentin 100 MG capsule Commonly known as:  NEURONTIN Take 1 capsule (100 mg total) by mouth at bedtime.   levothyroxine 150 MCG tablet Commonly known as:  SYNTHROID, LEVOTHROID Take 150 mcg by mouth daily before breakfast.   lisinopril 5 MG tablet Commonly known as:  PRINIVIL,ZESTRIL Take 5 mg by mouth daily.   loperamide 2 MG tablet Commonly known as:  IMODIUM A-D Take 2 mg by mouth 4 (four) times daily as needed for diarrhea or loose stools.   LORazepam 1 MG tablet Commonly known as:   ATIVAN Take 0.5-1 mg by mouth See admin instructions. Take 0.5mg  by mouth daily in the evening. Take 1mg   At bedtime   nitroGLYCERIN 0.4 MG SL tablet Commonly known as:  NITROSTAT Place 1 tablet (0.4 mg total) under the tongue as directed.   omeprazole 40 MG capsule Commonly known as:  PRILOSEC Take 40 mg by mouth daily.   ondansetron 4 MG tablet Commonly known as:  ZOFRAN TAKE 1 TABLET BY MOUTH TWICE DAILY AS NEEDED FOR NAUSEA. What changed:  See the new instructions.  oyster calcium 500 MG Tabs tablet Take 500 mg of elemental calcium by mouth 2 (two) times daily.   potassium chloride 10 MEQ tablet Commonly known as:  K-DUR TAKE 1 tab daily with lasix What changed:    how much to take  how to take this  when to take this  additional instructions   ranitidine 150 MG tablet Commonly known as:  ZANTAC Take 150-300 mg by mouth 2 (two) times daily. 2 tablets in AM and 1 tablet in PM   saccharomyces boulardii 250 MG capsule Commonly known as:  FLORASTOR Take 250 mg by mouth daily. 10 day course starting on 09/12/2018   simethicone 80 MG chewable tablet Commonly known as:  MYLICON Chew 80 mg by mouth 2 (two) times daily.   trolamine salicylate 10 % cream Commonly known as:  ASPERCREME Apply 1 application topically 2 (two) times daily as needed for muscle pain.   TUSSIN DM 10-100 MG/5ML liquid Generic drug:  dextromethorphan-guaiFENesin Take 5 mLs by mouth every 6 (six) hours as needed for cough.   vitamin B-12 500 MCG tablet Commonly known as:  CYANOCOBALAMIN Take 500 mcg by mouth daily.   Vitamin D3 50 MCG (2000 UT) Tabs Take 1 tablet by mouth daily.     Relevant Imaging Results:  Relevant Lab Results:   Additional Information    Shaine Mount, Clydene Pugh, LCSW

## 2018-09-15 NOTE — Progress Notes (Signed)
Report called to Butch Penny at [facility], oxygen delivered for transport and transportation here to pick patient up. Patient discharged to [facility] today per MD orders. Patient vital signs WDL. IV removed and site WDL. Discharge Instructions including follow up appointments, medications, and education reviewed with receiving nurse.  Patient is transported out via wheelchair.

## 2018-09-15 NOTE — Progress Notes (Signed)
SATURATION QUALIFICATIONS: (This note is used to comply with regulatory documentation for home oxygen)  Patient Saturations on Room Air at Rest = 94%  Patient Saturations on Room Air while Ambulating = 81%  Patient Saturations on 2 Liters of oxygen while Ambulating = 97%  Please briefly explain why patient needs home oxygen: Patient oxygen saturation decreases without supplemental oxygen when ambulating.

## 2018-09-15 NOTE — NC FL2 (Deleted)
[place] LEVEL OF CARE SCREENING TOOL     IDENTIFICATION  Patient Name: Lisa Kemp Birthdate: Feb 12, 1924 Sex: female Admission Date (Current Location): 09/11/2018  [facility] and [place] Number:  [employer] and Address:  [place] [address] [place]      Provider Number: [id number]  Attending Physician Name and Address:  Kathie Dike, MD  Relative Name and Phone Number:       Current Level of Care: Hospital Recommended Level of Care: [place] Prior Approval Number:    Date Approved/Denied:   PASRR Number:    Discharge Plan: Domiciliary (Rest home)(ALF)    Current Diagnoses: Patient Active Problem List   Diagnosis Date Noted  . Hyponatremia 09/12/2018  . HCAP (healthcare-associated pneumonia) 09/11/2018  . Hypothyroidism 09/11/2018  . GERD (gastroesophageal reflux disease) 09/11/2018  . Dementia ([place]) 09/11/2018  . Elevated troponin 09/11/2018  . Carotid atherosclerosis 06/11/2014  . Osteoarthritis of left knee 02/17/2014  . SDH (subdural hematoma) ([place]) 12/25/2013  . Intracerebral hemorrhage ([place]) 12/22/2013  . Essential hypertension, benign 08/01/2009  . Mixed hyperlipidemia 06/12/2009  . CAD, NATIVE VESSEL 06/12/2009    Orientation RESPIRATION BLADDER Height & Weight     Self  O2(2L) Incontinent Weight: 148 lb 13 oz (67.5 kg) Height:  5\' 3"  (160 cm)  BEHAVIORAL SYMPTOMS/MOOD NEUROLOGICAL BOWEL NUTRITION STATUS      Incontinent Diet(heart healthy which facility indicates equates to their REGULAR diet. )  AMBULATORY STATUS COMMUNICATION OF NEEDS Skin   Extensive Assist Verbally Normal                       Personal Care Assistance Level of Assistance  Bathing, Feeding, Dressing Bathing Assistance: Limited assistance Feeding assistance: Independent Dressing Assistance: Limited assistance     Functional Limitations Info  Sight, Hearing, Speech Sight Info:  Adequate Hearing Info: Adequate Speech Info: Adequate    SPECIAL CARE FACTORS FREQUENCY                       Contractures Contractures Info: Not present    Additional Factors Info  Code Status, Allergies, Psychotropic Code Status Info: Full Code Allergies Info: Diazepam, Statins Psychotropic Info: Ativan          Current Medications (09/15/2018):  This is the current hospital active medication list Current Facility-Administered Medications  Medication Dose Route Frequency Provider Last Rate Last Dose  . acetaminophen (TYLENOL) tablet 650 mg  650 mg Oral Q6H PRN Lovey Newcomer T, NP   650 mg at 09/15/18 0608  . amoxicillin-clavulanate (AUGMENTIN) 875-125 MG per tablet 1 tablet  1 tablet Oral Q12H Kathie Dike, MD   1 tablet at 09/14/18 2124  . aspirin EC tablet 81 mg  81 mg Oral Daily Reubin Milan, MD   81 mg at 09/14/18 3662  . atenolol (TENORMIN) tablet 25 mg  25 mg Oral BID Reubin Milan, MD   25 mg at 09/14/18 2124  . enoxaparin (LOVENOX) injection 40 mg  40 mg Subcutaneous Q24H Reubin Milan, MD   40 mg at 09/15/18 0557  . famotidine (PEPCID) tablet 20 mg  20 mg Oral BID Reubin Milan, MD   20 mg at 09/14/18 2124  . gabapentin (NEURONTIN) capsule 100 mg  100 mg Oral QHS Reubin Milan, MD   100 mg at 09/14/18 2124  . guaiFENesin-dextromethorphan (ROBITUSSIN DM) 100-10 MG/5ML syrup 5 mL  5 mL  Oral Q6H PRN Kathie Dike, MD      . hydrALAZINE (APRESOLINE) injection 10 mg  10 mg Intravenous Q6H PRN Kathie Dike, MD   10 mg at 09/13/18 2151  . levothyroxine (SYNTHROID, LEVOTHROID) tablet 150 mcg  150 mcg Oral QAC breakfast Reubin Milan, MD   150 mcg at 09/15/18 0557  . lisinopril (PRINIVIL,ZESTRIL) tablet 10 mg  10 mg Oral Daily Memon, Jolaine Artist, MD      . LORazepam (ATIVAN) tablet 0.5 mg  0.5 mg Oral QPM Reubin Milan, MD   0.5 mg at 09/13/18 1702  . LORazepam (ATIVAN) tablet 1 mg  1 mg Oral QHS Reubin Milan, MD   1 mg  at 09/14/18 2124  . MUSCLE RUB CREA   Topical BID PRN Kathie Dike, MD      . nitroGLYCERIN (NITROSTAT) SL tablet 0.4 mg  0.4 mg Sublingual UD Reubin Milan, MD      . pantoprazole (PROTONIX) EC tablet 40 mg  40 mg Oral Daily Reubin Milan, MD   40 mg at 09/14/18 8185  . potassium chloride (K-DUR,KLOR-CON) CR tablet 10 mEq  10 mEq Oral Daily Reubin Milan, MD   10 mEq at 09/14/18 6314  . saccharomyces boulardii (FLORASTOR) capsule 250 mg  250 mg Oral Daily Reubin Milan, MD   250 mg at 09/14/18 9702  . simethicone (MYLICON) chewable tablet 80 mg  80 mg Oral BID Reubin Milan, MD   80 mg at 09/14/18 2125     Discharge Medications:  TAKE these medications       acetaminophen 500 MG tablet Commonly known as:  TYLENOL Take 500 mg by mouth every 8 (eight) hours as needed for mild pain or moderate pain.   aspirin 81 MG tablet Take 81 mg by mouth daily.   atenolol 25 MG tablet Commonly known as:  TENORMIN TAKE ONE TABLET BY MOUTH TWICE DAILY   calcium-vitamin D 500-200 MG-UNIT tablet Commonly known as:  OSCAL WITH D Take 1 tablet by mouth 2 (two) times daily.   ferrous sulfate 325 (65 FE) MG tablet Take 325 mg by mouth daily.   furosemide 20 MG tablet Commonly known as:  LASIX Take 1 tablet (20 mg total) by mouth daily as needed for fluid. What changed:    when to take this  reasons to take this   gabapentin 100 MG capsule Commonly known as:  NEURONTIN Take 1 capsule (100 mg total) by mouth at bedtime.   levothyroxine 150 MCG tablet Commonly known as:  SYNTHROID, LEVOTHROID Take 150 mcg by mouth daily before breakfast.   lisinopril 5 MG tablet Commonly known as:  PRINIVIL,ZESTRIL Take 5 mg by mouth daily.   loperamide 2 MG tablet Commonly known as:  IMODIUM A-D Take 2 mg by mouth 4 (four) times daily as needed for diarrhea or loose stools.   LORazepam 1 MG tablet Commonly known as:  ATIVAN Take 0.5-1 mg by mouth See admin  instructions. Take 0.5mg  by mouth daily in the evening. Take 1mg   At bedtime   nitroGLYCERIN 0.4 MG SL tablet Commonly known as:  NITROSTAT Place 1 tablet (0.4 mg total) under the tongue as directed.   omeprazole 40 MG capsule Commonly known as:  PRILOSEC Take 40 mg by mouth daily.   ondansetron 4 MG tablet Commonly known as:  ZOFRAN TAKE 1 TABLET BY MOUTH TWICE DAILY AS NEEDED FOR NAUSEA. What changed:  See the new instructions.   oyster calcium 500  MG Tabs tablet Take 500 mg of elemental calcium by mouth 2 (two) times daily.   potassium chloride 10 MEQ tablet Commonly known as:  K-DUR TAKE 1 tab daily with lasix What changed:    how much to take  how to take this  when to take this  additional instructions   ranitidine 150 MG tablet Commonly known as:  ZANTAC Take 150-300 mg by mouth 2 (two) times daily. 2 tablets in AM and 1 tablet in PM   saccharomyces boulardii 250 MG capsule Commonly known as:  FLORASTOR Take 250 mg by mouth daily. 10 day course starting on 09/12/2018   simethicone 80 MG chewable tablet Commonly known as:  MYLICON Chew 80 mg by mouth 2 (two) times daily.   trolamine salicylate 10 % cream Commonly known as:  ASPERCREME Apply 1 application topically 2 (two) times daily as needed for muscle pain.   TUSSIN DM 10-100 MG/5ML liquid Generic drug:  dextromethorphan-guaiFENesin Take 5 mLs by mouth every 6 (six) hours as needed for cough.   vitamin B-12 500 MCG tablet Commonly known as:  CYANOCOBALAMIN Take 500 mcg by mouth daily.   Vitamin D3 50 MCG (2000 UT) Tabs Take 1 tablet by mouth daily.      Relevant Imaging Results:  Relevant Lab Results:   Additional Information    Blanka Rockholt, Clydene Pugh, LCSW

## 2018-09-15 NOTE — Care Management Important Message (Signed)
Important Message  Patient Details  Name: Lisa Kemp MRN: [id number] Date of Birth: Dec 31, 1923   Medicare Important Message Given:  Yes    Sherald Barge, RN 09/15/2018, 1:25 PM

## 2018-09-15 NOTE — Care Management (Signed)
Patient Information   Patient Name Lisa Kemp, Lisa Kemp ([id number]) Sex Female DOB [birth year]  Room Bed  A331 A331-01  Patient Demographics   Address (Temporary) [facility] [address] [address] [place] [address] Contact Numbers (Temporary) [phone]  Patient Ethnicity & Race   Ethnic Group Patient Race  Not Hispanic or Latino White or Caucasian  Emergency Contact(s)   Name Relation Home Work Mobile  [place] Sister [phone]    Trystyn, Sitts Relative [phone]  [phone]  Shadaya, Marschner [phone]    Documents on File    Status Date Received Description  Documents for the Patient  EMR Medication Summary Not Received    EMR Problem Summary Not Received    EMR Patient Summary Not Received    Driver's License Not Received    [place] Not Received    [place] E-Signature HIPAA Notice of Privacy Received 02/05/11   [place] E-Signature HIPAA Notice of Privacy Spanish Not Received    Advance Directives/Living Will/HCPOA/POA Not Received    Insurance Card Received 10/31/15 MEDICARE & COMBINED  Financial Application Not Received    AMB Outside Hospital Record Not Received  11/12 [facility]  Insurance Card Not Received  [employer]  [place] HIPAA NOTICE OF PRIVACY - Scanned Not Received    [place] E-Signature HIPAA Notice of Privacy     AMB HH/NH/Hospice  02/19/14 05/15 [place] Card Received 03/08/14 [facility]  AMB Intake Forms/Questionnaires  04/06/14   AMB HH/NH/Hospice  01/21/14 ORDER [facility]  AMB HH/NH/Hospice  01/22/14 ORDER [facility]  AMB HH/NH/Hospice  01/01/14 ORDER [facility]  AMB HH/NH/Hospice  03/04/14 ORDER [facility]  AMB HH/NH/Hospice  55/73/22 HOME HEALTH CERTIFICATION POC [facility]  AMB HH/NH/Hospice  01/08/14 PROFESSIONAL COMMUNICATION [facility]  Other Photo ID Not Received    AMB  Outside Hospital Record  11/30/15 H&P [place] Card Received 12/20/15 MCR/COMBINED/RCGD/HHS  Release of Information Received 12/20/15 DPR/RCGD/HHS  HIM ROI Authorization  12/30/15 RCGD--12/20/15 labs to Dr Woody Seller  HIM ROI Authorization  01/17/16 RCGD--12/26/15 labs to PCP (vyas)  HIM ROI Authorization  03/02/16 RCGD--02/14/16 labs to PCP (vyas)  Insurance Card Not Received (Deleted)    AMB Outside Hospital Record (Deleted) 03/17/12   Insurance Card Not Received (Deleted)    Driver's License Not Received (Deleted)    Insurance Card Not Received (Deleted)    Advance Directives/Living Will/HCPOA/POA Not Received (Deleted)    Advanced Beneficiary Notice (ABN) Not Received (Deleted)    Other Photo ID Not Received (Deleted)    Documents for the Encounter  AOB (Assignment of Insurance Benefits) Not Received    E-signature AOB Received 09/11/18 UNABLE DUE TO CONDITION  MEDICARE RIGHTS Not Received    E-signature Medicare Rights Received 09/11/18 UNABLE DUE TO CONDITION  ED Patient Billing Extract   ED PB Billing Extract  Cardiac Monitoring Strip Received 09/12/18   Cardiac Monitoring Strip Shift Summary Received 09/12/18   EKG Received (Deleted) 09/12/18   EKG Received 09/12/18   Admission Information   Current Information   Attending Provider Admitting Provider Admission Type Admission Status  Kathie Dike, MD Reubin Milan, MD Emergency Admission (Confirmed)       Admission Date/Time Discharge Date Hospital Service Auth/Cert Status  02/54/27 08:26 PM  [facility] Room/Bed   [facility] AP-DEPT  300 A331/A331-01        Admission   Complaint  ams generalized weakness  Hospital Account   Name Acct ID Class Status Primary Coverage  Josiane, [place] [id number] Inpatient Open [place]      Guarantor Account (for Hospital Account [id number])   Name Relation to Pt  Service Area Active? Acct Type  [name] Self [facility] Yes Personal/Family  Address Phone    [facility] [address], [place] [address] [phone](K)        Coverage Information (for Hospital Account [id number])   F/O Payor/Plan Precert #  AETNA MEDICARE/AETNA MEDICARE HMO/PPO   Subscriber Subscriber #  Shlonda, [place] [facility]  [place] [address] [phone]       Care Everywhere ID:  [id number]

## 2018-09-16 LAB — CULTURE, BLOOD (ROUTINE X 2)
CULTURE: NO GROWTH
Culture: NO GROWTH
SPECIAL REQUESTS: ADEQUATE
Special Requests: ADEQUATE

## 2018-09-30 DIAGNOSIS — B351 Tinea unguium: Secondary | ICD-10-CM | POA: Diagnosis not present

## 2018-09-30 DIAGNOSIS — M79674 Pain in right toe(s): Secondary | ICD-10-CM | POA: Diagnosis not present

## 2018-09-30 DIAGNOSIS — M79675 Pain in left toe(s): Secondary | ICD-10-CM | POA: Diagnosis not present

## 2018-10-12 DIAGNOSIS — M6281 Muscle weakness (generalized): Secondary | ICD-10-CM | POA: Diagnosis not present

## 2018-10-12 DIAGNOSIS — I2510 Atherosclerotic heart disease of native coronary artery without angina pectoris: Secondary | ICD-10-CM | POA: Diagnosis not present

## 2018-10-12 DIAGNOSIS — J189 Pneumonia, unspecified organism: Secondary | ICD-10-CM | POA: Diagnosis not present

## 2018-11-04 DIAGNOSIS — R2689 Other abnormalities of gait and mobility: Secondary | ICD-10-CM | POA: Diagnosis not present

## 2018-11-04 DIAGNOSIS — M6281 Muscle weakness (generalized): Secondary | ICD-10-CM | POA: Diagnosis not present

## 2018-11-25 DIAGNOSIS — F0390 Unspecified dementia without behavioral disturbance: Secondary | ICD-10-CM | POA: Diagnosis not present

## 2018-11-25 DIAGNOSIS — G5793 Unspecified mononeuropathy of bilateral lower limbs: Secondary | ICD-10-CM | POA: Diagnosis not present

## 2018-11-25 DIAGNOSIS — M6281 Muscle weakness (generalized): Secondary | ICD-10-CM | POA: Diagnosis not present

## 2018-11-27 DIAGNOSIS — R2689 Other abnormalities of gait and mobility: Secondary | ICD-10-CM | POA: Diagnosis not present

## 2018-11-27 DIAGNOSIS — M6281 Muscle weakness (generalized): Secondary | ICD-10-CM | POA: Diagnosis not present

## 2018-12-01 DIAGNOSIS — G5793 Unspecified mononeuropathy of bilateral lower limbs: Secondary | ICD-10-CM | POA: Diagnosis not present

## 2018-12-01 DIAGNOSIS — F0390 Unspecified dementia without behavioral disturbance: Secondary | ICD-10-CM | POA: Diagnosis not present

## 2018-12-01 DIAGNOSIS — M6281 Muscle weakness (generalized): Secondary | ICD-10-CM | POA: Diagnosis not present

## 2018-12-02 DIAGNOSIS — R2689 Other abnormalities of gait and mobility: Secondary | ICD-10-CM | POA: Diagnosis not present

## 2018-12-02 DIAGNOSIS — M6281 Muscle weakness (generalized): Secondary | ICD-10-CM | POA: Diagnosis not present

## 2018-12-03 DIAGNOSIS — G5793 Unspecified mononeuropathy of bilateral lower limbs: Secondary | ICD-10-CM | POA: Diagnosis not present

## 2018-12-03 DIAGNOSIS — M6281 Muscle weakness (generalized): Secondary | ICD-10-CM | POA: Diagnosis not present

## 2018-12-03 DIAGNOSIS — F0390 Unspecified dementia without behavioral disturbance: Secondary | ICD-10-CM | POA: Diagnosis not present

## 2018-12-04 DIAGNOSIS — M6281 Muscle weakness (generalized): Secondary | ICD-10-CM | POA: Diagnosis not present

## 2018-12-04 DIAGNOSIS — R2689 Other abnormalities of gait and mobility: Secondary | ICD-10-CM | POA: Diagnosis not present

## 2018-12-09 DIAGNOSIS — M6281 Muscle weakness (generalized): Secondary | ICD-10-CM | POA: Diagnosis not present

## 2018-12-09 DIAGNOSIS — R2689 Other abnormalities of gait and mobility: Secondary | ICD-10-CM | POA: Diagnosis not present

## 2018-12-09 DIAGNOSIS — G5793 Unspecified mononeuropathy of bilateral lower limbs: Secondary | ICD-10-CM | POA: Diagnosis not present

## 2018-12-09 DIAGNOSIS — F0390 Unspecified dementia without behavioral disturbance: Secondary | ICD-10-CM | POA: Diagnosis not present

## 2018-12-11 DIAGNOSIS — F0390 Unspecified dementia without behavioral disturbance: Secondary | ICD-10-CM | POA: Diagnosis not present

## 2018-12-11 DIAGNOSIS — M6281 Muscle weakness (generalized): Secondary | ICD-10-CM | POA: Diagnosis not present

## 2018-12-11 DIAGNOSIS — G5793 Unspecified mononeuropathy of bilateral lower limbs: Secondary | ICD-10-CM | POA: Diagnosis not present

## 2018-12-11 DIAGNOSIS — R2689 Other abnormalities of gait and mobility: Secondary | ICD-10-CM | POA: Diagnosis not present

## 2018-12-15 DIAGNOSIS — M6281 Muscle weakness (generalized): Secondary | ICD-10-CM | POA: Diagnosis not present

## 2018-12-15 DIAGNOSIS — G5793 Unspecified mononeuropathy of bilateral lower limbs: Secondary | ICD-10-CM | POA: Diagnosis not present

## 2018-12-15 DIAGNOSIS — F0390 Unspecified dementia without behavioral disturbance: Secondary | ICD-10-CM | POA: Diagnosis not present

## 2018-12-16 DIAGNOSIS — M6281 Muscle weakness (generalized): Secondary | ICD-10-CM | POA: Diagnosis not present

## 2018-12-16 DIAGNOSIS — R2689 Other abnormalities of gait and mobility: Secondary | ICD-10-CM | POA: Diagnosis not present

## 2018-12-17 DIAGNOSIS — F0390 Unspecified dementia without behavioral disturbance: Secondary | ICD-10-CM | POA: Diagnosis not present

## 2018-12-17 DIAGNOSIS — M6281 Muscle weakness (generalized): Secondary | ICD-10-CM | POA: Diagnosis not present

## 2018-12-17 DIAGNOSIS — G5793 Unspecified mononeuropathy of bilateral lower limbs: Secondary | ICD-10-CM | POA: Diagnosis not present

## 2018-12-18 DIAGNOSIS — M6281 Muscle weakness (generalized): Secondary | ICD-10-CM | POA: Diagnosis not present

## 2018-12-18 DIAGNOSIS — R2689 Other abnormalities of gait and mobility: Secondary | ICD-10-CM | POA: Diagnosis not present

## 2018-12-22 DIAGNOSIS — M6281 Muscle weakness (generalized): Secondary | ICD-10-CM | POA: Diagnosis not present

## 2018-12-22 DIAGNOSIS — F0390 Unspecified dementia without behavioral disturbance: Secondary | ICD-10-CM | POA: Diagnosis not present

## 2018-12-22 DIAGNOSIS — G5793 Unspecified mononeuropathy of bilateral lower limbs: Secondary | ICD-10-CM | POA: Diagnosis not present

## 2018-12-23 DIAGNOSIS — R2689 Other abnormalities of gait and mobility: Secondary | ICD-10-CM | POA: Diagnosis not present

## 2018-12-23 DIAGNOSIS — M6281 Muscle weakness (generalized): Secondary | ICD-10-CM | POA: Diagnosis not present

## 2018-12-24 DIAGNOSIS — R2689 Other abnormalities of gait and mobility: Secondary | ICD-10-CM | POA: Diagnosis not present

## 2018-12-24 DIAGNOSIS — E7800 Pure hypercholesterolemia, unspecified: Secondary | ICD-10-CM | POA: Diagnosis not present

## 2018-12-24 DIAGNOSIS — L0390 Cellulitis, unspecified: Secondary | ICD-10-CM | POA: Diagnosis not present

## 2018-12-24 DIAGNOSIS — Z299 Encounter for prophylactic measures, unspecified: Secondary | ICD-10-CM | POA: Diagnosis not present

## 2018-12-24 DIAGNOSIS — M6281 Muscle weakness (generalized): Secondary | ICD-10-CM | POA: Diagnosis not present

## 2018-12-24 DIAGNOSIS — I639 Cerebral infarction, unspecified: Secondary | ICD-10-CM | POA: Diagnosis not present

## 2018-12-24 DIAGNOSIS — Z6829 Body mass index (BMI) 29.0-29.9, adult: Secondary | ICD-10-CM | POA: Diagnosis not present

## 2018-12-24 DIAGNOSIS — F0390 Unspecified dementia without behavioral disturbance: Secondary | ICD-10-CM | POA: Diagnosis not present

## 2018-12-24 DIAGNOSIS — G5793 Unspecified mononeuropathy of bilateral lower limbs: Secondary | ICD-10-CM | POA: Diagnosis not present

## 2018-12-26 DIAGNOSIS — Z87820 Personal history of traumatic brain injury: Secondary | ICD-10-CM | POA: Diagnosis not present

## 2018-12-26 DIAGNOSIS — F0390 Unspecified dementia without behavioral disturbance: Secondary | ICD-10-CM | POA: Diagnosis not present

## 2018-12-26 DIAGNOSIS — M1712 Unilateral primary osteoarthritis, left knee: Secondary | ICD-10-CM | POA: Diagnosis not present

## 2018-12-26 DIAGNOSIS — R2689 Other abnormalities of gait and mobility: Secondary | ICD-10-CM | POA: Diagnosis not present

## 2018-12-26 DIAGNOSIS — S80812D Abrasion, left lower leg, subsequent encounter: Secondary | ICD-10-CM | POA: Diagnosis not present

## 2018-12-26 DIAGNOSIS — I6523 Occlusion and stenosis of bilateral carotid arteries: Secondary | ICD-10-CM | POA: Diagnosis not present

## 2018-12-26 DIAGNOSIS — G609 Hereditary and idiopathic neuropathy, unspecified: Secondary | ICD-10-CM | POA: Diagnosis not present

## 2018-12-26 DIAGNOSIS — E871 Hypo-osmolality and hyponatremia: Secondary | ICD-10-CM | POA: Diagnosis not present

## 2018-12-26 DIAGNOSIS — I2510 Atherosclerotic heart disease of native coronary artery without angina pectoris: Secondary | ICD-10-CM | POA: Diagnosis not present

## 2018-12-26 DIAGNOSIS — Z85828 Personal history of other malignant neoplasm of skin: Secondary | ICD-10-CM | POA: Diagnosis not present

## 2018-12-26 DIAGNOSIS — L03116 Cellulitis of left lower limb: Secondary | ICD-10-CM | POA: Diagnosis not present

## 2018-12-29 DIAGNOSIS — F0390 Unspecified dementia without behavioral disturbance: Secondary | ICD-10-CM | POA: Diagnosis not present

## 2018-12-29 DIAGNOSIS — L03116 Cellulitis of left lower limb: Secondary | ICD-10-CM | POA: Diagnosis not present

## 2018-12-29 DIAGNOSIS — E871 Hypo-osmolality and hyponatremia: Secondary | ICD-10-CM | POA: Diagnosis not present

## 2018-12-29 DIAGNOSIS — I6523 Occlusion and stenosis of bilateral carotid arteries: Secondary | ICD-10-CM | POA: Diagnosis not present

## 2018-12-29 DIAGNOSIS — S80812D Abrasion, left lower leg, subsequent encounter: Secondary | ICD-10-CM | POA: Diagnosis not present

## 2018-12-29 DIAGNOSIS — I2510 Atherosclerotic heart disease of native coronary artery without angina pectoris: Secondary | ICD-10-CM | POA: Diagnosis not present

## 2018-12-30 DIAGNOSIS — F0390 Unspecified dementia without behavioral disturbance: Secondary | ICD-10-CM | POA: Diagnosis not present

## 2018-12-30 DIAGNOSIS — I2510 Atherosclerotic heart disease of native coronary artery without angina pectoris: Secondary | ICD-10-CM | POA: Diagnosis not present

## 2018-12-30 DIAGNOSIS — L03116 Cellulitis of left lower limb: Secondary | ICD-10-CM | POA: Diagnosis not present

## 2018-12-30 DIAGNOSIS — E871 Hypo-osmolality and hyponatremia: Secondary | ICD-10-CM | POA: Diagnosis not present

## 2018-12-30 DIAGNOSIS — I6523 Occlusion and stenosis of bilateral carotid arteries: Secondary | ICD-10-CM | POA: Diagnosis not present

## 2018-12-30 DIAGNOSIS — S80812D Abrasion, left lower leg, subsequent encounter: Secondary | ICD-10-CM | POA: Diagnosis not present

## 2018-12-31 DIAGNOSIS — I2510 Atherosclerotic heart disease of native coronary artery without angina pectoris: Secondary | ICD-10-CM | POA: Diagnosis not present

## 2018-12-31 DIAGNOSIS — L03116 Cellulitis of left lower limb: Secondary | ICD-10-CM | POA: Diagnosis not present

## 2018-12-31 DIAGNOSIS — F0390 Unspecified dementia without behavioral disturbance: Secondary | ICD-10-CM | POA: Diagnosis not present

## 2018-12-31 DIAGNOSIS — S80812D Abrasion, left lower leg, subsequent encounter: Secondary | ICD-10-CM | POA: Diagnosis not present

## 2018-12-31 DIAGNOSIS — E871 Hypo-osmolality and hyponatremia: Secondary | ICD-10-CM | POA: Diagnosis not present

## 2018-12-31 DIAGNOSIS — I6523 Occlusion and stenosis of bilateral carotid arteries: Secondary | ICD-10-CM | POA: Diagnosis not present

## 2019-01-02 DIAGNOSIS — F0390 Unspecified dementia without behavioral disturbance: Secondary | ICD-10-CM | POA: Diagnosis not present

## 2019-01-02 DIAGNOSIS — E871 Hypo-osmolality and hyponatremia: Secondary | ICD-10-CM | POA: Diagnosis not present

## 2019-01-02 DIAGNOSIS — I6523 Occlusion and stenosis of bilateral carotid arteries: Secondary | ICD-10-CM | POA: Diagnosis not present

## 2019-01-02 DIAGNOSIS — I2510 Atherosclerotic heart disease of native coronary artery without angina pectoris: Secondary | ICD-10-CM | POA: Diagnosis not present

## 2019-01-02 DIAGNOSIS — S80812D Abrasion, left lower leg, subsequent encounter: Secondary | ICD-10-CM | POA: Diagnosis not present

## 2019-01-02 DIAGNOSIS — L03116 Cellulitis of left lower limb: Secondary | ICD-10-CM | POA: Diagnosis not present

## 2019-01-05 DIAGNOSIS — E871 Hypo-osmolality and hyponatremia: Secondary | ICD-10-CM | POA: Diagnosis not present

## 2019-01-05 DIAGNOSIS — I2510 Atherosclerotic heart disease of native coronary artery without angina pectoris: Secondary | ICD-10-CM | POA: Diagnosis not present

## 2019-01-05 DIAGNOSIS — I6523 Occlusion and stenosis of bilateral carotid arteries: Secondary | ICD-10-CM | POA: Diagnosis not present

## 2019-01-05 DIAGNOSIS — F0390 Unspecified dementia without behavioral disturbance: Secondary | ICD-10-CM | POA: Diagnosis not present

## 2019-01-05 DIAGNOSIS — S80812D Abrasion, left lower leg, subsequent encounter: Secondary | ICD-10-CM | POA: Diagnosis not present

## 2019-01-05 DIAGNOSIS — L03116 Cellulitis of left lower limb: Secondary | ICD-10-CM | POA: Diagnosis not present

## 2019-01-06 DIAGNOSIS — M1710 Unilateral primary osteoarthritis, unspecified knee: Secondary | ICD-10-CM | POA: Diagnosis not present

## 2019-01-06 DIAGNOSIS — Z713 Dietary counseling and surveillance: Secondary | ICD-10-CM | POA: Diagnosis not present

## 2019-01-06 DIAGNOSIS — Z299 Encounter for prophylactic measures, unspecified: Secondary | ICD-10-CM | POA: Diagnosis not present

## 2019-01-06 DIAGNOSIS — I10 Essential (primary) hypertension: Secondary | ICD-10-CM | POA: Diagnosis not present

## 2019-01-06 DIAGNOSIS — Z6829 Body mass index (BMI) 29.0-29.9, adult: Secondary | ICD-10-CM | POA: Diagnosis not present

## 2019-01-07 DIAGNOSIS — S80812D Abrasion, left lower leg, subsequent encounter: Secondary | ICD-10-CM | POA: Diagnosis not present

## 2019-01-07 DIAGNOSIS — F0390 Unspecified dementia without behavioral disturbance: Secondary | ICD-10-CM | POA: Diagnosis not present

## 2019-01-07 DIAGNOSIS — I6523 Occlusion and stenosis of bilateral carotid arteries: Secondary | ICD-10-CM | POA: Diagnosis not present

## 2019-01-07 DIAGNOSIS — L03116 Cellulitis of left lower limb: Secondary | ICD-10-CM | POA: Diagnosis not present

## 2019-01-07 DIAGNOSIS — I2510 Atherosclerotic heart disease of native coronary artery without angina pectoris: Secondary | ICD-10-CM | POA: Diagnosis not present

## 2019-01-07 DIAGNOSIS — E871 Hypo-osmolality and hyponatremia: Secondary | ICD-10-CM | POA: Diagnosis not present

## 2019-01-08 DIAGNOSIS — I6523 Occlusion and stenosis of bilateral carotid arteries: Secondary | ICD-10-CM | POA: Diagnosis not present

## 2019-01-08 DIAGNOSIS — B351 Tinea unguium: Secondary | ICD-10-CM | POA: Diagnosis not present

## 2019-01-08 DIAGNOSIS — M79675 Pain in left toe(s): Secondary | ICD-10-CM | POA: Diagnosis not present

## 2019-01-08 DIAGNOSIS — L03116 Cellulitis of left lower limb: Secondary | ICD-10-CM | POA: Diagnosis not present

## 2019-01-08 DIAGNOSIS — F0390 Unspecified dementia without behavioral disturbance: Secondary | ICD-10-CM | POA: Diagnosis not present

## 2019-01-08 DIAGNOSIS — S80812D Abrasion, left lower leg, subsequent encounter: Secondary | ICD-10-CM | POA: Diagnosis not present

## 2019-01-08 DIAGNOSIS — E871 Hypo-osmolality and hyponatremia: Secondary | ICD-10-CM | POA: Diagnosis not present

## 2019-01-08 DIAGNOSIS — I2510 Atherosclerotic heart disease of native coronary artery without angina pectoris: Secondary | ICD-10-CM | POA: Diagnosis not present

## 2019-01-08 DIAGNOSIS — M79674 Pain in right toe(s): Secondary | ICD-10-CM | POA: Diagnosis not present

## 2019-01-12 DIAGNOSIS — L03116 Cellulitis of left lower limb: Secondary | ICD-10-CM | POA: Diagnosis not present

## 2019-01-12 DIAGNOSIS — E871 Hypo-osmolality and hyponatremia: Secondary | ICD-10-CM | POA: Diagnosis not present

## 2019-01-12 DIAGNOSIS — F0390 Unspecified dementia without behavioral disturbance: Secondary | ICD-10-CM | POA: Diagnosis not present

## 2019-01-12 DIAGNOSIS — S80812D Abrasion, left lower leg, subsequent encounter: Secondary | ICD-10-CM | POA: Diagnosis not present

## 2019-01-12 DIAGNOSIS — I2510 Atherosclerotic heart disease of native coronary artery without angina pectoris: Secondary | ICD-10-CM | POA: Diagnosis not present

## 2019-01-12 DIAGNOSIS — I6523 Occlusion and stenosis of bilateral carotid arteries: Secondary | ICD-10-CM | POA: Diagnosis not present

## 2019-01-13 DIAGNOSIS — F0390 Unspecified dementia without behavioral disturbance: Secondary | ICD-10-CM | POA: Diagnosis not present

## 2019-01-13 DIAGNOSIS — I6523 Occlusion and stenosis of bilateral carotid arteries: Secondary | ICD-10-CM | POA: Diagnosis not present

## 2019-01-13 DIAGNOSIS — L03116 Cellulitis of left lower limb: Secondary | ICD-10-CM | POA: Diagnosis not present

## 2019-01-13 DIAGNOSIS — E871 Hypo-osmolality and hyponatremia: Secondary | ICD-10-CM | POA: Diagnosis not present

## 2019-01-13 DIAGNOSIS — I2510 Atherosclerotic heart disease of native coronary artery without angina pectoris: Secondary | ICD-10-CM | POA: Diagnosis not present

## 2019-01-13 DIAGNOSIS — S80812D Abrasion, left lower leg, subsequent encounter: Secondary | ICD-10-CM | POA: Diagnosis not present

## 2019-01-14 DIAGNOSIS — L03116 Cellulitis of left lower limb: Secondary | ICD-10-CM | POA: Diagnosis not present

## 2019-01-14 DIAGNOSIS — I6523 Occlusion and stenosis of bilateral carotid arteries: Secondary | ICD-10-CM | POA: Diagnosis not present

## 2019-01-14 DIAGNOSIS — F0390 Unspecified dementia without behavioral disturbance: Secondary | ICD-10-CM | POA: Diagnosis not present

## 2019-01-14 DIAGNOSIS — S80812D Abrasion, left lower leg, subsequent encounter: Secondary | ICD-10-CM | POA: Diagnosis not present

## 2019-01-14 DIAGNOSIS — E871 Hypo-osmolality and hyponatremia: Secondary | ICD-10-CM | POA: Diagnosis not present

## 2019-01-14 DIAGNOSIS — I2510 Atherosclerotic heart disease of native coronary artery without angina pectoris: Secondary | ICD-10-CM | POA: Diagnosis not present

## 2019-01-16 DIAGNOSIS — E871 Hypo-osmolality and hyponatremia: Secondary | ICD-10-CM | POA: Diagnosis not present

## 2019-01-16 DIAGNOSIS — I2510 Atherosclerotic heart disease of native coronary artery without angina pectoris: Secondary | ICD-10-CM | POA: Diagnosis not present

## 2019-01-16 DIAGNOSIS — L03116 Cellulitis of left lower limb: Secondary | ICD-10-CM | POA: Diagnosis not present

## 2019-01-16 DIAGNOSIS — S80812D Abrasion, left lower leg, subsequent encounter: Secondary | ICD-10-CM | POA: Diagnosis not present

## 2019-01-20 DIAGNOSIS — F0390 Unspecified dementia without behavioral disturbance: Secondary | ICD-10-CM | POA: Diagnosis not present

## 2019-01-20 DIAGNOSIS — E871 Hypo-osmolality and hyponatremia: Secondary | ICD-10-CM | POA: Diagnosis not present

## 2019-01-20 DIAGNOSIS — S80812D Abrasion, left lower leg, subsequent encounter: Secondary | ICD-10-CM | POA: Diagnosis not present

## 2019-01-20 DIAGNOSIS — I6523 Occlusion and stenosis of bilateral carotid arteries: Secondary | ICD-10-CM | POA: Diagnosis not present

## 2019-01-20 DIAGNOSIS — I2510 Atherosclerotic heart disease of native coronary artery without angina pectoris: Secondary | ICD-10-CM | POA: Diagnosis not present

## 2019-01-20 DIAGNOSIS — L03116 Cellulitis of left lower limb: Secondary | ICD-10-CM | POA: Diagnosis not present

## 2019-01-22 DIAGNOSIS — E871 Hypo-osmolality and hyponatremia: Secondary | ICD-10-CM | POA: Diagnosis not present

## 2019-01-22 DIAGNOSIS — L03116 Cellulitis of left lower limb: Secondary | ICD-10-CM | POA: Diagnosis not present

## 2019-01-22 DIAGNOSIS — S80812D Abrasion, left lower leg, subsequent encounter: Secondary | ICD-10-CM | POA: Diagnosis not present

## 2019-01-22 DIAGNOSIS — I6523 Occlusion and stenosis of bilateral carotid arteries: Secondary | ICD-10-CM | POA: Diagnosis not present

## 2019-01-22 DIAGNOSIS — F0390 Unspecified dementia without behavioral disturbance: Secondary | ICD-10-CM | POA: Diagnosis not present

## 2019-01-22 DIAGNOSIS — I2510 Atherosclerotic heart disease of native coronary artery without angina pectoris: Secondary | ICD-10-CM | POA: Diagnosis not present

## 2019-01-25 DIAGNOSIS — Z87820 Personal history of traumatic brain injury: Secondary | ICD-10-CM | POA: Diagnosis not present

## 2019-01-25 DIAGNOSIS — M1712 Unilateral primary osteoarthritis, left knee: Secondary | ICD-10-CM | POA: Diagnosis not present

## 2019-01-25 DIAGNOSIS — G609 Hereditary and idiopathic neuropathy, unspecified: Secondary | ICD-10-CM | POA: Diagnosis not present

## 2019-01-25 DIAGNOSIS — F0390 Unspecified dementia without behavioral disturbance: Secondary | ICD-10-CM | POA: Diagnosis not present

## 2019-01-25 DIAGNOSIS — S80812D Abrasion, left lower leg, subsequent encounter: Secondary | ICD-10-CM | POA: Diagnosis not present

## 2019-01-25 DIAGNOSIS — I6523 Occlusion and stenosis of bilateral carotid arteries: Secondary | ICD-10-CM | POA: Diagnosis not present

## 2019-01-25 DIAGNOSIS — Z85828 Personal history of other malignant neoplasm of skin: Secondary | ICD-10-CM | POA: Diagnosis not present

## 2019-01-25 DIAGNOSIS — L03116 Cellulitis of left lower limb: Secondary | ICD-10-CM | POA: Diagnosis not present

## 2019-01-25 DIAGNOSIS — E871 Hypo-osmolality and hyponatremia: Secondary | ICD-10-CM | POA: Diagnosis not present

## 2019-01-25 DIAGNOSIS — R2689 Other abnormalities of gait and mobility: Secondary | ICD-10-CM | POA: Diagnosis not present

## 2019-01-25 DIAGNOSIS — I2510 Atherosclerotic heart disease of native coronary artery without angina pectoris: Secondary | ICD-10-CM | POA: Diagnosis not present

## 2019-01-26 DIAGNOSIS — L03116 Cellulitis of left lower limb: Secondary | ICD-10-CM | POA: Diagnosis not present

## 2019-01-26 DIAGNOSIS — E871 Hypo-osmolality and hyponatremia: Secondary | ICD-10-CM | POA: Diagnosis not present

## 2019-01-26 DIAGNOSIS — F0390 Unspecified dementia without behavioral disturbance: Secondary | ICD-10-CM | POA: Diagnosis not present

## 2019-01-26 DIAGNOSIS — I6523 Occlusion and stenosis of bilateral carotid arteries: Secondary | ICD-10-CM | POA: Diagnosis not present

## 2019-01-26 DIAGNOSIS — S80812D Abrasion, left lower leg, subsequent encounter: Secondary | ICD-10-CM | POA: Diagnosis not present

## 2019-01-26 DIAGNOSIS — I2510 Atherosclerotic heart disease of native coronary artery without angina pectoris: Secondary | ICD-10-CM | POA: Diagnosis not present

## 2019-01-28 DIAGNOSIS — S80812D Abrasion, left lower leg, subsequent encounter: Secondary | ICD-10-CM | POA: Diagnosis not present

## 2019-01-28 DIAGNOSIS — L03116 Cellulitis of left lower limb: Secondary | ICD-10-CM | POA: Diagnosis not present

## 2019-01-28 DIAGNOSIS — F0390 Unspecified dementia without behavioral disturbance: Secondary | ICD-10-CM | POA: Diagnosis not present

## 2019-01-28 DIAGNOSIS — I6523 Occlusion and stenosis of bilateral carotid arteries: Secondary | ICD-10-CM | POA: Diagnosis not present

## 2019-01-28 DIAGNOSIS — E871 Hypo-osmolality and hyponatremia: Secondary | ICD-10-CM | POA: Diagnosis not present

## 2019-01-28 DIAGNOSIS — I2510 Atherosclerotic heart disease of native coronary artery without angina pectoris: Secondary | ICD-10-CM | POA: Diagnosis not present

## 2019-02-03 DIAGNOSIS — F0390 Unspecified dementia without behavioral disturbance: Secondary | ICD-10-CM | POA: Diagnosis not present

## 2019-02-03 DIAGNOSIS — I2510 Atherosclerotic heart disease of native coronary artery without angina pectoris: Secondary | ICD-10-CM | POA: Diagnosis not present

## 2019-02-03 DIAGNOSIS — L03116 Cellulitis of left lower limb: Secondary | ICD-10-CM | POA: Diagnosis not present

## 2019-02-03 DIAGNOSIS — S80812D Abrasion, left lower leg, subsequent encounter: Secondary | ICD-10-CM | POA: Diagnosis not present

## 2019-02-03 DIAGNOSIS — E871 Hypo-osmolality and hyponatremia: Secondary | ICD-10-CM | POA: Diagnosis not present

## 2019-02-03 DIAGNOSIS — I6523 Occlusion and stenosis of bilateral carotid arteries: Secondary | ICD-10-CM | POA: Diagnosis not present

## 2019-02-04 DIAGNOSIS — E871 Hypo-osmolality and hyponatremia: Secondary | ICD-10-CM | POA: Diagnosis not present

## 2019-02-04 DIAGNOSIS — L03116 Cellulitis of left lower limb: Secondary | ICD-10-CM | POA: Diagnosis not present

## 2019-02-04 DIAGNOSIS — F0390 Unspecified dementia without behavioral disturbance: Secondary | ICD-10-CM | POA: Diagnosis not present

## 2019-02-04 DIAGNOSIS — I2510 Atherosclerotic heart disease of native coronary artery without angina pectoris: Secondary | ICD-10-CM | POA: Diagnosis not present

## 2019-02-04 DIAGNOSIS — I6523 Occlusion and stenosis of bilateral carotid arteries: Secondary | ICD-10-CM | POA: Diagnosis not present

## 2019-02-04 DIAGNOSIS — S80812D Abrasion, left lower leg, subsequent encounter: Secondary | ICD-10-CM | POA: Diagnosis not present

## 2019-02-09 DIAGNOSIS — E871 Hypo-osmolality and hyponatremia: Secondary | ICD-10-CM | POA: Diagnosis not present

## 2019-02-09 DIAGNOSIS — I2510 Atherosclerotic heart disease of native coronary artery without angina pectoris: Secondary | ICD-10-CM | POA: Diagnosis not present

## 2019-02-09 DIAGNOSIS — F0390 Unspecified dementia without behavioral disturbance: Secondary | ICD-10-CM | POA: Diagnosis not present

## 2019-02-09 DIAGNOSIS — L03116 Cellulitis of left lower limb: Secondary | ICD-10-CM | POA: Diagnosis not present

## 2019-02-09 DIAGNOSIS — S80812D Abrasion, left lower leg, subsequent encounter: Secondary | ICD-10-CM | POA: Diagnosis not present

## 2019-02-09 DIAGNOSIS — I6523 Occlusion and stenosis of bilateral carotid arteries: Secondary | ICD-10-CM | POA: Diagnosis not present

## 2019-02-12 ENCOUNTER — Emergency Department (HOSPITAL_COMMUNITY)
Admission: EM | Admit: 2019-02-12 | Discharge: 2019-02-12 | Discharge status: Absent without leave | Payer: Medicare Other | Source: Home / Self Care

## 2019-02-12 ENCOUNTER — Other Ambulatory Visit: Payer: Self-pay

## 2019-02-12 ENCOUNTER — Emergency Department (HOSPITAL_COMMUNITY): Payer: Medicare Other

## 2019-02-12 ENCOUNTER — Encounter (HOSPITAL_COMMUNITY): Payer: Self-pay | Admitting: Emergency Medicine

## 2019-02-12 ENCOUNTER — Emergency Department (HOSPITAL_COMMUNITY)
Admission: EM | Admit: 2019-02-12 | Discharge: 2019-02-12 | Disposition: A | Discharge status: Home / Self Care | Payer: Medicare Other | Attending: Emergency Medicine | Admitting: Emergency Medicine

## 2019-02-12 DIAGNOSIS — I959 Hypotension, unspecified: Secondary | ICD-10-CM | POA: Diagnosis not present

## 2019-02-12 DIAGNOSIS — I110 Hypertensive heart disease with heart failure: Secondary | ICD-10-CM | POA: Insufficient documentation

## 2019-02-12 DIAGNOSIS — I2510 Atherosclerotic heart disease of native coronary artery without angina pectoris: Secondary | ICD-10-CM | POA: Diagnosis not present

## 2019-02-12 DIAGNOSIS — R05 Cough: Secondary | ICD-10-CM | POA: Diagnosis not present

## 2019-02-12 DIAGNOSIS — Z03818 Encounter for observation for suspected exposure to other biological agents ruled out: Secondary | ICD-10-CM | POA: Diagnosis not present

## 2019-02-12 DIAGNOSIS — F0390 Unspecified dementia without behavioral disturbance: Secondary | ICD-10-CM | POA: Insufficient documentation

## 2019-02-12 DIAGNOSIS — R404 Transient alteration of awareness: Secondary | ICD-10-CM | POA: Diagnosis not present

## 2019-02-12 DIAGNOSIS — R600 Localized edema: Secondary | ICD-10-CM | POA: Diagnosis not present

## 2019-02-12 DIAGNOSIS — Z20828 Contact with and (suspected) exposure to other viral communicable diseases: Secondary | ICD-10-CM | POA: Insufficient documentation

## 2019-02-12 DIAGNOSIS — R609 Edema, unspecified: Secondary | ICD-10-CM | POA: Diagnosis not present

## 2019-02-12 DIAGNOSIS — I509 Heart failure, unspecified: Secondary | ICD-10-CM | POA: Insufficient documentation

## 2019-02-12 DIAGNOSIS — E039 Hypothyroidism, unspecified: Secondary | ICD-10-CM | POA: Insufficient documentation

## 2019-02-12 DIAGNOSIS — R4182 Altered mental status, unspecified: Secondary | ICD-10-CM | POA: Diagnosis present

## 2019-02-12 DIAGNOSIS — I10 Essential (primary) hypertension: Secondary | ICD-10-CM | POA: Diagnosis not present

## 2019-02-12 DIAGNOSIS — R2243 Localized swelling, mass and lump, lower limb, bilateral: Secondary | ICD-10-CM | POA: Diagnosis not present

## 2019-02-12 LAB — URINALYSIS, ROUTINE W REFLEX MICROSCOPIC
Bilirubin Urine: NEGATIVE
Glucose, UA: NEGATIVE mg/dL
Hgb urine dipstick: NEGATIVE
Ketones, ur: NEGATIVE mg/dL
Nitrite: NEGATIVE
Protein, ur: NEGATIVE mg/dL
Specific Gravity, Urine: 1.012 (ref 1.005–1.030)
pH: 6 (ref 5.0–8.0)

## 2019-02-12 LAB — CBC WITH DIFFERENTIAL/PLATELET
Abs Immature Granulocytes: 0.02 10*3/uL (ref 0.00–0.07)
Basophils Absolute: 0 10*3/uL (ref 0.0–0.1)
Basophils Relative: 1 %
Eosinophils Absolute: 0.1 10*3/uL (ref 0.0–0.5)
Eosinophils Relative: 2 %
HCT: 36.4 % (ref 36.0–46.0)
Hemoglobin: 10.5 g/dL — ABNORMAL LOW (ref 12.0–15.0)
Immature Granulocytes: 1 %
Lymphocytes Relative: 29 %
Lymphs Abs: 1.1 10*3/uL (ref 0.7–4.0)
MCH: 28.7 pg (ref 26.0–34.0)
MCHC: 28.8 g/dL — ABNORMAL LOW (ref 30.0–36.0)
MCV: 99.5 fL (ref 80.0–100.0)
Monocytes Absolute: 0.5 10*3/uL (ref 0.1–1.0)
Monocytes Relative: 13 %
Neutro Abs: 2.1 10*3/uL (ref 1.7–7.7)
Neutrophils Relative %: 54 %
Platelets: 138 10*3/uL — ABNORMAL LOW (ref 150–400)
RBC: 3.66 MIL/uL — ABNORMAL LOW (ref 3.87–5.11)
RDW: 13.9 % (ref 11.5–15.5)
WBC: 3.8 10*3/uL — ABNORMAL LOW (ref 4.0–10.5)
nRBC: 0 % (ref 0.0–0.2)

## 2019-02-12 LAB — SARS CORONAVIRUS 2 BY RT PCR (HOSPITAL ORDER, PERFORMED IN ~~LOC~~ HOSPITAL LAB): SARS Coronavirus 2: NEGATIVE

## 2019-02-12 LAB — COMPREHENSIVE METABOLIC PANEL
ALT: 14 U/L (ref 0–44)
AST: 23 U/L (ref 15–41)
Albumin: 3.5 g/dL (ref 3.5–5.0)
Alkaline Phosphatase: 41 U/L (ref 38–126)
Anion gap: 13 (ref 5–15)
BUN: 16 mg/dL (ref 8–23)
CO2: 34 mmol/L — ABNORMAL HIGH (ref 22–32)
Calcium: 9.1 mg/dL (ref 8.9–10.3)
Chloride: 89 mmol/L — ABNORMAL LOW (ref 98–111)
Creatinine, Ser: 0.83 mg/dL (ref 0.44–1.00)
GFR calc Af Amer: >60 mL/min (ref >60)
GFR calc non Af Amer: >60 mL/min (ref >60)
Glucose, Bld: 114 mg/dL — ABNORMAL HIGH (ref 70–99)
Potassium: 5.4 mmol/L — ABNORMAL HIGH (ref 3.5–5.1)
Sodium: 136 mmol/L (ref 135–145)
Total Bilirubin: 0.6 mg/dL (ref 0.3–1.2)
Total Protein: 6.5 g/dL (ref 6.5–8.1)

## 2019-02-12 LAB — BRAIN NATRIURETIC PEPTIDE: B Natriuretic Peptide: 623 pg/mL — ABNORMAL HIGH (ref 0.0–100.0)

## 2019-02-12 LAB — TROPONIN I: Troponin I: <0.03 ng/mL (ref <0.03)

## 2019-02-12 LAB — LIPASE, BLOOD: Lipase: 24 U/L (ref 11–51)

## 2019-02-12 MED ORDER — FUROSEMIDE 10 MG/ML IJ SOLN
40.0000 mg | Freq: Once | INTRAMUSCULAR | Status: AC
  Administered 2019-02-12: 40 mg via INTRAVENOUS
  Filled 2019-02-12: qty 4

## 2019-02-12 MED ORDER — FUROSEMIDE 40 MG PO TABS: 40.0000 mg | ORAL_TABLET | Freq: Every day | ORAL | 0 refills | Status: DC

## 2019-02-12 NOTE — ED Triage Notes (Signed)
Pt sent [facility] for increased aloc and increased swelling

## 2019-02-12 NOTE — Discharge Instructions (Signed)
You were seen in the emergency department today with lower extremity swelling.  I am temporarily increasing her Lasix to 40 mg/day.  We gave today's dose.  You can take 40 mg starting tomorrow for 4 more days and then return to the 20 mg/day dosing.  Your primary care physician to schedule a follow-up appointment with repeat lab testing.  Return to the emergency department with any worsening confusion, shortness of breath, low oxygen levels, or other sudden worsening symptoms.

## 2019-02-12 NOTE — ED Provider Notes (Signed)
Emergency Department Provider Note   I have reviewed the triage vital signs and the nursing notes.   HISTORY  Chief Complaint Altered Mental Status   HPI Lisa Kemp is a 85 y.o. female with PMH reviewed below is to the emergency department from [facility] with increased confusion and lower extremity swelling.  Symptoms began this morning.  According to EMS patient has been somewhat more confused than normal.  Staff note increased swelling in the bilateral lower extremities.  No recent falls or reported head trauma.  No fevers.  Patient wears 2 L of nasal cannula oxygen at baseline.  She has noticed some recent cough which is dry and nonproductive.  Denies body aches or sore throat.  No report of changes to medications.   Level 5 caveat: Dementia   Past Medical History:  Diagnosis Date  . Arthritis   . CHF (congestive heart failure) ([place])   . Colon polyps   . Coronary atherosclerosis of native coronary artery    Nonobstructive at catherization 2006  . Dementia ([place])   . Esophageal ring    Distal esophageal ring status post dilation  . Gastrointestinal bleed    Related to NSAIDs  . GERD (gastroesophageal reflux disease)   . Hiatal hernia    Moderate sized sliding hiatal hernia  . Hypertension   . Hypothyroidism   . Intracranial hemorrhage ([place])    Left subcortical 2015  . Mixed hyperlipidemia    Statin intolerant  . Neuropathy     Patient Active Problem List   Diagnosis Date Noted  . Hyponatremia 09/12/2018  . HCAP (healthcare-associated pneumonia) 09/11/2018  . Hypothyroidism 09/11/2018  . GERD (gastroesophageal reflux disease) 09/11/2018  . Dementia ([place]) 09/11/2018  . Elevated troponin 09/11/2018  . Carotid atherosclerosis 06/11/2014  . Osteoarthritis of left knee 02/17/2014  . SDH (subdural hematoma) ([place]) 12/25/2013  . Intracerebral hemorrhage ([place]) 12/22/2013  . Essential hypertension, benign 08/01/2009  . Mixed hyperlipidemia 06/12/2009  .  CAD, NATIVE VESSEL 06/12/2009    Past Surgical History:  Procedure Laterality Date  . APPENDECTOMY    . CATARACT EXTRACTION    . COLON SURGERY    . TOTAL ABDOMINAL HYSTERECTOMY    . TOTAL ABDOMINAL HYSTERECTOMY W/ BILATERAL SALPINGOOPHORECTOMY      Allergies Diazepam and Statins  Family History  Problem Relation Age of Onset  . Heart attack Mother   . Heart attack Father   . Hypertension Other     Social History Social History   Tobacco Use  . Smoking status: Never Smoker  . Smokeless tobacco: Never Used  Substance Use Topics  . Alcohol use: No    Alcohol/week: 0.0 standard drinks    Comment: 06/11/14 -- "Never tasted a beer, wine, or a whiskey.." - AJ  . Drug use: No    Review of Systems  Constitutional: No fever/chills Eyes: No visual changes. ENT: No sore throat. Cardiovascular: Denies chest pain. Positive LE swelling.  Respiratory: Denies shortness of breath. Positive cough.  Gastrointestinal: No abdominal pain.  No nausea, no vomiting.  No diarrhea.  No constipation. Genitourinary: Negative for dysuria. Musculoskeletal: Negative for back pain. Skin: Negative for rash. Neurological: Negative for headaches, focal weakness or numbness.  10-point ROS otherwise negative.  ____________________________________________   PHYSICAL EXAM:  VITAL SIGNS: ED Triage Vitals  Enc Vitals Group     BP 02/12/19 0901 125/80     Pulse Rate 02/12/19 0901 70     Resp 02/12/19 0901 18  Temp 02/12/19 0901 97.8 F (36.6 C)     Temp Source 02/12/19 0901 Oral     SpO2 02/12/19 0901 97 %     Weight 02/12/19 0853 148 lb 13 oz (67.5 kg)     Height 02/12/19 0853 5\' 3"  (1.6 m)   Constitutional: Alert and oriented. Well appearing and in no acute distress. Eyes: Conjunctivae are normal.  Head: Atraumatic. Nose: No congestion/rhinnorhea. Mouth/Throat: Mucous membranes are moist.  Neck: No stridor.   Cardiovascular: Normal rate, regular rhythm. Good peripheral circulation.  Grossly normal heart sounds.   Respiratory: Normal respiratory effort.  No retractions. Lungs CTAB. Gastrointestinal: Soft and nontender. No distention.  Musculoskeletal: LE with chronic venous stasis type changes with 2+ pitting edema bilaterally.  Neurologic:  Normal speech and language. No gross focal neurologic deficits are appreciated.  Skin:  Skin is warm and dry. Venous stasis changes in the bilateral LEs. Bruising discoloration to the #3 toe on the left. Non-tender to palpation.   ____________________________________________   LABS (all labs ordered are listed, but only abnormal results are displayed)  Labs Reviewed  BRAIN NATRIURETIC PEPTIDE - Abnormal; Notable for the following components:      Result Value   B Natriuretic Peptide 623.0 (*)    All other components within normal limits  COMPREHENSIVE METABOLIC PANEL - Abnormal; Notable for the following components:   Potassium 5.4 (*)    Chloride 89 (*)    CO2 34 (*)    Glucose, Bld 114 (*)    All other components within normal limits  CBC WITH DIFFERENTIAL/PLATELET - Abnormal; Notable for the following components:   WBC 3.8 (*)    RBC 3.66 (*)    Hemoglobin 10.5 (*)    MCHC 28.8 (*)    Platelets 138 (*)    All other components within normal limits  URINALYSIS, ROUTINE W REFLEX MICROSCOPIC - Abnormal; Notable for the following components:   APPearance HAZY (*)    Leukocytes,Ua TRACE (*)    Bacteria, UA RARE (*)    All other components within normal limits  SARS CORONAVIRUS 2 (HOSPITAL ORDER, [place] [facility])  URINE CULTURE  LIPASE, BLOOD  TROPONIN I   ____________________________________________  EKG   EKG Interpretation  Date/Time:  Thursday February 12 2019 08:58:46 EDT Ventricular Rate:  71 PR Interval:    QRS Duration: 109 QT Interval:  405 QTC Calculation: 441 R Axis:   102 Text Interpretation:  Sinus rhythm Short PR interval Right axis deviation Artifact in lead(s) I II III aVR aVL  V1 V2 No STEMI  Confirmed by Nanda Quinton [phone]) on 02/12/2019 9:06:36 AM       ____________________________________________  RADIOLOGY  Dg Chest Portable 1 View  Result Date: 02/12/2019 CLINICAL DATA:  Increasing swelling.  Cough. EXAM: PORTABLE CHEST 1 VIEW COMPARISON:  September 11, 2018 FINDINGS: Stable cardiomegaly. The hila and mediastinum are unremarkable. Bilateral pleural effusions with underlying atelectasis. Mild pulmonary venous congestion. IMPRESSION: 1. Bilateral pleural effusions with underlying opacities, likely atelectasis. Pulmonary venous congestion. Electronically Signed   By: Dorise Bullion III M.D   On: 02/12/2019 09:45    ____________________________________________   PROCEDURES  Procedure(s) performed:   Procedures  None  ____________________________________________   INITIAL IMPRESSION / ASSESSMENT AND PLAN / ED COURSE  Pertinent labs & imaging results that were available during my care of the patient were reviewed by me and considered in my medical decision making (see chart for details).   Patient presents to the emergency  department for increased fluid retention and confusion.  Patient does have history of dementia.  She is conversational with me.  Vital signs are normal.  Patient does have some bruising discoloration to the third toe on the left along with cough.  Plan for rapid COVID testing along with chest x-ray, labs, and reassess.  Patient without focal neurologic deficits.  She does not appear acutely volume overloaded.   Lab work reviewed.  Renal function normal.  Seems slightly high.  No evidence of UTI on UA.  Some mild edema on chest x-ray but patient without significant symptoms.  She is on her home oxygen without respiratory distress.  I do plan to increase her Lasix over the next 5 days.  First dose given IV here in the emergency department.  Patient is awake, alert, and talkative.  Do not feel she requires admission for altered mental status.   ____________________________________________  FINAL CLINICAL IMPRESSION(S) / ED DIAGNOSES  Final diagnoses:  Transient alteration of awareness  Lower extremity edema     MEDICATIONS GIVEN DURING THIS VISIT:  Medications  furosemide (LASIX) injection 40 mg (40 mg Intravenous Given 02/12/19 1044)     NEW OUTPATIENT MEDICATIONS STARTED DURING THIS VISIT:  New Prescriptions   FUROSEMIDE (LASIX) 40 MG TABLET    Take 1 tablet (40 mg total) by mouth daily for 4 days.    Note:  This document was prepared using Dragon voice recognition software and may include unintentional dictation errors.  Nanda Quinton, MD Emergency Medicine    Long, Wonda Olds, MD 02/12/19 [phone]

## 2019-02-13 DIAGNOSIS — I2510 Atherosclerotic heart disease of native coronary artery without angina pectoris: Secondary | ICD-10-CM | POA: Diagnosis not present

## 2019-02-13 DIAGNOSIS — L03116 Cellulitis of left lower limb: Secondary | ICD-10-CM | POA: Diagnosis not present

## 2019-02-13 DIAGNOSIS — S80812D Abrasion, left lower leg, subsequent encounter: Secondary | ICD-10-CM | POA: Diagnosis not present

## 2019-02-13 DIAGNOSIS — E871 Hypo-osmolality and hyponatremia: Secondary | ICD-10-CM | POA: Diagnosis not present

## 2019-02-13 DIAGNOSIS — I6523 Occlusion and stenosis of bilateral carotid arteries: Secondary | ICD-10-CM | POA: Diagnosis not present

## 2019-02-13 DIAGNOSIS — F0390 Unspecified dementia without behavioral disturbance: Secondary | ICD-10-CM | POA: Diagnosis not present

## 2019-02-14 LAB — URINE CULTURE
Culture: 50000 — AB
Special Requests: NORMAL

## 2019-02-15 ENCOUNTER — Telehealth: Payer: Self-pay | Admitting: Emergency Medicine

## 2019-02-15 NOTE — Telephone Encounter (Signed)
Post ED Visit - Positive Culture Follow-up  Culture report reviewed by antimicrobial stewardship pharmacist: [place] Team []  Elenor Quinones, Pharm.D. []  Heide Guile, Pharm.D., BCPS AQ-ID []  Parks Neptune, Pharm.D., BCPS []  Alycia Rossetti, Pharm.D., BCPS []  [place], [place].D., BCPS, AAHIVP []  Legrand Como, Pharm.D., BCPS, AAHIVP []  Salome Arnt, PharmD, BCPS []  Johnnette Gourd, PharmD, BCPS []  Hughes Better, PharmD, BCPS [x]  Gorden Harms, PharmD []  Laqueta Linden, PharmD, BCPS []  Albertina Parr, PharmD  [place] Team []  Leodis Sias, PharmD []  Lindell Spar, PharmD []  Royetta Asal, PharmD []  Graylin Shiver, Rph []  Rema Fendt) Glennon Mac, PharmD []  Arlyn Dunning, PharmD []  Netta Cedars, PharmD []  Dia Sitter, PharmD []  Leone Haven, PharmD []  Gretta Arab, PharmD []  Theodis Shove, PharmD []  Peggyann Juba, PharmD []  Reuel Boom, PharmD   Positive urine culture No further patient follow-up is required at this time.  Lisa Kemp 02/15/2019, 5:42 PM

## 2019-02-19 DIAGNOSIS — E871 Hypo-osmolality and hyponatremia: Secondary | ICD-10-CM | POA: Diagnosis not present

## 2019-02-19 DIAGNOSIS — S80812D Abrasion, left lower leg, subsequent encounter: Secondary | ICD-10-CM | POA: Diagnosis not present

## 2019-02-19 DIAGNOSIS — F0390 Unspecified dementia without behavioral disturbance: Secondary | ICD-10-CM | POA: Diagnosis not present

## 2019-02-19 DIAGNOSIS — I2510 Atherosclerotic heart disease of native coronary artery without angina pectoris: Secondary | ICD-10-CM | POA: Diagnosis not present

## 2019-02-19 DIAGNOSIS — L03116 Cellulitis of left lower limb: Secondary | ICD-10-CM | POA: Diagnosis not present

## 2019-02-19 DIAGNOSIS — I6523 Occlusion and stenosis of bilateral carotid arteries: Secondary | ICD-10-CM | POA: Diagnosis not present

## 2019-02-23 DIAGNOSIS — S80812D Abrasion, left lower leg, subsequent encounter: Secondary | ICD-10-CM | POA: Diagnosis not present

## 2019-02-23 DIAGNOSIS — F0390 Unspecified dementia without behavioral disturbance: Secondary | ICD-10-CM | POA: Diagnosis not present

## 2019-02-23 DIAGNOSIS — I2510 Atherosclerotic heart disease of native coronary artery without angina pectoris: Secondary | ICD-10-CM | POA: Diagnosis not present

## 2019-02-23 DIAGNOSIS — E871 Hypo-osmolality and hyponatremia: Secondary | ICD-10-CM | POA: Diagnosis not present

## 2019-02-23 DIAGNOSIS — I6523 Occlusion and stenosis of bilateral carotid arteries: Secondary | ICD-10-CM | POA: Diagnosis not present

## 2019-02-23 DIAGNOSIS — L03116 Cellulitis of left lower limb: Secondary | ICD-10-CM | POA: Diagnosis not present

## 2019-02-24 DIAGNOSIS — L03116 Cellulitis of left lower limb: Secondary | ICD-10-CM | POA: Diagnosis not present

## 2019-02-24 DIAGNOSIS — G609 Hereditary and idiopathic neuropathy, unspecified: Secondary | ICD-10-CM | POA: Diagnosis not present

## 2019-02-24 DIAGNOSIS — I6523 Occlusion and stenosis of bilateral carotid arteries: Secondary | ICD-10-CM | POA: Diagnosis not present

## 2019-02-24 DIAGNOSIS — J449 Chronic obstructive pulmonary disease, unspecified: Secondary | ICD-10-CM | POA: Diagnosis not present

## 2019-02-24 DIAGNOSIS — M1712 Unilateral primary osteoarthritis, left knee: Secondary | ICD-10-CM | POA: Diagnosis not present

## 2019-02-24 DIAGNOSIS — Z85828 Personal history of other malignant neoplasm of skin: Secondary | ICD-10-CM | POA: Diagnosis not present

## 2019-02-24 DIAGNOSIS — I2510 Atherosclerotic heart disease of native coronary artery without angina pectoris: Secondary | ICD-10-CM | POA: Diagnosis not present

## 2019-02-24 DIAGNOSIS — S80812D Abrasion, left lower leg, subsequent encounter: Secondary | ICD-10-CM | POA: Diagnosis not present

## 2019-02-24 DIAGNOSIS — Z87820 Personal history of traumatic brain injury: Secondary | ICD-10-CM | POA: Diagnosis not present

## 2019-02-24 DIAGNOSIS — E871 Hypo-osmolality and hyponatremia: Secondary | ICD-10-CM | POA: Diagnosis not present

## 2019-02-24 DIAGNOSIS — R2689 Other abnormalities of gait and mobility: Secondary | ICD-10-CM | POA: Diagnosis not present

## 2019-02-24 DIAGNOSIS — F0390 Unspecified dementia without behavioral disturbance: Secondary | ICD-10-CM | POA: Diagnosis not present

## 2019-02-25 DIAGNOSIS — L03116 Cellulitis of left lower limb: Secondary | ICD-10-CM | POA: Diagnosis not present

## 2019-02-25 DIAGNOSIS — E871 Hypo-osmolality and hyponatremia: Secondary | ICD-10-CM | POA: Diagnosis not present

## 2019-02-25 DIAGNOSIS — S80812D Abrasion, left lower leg, subsequent encounter: Secondary | ICD-10-CM | POA: Diagnosis not present

## 2019-02-25 DIAGNOSIS — F0390 Unspecified dementia without behavioral disturbance: Secondary | ICD-10-CM | POA: Diagnosis not present

## 2019-02-25 DIAGNOSIS — I2510 Atherosclerotic heart disease of native coronary artery without angina pectoris: Secondary | ICD-10-CM | POA: Diagnosis not present

## 2019-02-25 DIAGNOSIS — J449 Chronic obstructive pulmonary disease, unspecified: Secondary | ICD-10-CM | POA: Diagnosis not present

## 2019-03-02 DIAGNOSIS — E871 Hypo-osmolality and hyponatremia: Secondary | ICD-10-CM | POA: Diagnosis not present

## 2019-03-02 DIAGNOSIS — S80812D Abrasion, left lower leg, subsequent encounter: Secondary | ICD-10-CM | POA: Diagnosis not present

## 2019-03-02 DIAGNOSIS — J449 Chronic obstructive pulmonary disease, unspecified: Secondary | ICD-10-CM | POA: Diagnosis not present

## 2019-03-02 DIAGNOSIS — I2510 Atherosclerotic heart disease of native coronary artery without angina pectoris: Secondary | ICD-10-CM | POA: Diagnosis not present

## 2019-03-02 DIAGNOSIS — L03116 Cellulitis of left lower limb: Secondary | ICD-10-CM | POA: Diagnosis not present

## 2019-03-02 DIAGNOSIS — F0390 Unspecified dementia without behavioral disturbance: Secondary | ICD-10-CM | POA: Diagnosis not present

## 2019-03-03 DIAGNOSIS — L03116 Cellulitis of left lower limb: Secondary | ICD-10-CM | POA: Diagnosis not present

## 2019-03-03 DIAGNOSIS — N390 Urinary tract infection, site not specified: Secondary | ICD-10-CM | POA: Diagnosis not present

## 2019-03-03 DIAGNOSIS — I2510 Atherosclerotic heart disease of native coronary artery without angina pectoris: Secondary | ICD-10-CM | POA: Diagnosis not present

## 2019-03-03 DIAGNOSIS — J449 Chronic obstructive pulmonary disease, unspecified: Secondary | ICD-10-CM | POA: Diagnosis not present

## 2019-03-03 DIAGNOSIS — E871 Hypo-osmolality and hyponatremia: Secondary | ICD-10-CM | POA: Diagnosis not present

## 2019-03-03 DIAGNOSIS — F0390 Unspecified dementia without behavioral disturbance: Secondary | ICD-10-CM | POA: Diagnosis not present

## 2019-03-03 DIAGNOSIS — S80812D Abrasion, left lower leg, subsequent encounter: Secondary | ICD-10-CM | POA: Diagnosis not present

## 2019-03-04 ENCOUNTER — Emergency Department (HOSPITAL_COMMUNITY): Payer: Medicare Other

## 2019-03-04 ENCOUNTER — Encounter (HOSPITAL_COMMUNITY): Payer: Self-pay

## 2019-03-04 ENCOUNTER — Inpatient Hospital Stay (HOSPITAL_COMMUNITY)
Admission: EM | Admit: 2019-03-04 | Discharge: 2019-03-06 | DRG: 292 | Disposition: A | Discharge status: Home Health | Payer: Medicare Other | Source: Skilled Nursing Facility | Attending: Internal Medicine | Admitting: Internal Medicine

## 2019-03-04 ENCOUNTER — Other Ambulatory Visit: Payer: Self-pay

## 2019-03-04 DIAGNOSIS — R443 Hallucinations, unspecified: Secondary | ICD-10-CM | POA: Diagnosis not present

## 2019-03-04 DIAGNOSIS — Z66 Do not resuscitate: Secondary | ICD-10-CM | POA: Diagnosis not present

## 2019-03-04 DIAGNOSIS — Z90722 Acquired absence of ovaries, bilateral: Secondary | ICD-10-CM

## 2019-03-04 DIAGNOSIS — I10 Essential (primary) hypertension: Secondary | ICD-10-CM | POA: Diagnosis not present

## 2019-03-04 DIAGNOSIS — R52 Pain, unspecified: Secondary | ICD-10-CM | POA: Diagnosis not present

## 2019-03-04 DIAGNOSIS — Z86010 Personal history of colonic polyps: Secondary | ICD-10-CM

## 2019-03-04 DIAGNOSIS — Z90710 Acquired absence of both cervix and uterus: Secondary | ICD-10-CM

## 2019-03-04 DIAGNOSIS — I5020 Unspecified systolic (congestive) heart failure: Secondary | ICD-10-CM

## 2019-03-04 DIAGNOSIS — Z20828 Contact with and (suspected) exposure to other viral communicable diseases: Secondary | ICD-10-CM | POA: Diagnosis not present

## 2019-03-04 DIAGNOSIS — I5033 Acute on chronic diastolic (congestive) heart failure: Secondary | ICD-10-CM | POA: Diagnosis present

## 2019-03-04 DIAGNOSIS — M1990 Unspecified osteoarthritis, unspecified site: Secondary | ICD-10-CM | POA: Diagnosis present

## 2019-03-04 DIAGNOSIS — I2510 Atherosclerotic heart disease of native coronary artery without angina pectoris: Secondary | ICD-10-CM | POA: Diagnosis present

## 2019-03-04 DIAGNOSIS — I509 Heart failure, unspecified: Secondary | ICD-10-CM | POA: Diagnosis not present

## 2019-03-04 DIAGNOSIS — D649 Anemia, unspecified: Secondary | ICD-10-CM | POA: Diagnosis present

## 2019-03-04 DIAGNOSIS — Z79899 Other long term (current) drug therapy: Secondary | ICD-10-CM

## 2019-03-04 DIAGNOSIS — Z8249 Family history of ischemic heart disease and other diseases of the circulatory system: Secondary | ICD-10-CM

## 2019-03-04 DIAGNOSIS — Z7982 Long term (current) use of aspirin: Secondary | ICD-10-CM

## 2019-03-04 DIAGNOSIS — F0390 Unspecified dementia without behavioral disturbance: Secondary | ICD-10-CM | POA: Diagnosis present

## 2019-03-04 DIAGNOSIS — I272 Pulmonary hypertension, unspecified: Secondary | ICD-10-CM | POA: Diagnosis present

## 2019-03-04 DIAGNOSIS — I5031 Acute diastolic (congestive) heart failure: Secondary | ICD-10-CM | POA: Diagnosis present

## 2019-03-04 DIAGNOSIS — R0602 Shortness of breath: Secondary | ICD-10-CM | POA: Diagnosis not present

## 2019-03-04 DIAGNOSIS — Z9981 Dependence on supplemental oxygen: Secondary | ICD-10-CM

## 2019-03-04 DIAGNOSIS — E782 Mixed hyperlipidemia: Secondary | ICD-10-CM | POA: Diagnosis present

## 2019-03-04 DIAGNOSIS — Z888 Allergy status to other drugs, medicaments and biological substances status: Secondary | ICD-10-CM

## 2019-03-04 DIAGNOSIS — K219 Gastro-esophageal reflux disease without esophagitis: Secondary | ICD-10-CM | POA: Diagnosis present

## 2019-03-04 DIAGNOSIS — E039 Hypothyroidism, unspecified: Secondary | ICD-10-CM | POA: Diagnosis present

## 2019-03-04 DIAGNOSIS — R609 Edema, unspecified: Secondary | ICD-10-CM | POA: Diagnosis not present

## 2019-03-04 DIAGNOSIS — R410 Disorientation, unspecified: Secondary | ICD-10-CM | POA: Diagnosis not present

## 2019-03-04 DIAGNOSIS — Z79890 Hormone replacement therapy (postmenopausal): Secondary | ICD-10-CM

## 2019-03-04 DIAGNOSIS — I110 Hypertensive heart disease with heart failure: Principal | ICD-10-CM | POA: Diagnosis present

## 2019-03-04 DIAGNOSIS — D696 Thrombocytopenia, unspecified: Secondary | ICD-10-CM | POA: Diagnosis present

## 2019-03-04 DIAGNOSIS — Z8679 Personal history of other diseases of the circulatory system: Secondary | ICD-10-CM

## 2019-03-04 DIAGNOSIS — G629 Polyneuropathy, unspecified: Secondary | ICD-10-CM | POA: Diagnosis present

## 2019-03-04 LAB — CBC WITH DIFFERENTIAL/PLATELET
Abs Immature Granulocytes: 0.01 10*3/uL (ref 0.00–0.07)
Basophils Absolute: 0 10*3/uL (ref 0.0–0.1)
Basophils Relative: 1 %
Eosinophils Absolute: 0.1 10*3/uL (ref 0.0–0.5)
Eosinophils Relative: 3 %
HCT: 35 % — ABNORMAL LOW (ref 36.0–46.0)
Hemoglobin: 10.1 g/dL — ABNORMAL LOW (ref 12.0–15.0)
Immature Granulocytes: 0 %
Lymphocytes Relative: 26 %
Lymphs Abs: 1.1 10*3/uL (ref 0.7–4.0)
MCH: 28.8 pg (ref 26.0–34.0)
MCHC: 28.9 g/dL — ABNORMAL LOW (ref 30.0–36.0)
MCV: 99.7 fL (ref 80.0–100.0)
Monocytes Absolute: 0.4 10*3/uL (ref 0.1–1.0)
Monocytes Relative: 9 %
Neutro Abs: 2.5 10*3/uL (ref 1.7–7.7)
Neutrophils Relative %: 61 %
Platelets: 141 10*3/uL — ABNORMAL LOW (ref 150–400)
RBC: 3.51 MIL/uL — ABNORMAL LOW (ref 3.87–5.11)
RDW: 14.4 % (ref 11.5–15.5)
WBC: 4.1 10*3/uL (ref 4.0–10.5)
nRBC: 0 % (ref 0.0–0.2)

## 2019-03-04 LAB — COMPREHENSIVE METABOLIC PANEL
ALT: 17 U/L (ref 0–44)
AST: 21 U/L (ref 15–41)
Albumin: 3.7 g/dL (ref 3.5–5.0)
Alkaline Phosphatase: 44 U/L (ref 38–126)
Anion gap: 6 (ref 5–15)
BUN: 26 mg/dL — ABNORMAL HIGH (ref 8–23)
CO2: 37 mmol/L — ABNORMAL HIGH (ref 22–32)
Calcium: 8.9 mg/dL (ref 8.9–10.3)
Chloride: 92 mmol/L — ABNORMAL LOW (ref 98–111)
Creatinine, Ser: 0.82 mg/dL (ref 0.44–1.00)
GFR calc Af Amer: >60 mL/min (ref >60)
GFR calc non Af Amer: >60 mL/min (ref >60)
Glucose, Bld: 155 mg/dL — ABNORMAL HIGH (ref 70–99)
Potassium: 4.8 mmol/L (ref 3.5–5.1)
Sodium: 135 mmol/L (ref 135–145)
Total Bilirubin: 0.3 mg/dL (ref 0.3–1.2)
Total Protein: 6.6 g/dL (ref 6.5–8.1)

## 2019-03-04 LAB — SARS CORONAVIRUS 2 BY RT PCR (HOSPITAL ORDER, PERFORMED IN ~~LOC~~ HOSPITAL LAB): SARS Coronavirus 2: NEGATIVE

## 2019-03-04 LAB — TROPONIN I (HIGH SENSITIVITY)
Troponin I (High Sensitivity): 32 ng/L — ABNORMAL HIGH (ref <18)
Troponin I (High Sensitivity): 34 ng/L — ABNORMAL HIGH (ref <18)

## 2019-03-04 LAB — TSH: TSH: 6.181 u[IU]/mL — ABNORMAL HIGH (ref 0.350–4.500)

## 2019-03-04 LAB — BRAIN NATRIURETIC PEPTIDE: B Natriuretic Peptide: 605 pg/mL — ABNORMAL HIGH (ref 0.0–100.0)

## 2019-03-04 LAB — MAGNESIUM: Magnesium: 1.8 mg/dL (ref 1.7–2.4)

## 2019-03-04 MED ORDER — FUROSEMIDE 10 MG/ML IJ SOLN
40.0000 mg | Freq: Once | INTRAMUSCULAR | Status: AC
  Administered 2019-03-04: 16:00:00 40 mg via INTRAVENOUS
  Filled 2019-03-04: qty 4

## 2019-03-04 MED ORDER — ONDANSETRON HCL 4 MG/2ML IJ SOLN: 4.0000 mg | Freq: Four times a day (QID) | INTRAMUSCULAR | Status: DC | PRN

## 2019-03-04 MED ORDER — GABAPENTIN 100 MG PO CAPS
100.0000 mg | ORAL_CAPSULE | Freq: Every day | ORAL | Status: DC
  Administered 2019-03-04 – 2019-03-05 (×2): 100 mg via ORAL
  Filled 2019-03-04 (×2): qty 1

## 2019-03-04 MED ORDER — ASPIRIN 81 MG PO CHEW
81.0000 mg | CHEWABLE_TABLET | Freq: Every day | ORAL | Status: DC
  Administered 2019-03-05: 10:00:00 81 mg via ORAL
  Filled 2019-03-04 (×2): qty 1

## 2019-03-04 MED ORDER — ONDANSETRON HCL 4 MG PO TABS: 4.0000 mg | ORAL_TABLET | Freq: Four times a day (QID) | ORAL | Status: DC | PRN

## 2019-03-04 MED ORDER — ATENOLOL 25 MG PO TABS
25.0000 mg | ORAL_TABLET | Freq: Two times a day (BID) | ORAL | Status: DC
  Administered 2019-03-04 – 2019-03-05 (×3): 25 mg via ORAL
  Filled 2019-03-04 (×4): qty 1

## 2019-03-04 MED ORDER — LEVOTHYROXINE SODIUM 75 MCG PO TABS
150.0000 ug | ORAL_TABLET | Freq: Every day | ORAL | Status: DC
  Administered 2019-03-05: 06:00:00 150 ug via ORAL
  Filled 2019-03-04 (×2): qty 2

## 2019-03-04 MED ORDER — IPRATROPIUM-ALBUTEROL 0.5-2.5 (3) MG/3ML IN SOLN: 3.0000 mL | Freq: Four times a day (QID) | RESPIRATORY_TRACT | Status: DC | PRN

## 2019-03-04 MED ORDER — ACETAMINOPHEN 650 MG RE SUPP: 650.0000 mg | Freq: Four times a day (QID) | RECTAL | Status: DC | PRN

## 2019-03-04 MED ORDER — ACETAMINOPHEN 325 MG PO TABS
650.0000 mg | ORAL_TABLET | Freq: Four times a day (QID) | ORAL | Status: DC | PRN
  Administered 2019-03-05 (×2): 650 mg via ORAL
  Filled 2019-03-04 (×2): qty 2

## 2019-03-04 MED ORDER — ENOXAPARIN SODIUM 40 MG/0.4ML ~~LOC~~ SOLN
40.0000 mg | SUBCUTANEOUS | Status: DC
  Administered 2019-03-04: 22:00:00 40 mg via SUBCUTANEOUS
  Filled 2019-03-04 (×2): qty 0.4

## 2019-03-04 MED ORDER — LORAZEPAM 0.5 MG PO TABS: 0.5000 mg | ORAL_TABLET | Freq: Every evening | ORAL | Status: DC | PRN

## 2019-03-04 MED ORDER — FERROUS SULFATE 325 (65 FE) MG PO TABS
325.0000 mg | ORAL_TABLET | Freq: Every day | ORAL | Status: DC
  Administered 2019-03-05: 10:00:00 325 mg via ORAL
  Filled 2019-03-04 (×2): qty 1

## 2019-03-04 MED ORDER — LISINOPRIL 5 MG PO TABS
5.0000 mg | ORAL_TABLET | Freq: Every day | ORAL | Status: DC
  Administered 2019-03-05: 10:00:00 5 mg via ORAL
  Filled 2019-03-04 (×2): qty 1

## 2019-03-04 MED ORDER — PANTOPRAZOLE SODIUM 40 MG PO TBEC
40.0000 mg | DELAYED_RELEASE_TABLET | Freq: Every day | ORAL | Status: DC
  Administered 2019-03-05: 10:00:00 40 mg via ORAL
  Filled 2019-03-04 (×2): qty 1

## 2019-03-04 MED ORDER — POLYETHYLENE GLYCOL 3350 17 G PO PACK: 17.0000 g | PACK | Freq: Every day | ORAL | Status: DC | PRN

## 2019-03-04 MED ORDER — FUROSEMIDE 10 MG/ML IJ SOLN
40.0000 mg | Freq: Two times a day (BID) | INTRAMUSCULAR | Status: DC
  Administered 2019-03-05 (×2): 40 mg via INTRAVENOUS
  Filled 2019-03-04 (×3): qty 4

## 2019-03-04 NOTE — ED Triage Notes (Signed)
Pt brought in by EMS from [facility]. EMS unclear why pt was sent. Staff stated that face swollen, hallucinations and leg pain with edema. Pt reports she does not know why she here. Pt has dementia

## 2019-03-04 NOTE — ED Provider Notes (Signed)
[facility] EMERGENCY DEPARTMENT Provider Note   CSN: [id number] Arrival date & time: 03/04/19  1337     History   Chief Complaint Chief Complaint  Patient presents with  . Leg Pain    HPI Lisa Kemp is a 85 y.o. female.     Patient complains of chest pain shortness of breath and swelling in legs  The history is provided by the patient. No language interpreter was used.  Weakness Severity:  Moderate Onset quality:  Sudden Timing:  Constant Progression:  Waxing and waning Chronicity:  Recurrent Context: not alcohol use   Relieved by:  Nothing Ineffective treatments:  None tried Associated symptoms: no abdominal pain, no chest pain, no cough, no diarrhea, no frequency, no headaches and no seizures     Past Medical History:  Diagnosis Date  . Arthritis   . CHF (congestive heart failure) ([place])   . Colon polyps   . Coronary atherosclerosis of native coronary artery    Nonobstructive at catherization 2006  . Dementia ([place])   . Esophageal ring    Distal esophageal ring status post dilation  . Gastrointestinal bleed    Related to NSAIDs  . GERD (gastroesophageal reflux disease)   . Hiatal hernia    Moderate sized sliding hiatal hernia  . Hypertension   . Hypothyroidism   . Intracranial hemorrhage ([place])    Left subcortical 2015  . Mixed hyperlipidemia    Statin intolerant  . Neuropathy     Patient Active Problem List   Diagnosis Date Noted  . Hyponatremia 09/12/2018  . HCAP (healthcare-associated pneumonia) 09/11/2018  . Hypothyroidism 09/11/2018  . GERD (gastroesophageal reflux disease) 09/11/2018  . Dementia ([place]) 09/11/2018  . Elevated troponin 09/11/2018  . Carotid atherosclerosis 06/11/2014  . Osteoarthritis of left knee 02/17/2014  . SDH (subdural hematoma) ([place]) 12/25/2013  . Intracerebral hemorrhage ([place]) 12/22/2013  . Essential hypertension, benign 08/01/2009  . Mixed hyperlipidemia 06/12/2009  . CAD, NATIVE VESSEL 06/12/2009    Past  Surgical History:  Procedure Laterality Date  . APPENDECTOMY    . CATARACT EXTRACTION    . COLON SURGERY    . TOTAL ABDOMINAL HYSTERECTOMY    . TOTAL ABDOMINAL HYSTERECTOMY W/ BILATERAL SALPINGOOPHORECTOMY       OB History   No obstetric history on file.      Home Medications    Prior to Admission medications   Medication Sig Start Date End Date Taking? Authorizing Provider  acetaminophen (TYLENOL) 500 MG tablet Take 500 mg by mouth every 8 (eight) hours as needed for mild pain or moderate pain.    [provider]  aspirin 81 MG tablet Take 81 mg by mouth daily.    [provider]  atenolol (TENORMIN) 25 MG tablet TAKE ONE TABLET BY MOUTH TWICE DAILY Patient taking differently: Take 25 mg by mouth 2 (two) times daily.  03/08/16   Satira Sark, MD  dextromethorphan-guaiFENesin (TUSSIN DM) 10-100 MG/5ML liquid Take 5 mLs by mouth every 6 (six) hours as needed for cough.    [provider]  ferrous sulfate 325 (65 FE) MG tablet Take 325 mg by mouth daily.    [provider]  furosemide (LASIX) 20 MG tablet Take 1 tablet (20 mg total) by mouth daily as needed for fluid. Patient not taking: Reported on 02/12/2019 09/15/18   Kathie Dike, MD  furosemide (LASIX) 40 MG tablet Take 1 tablet (40 mg total) by mouth daily for 4 days. 02/13/19 02/17/19  Long, Wonda Olds,  MD  gabapentin (NEURONTIN) 100 MG capsule Take 1 capsule (100 mg total) by mouth at bedtime. 01/01/14   Angiulli, Lavon Paganini, PA-C  levothyroxine (SYNTHROID, LEVOTHROID) 150 MCG tablet Take 150 mcg by mouth daily before breakfast.    [provider]  lisinopril (PRINIVIL,ZESTRIL) 5 MG tablet Take 5 mg by mouth daily.    [provider]  loperamide (IMODIUM A-D) 2 MG tablet Take 2 mg by mouth 4 (four) times daily as needed for diarrhea or loose stools.    [provider]  LORazepam (ATIVAN) 1 MG tablet Take 0.5-1 mg by mouth See admin instructions. Take 0.5mg  by mouth  daily in the evening. Take 1mg   At bedtime    [provider]  nitroGLYCERIN (NITROSTAT) 0.4 MG SL tablet Place 1 tablet (0.4 mg total) under the tongue as directed. 06/19/16   Satira Sark, MD  omeprazole (PRILOSEC) 40 MG capsule Take 40 mg by mouth daily.     [provider]  ondansetron (ZOFRAN) 4 MG tablet TAKE 1 TABLET BY MOUTH TWICE DAILY AS NEEDED FOR NAUSEA. Patient taking differently: Take 4 mg by mouth 3 (three) times daily as needed for nausea or vomiting.  12/27/16   Rehman, Mechele Dawley, MD  Oyster Shell (OYSTER CALCIUM) 500 MG TABS tablet Take 500 mg of elemental calcium by mouth 2 (two) times daily.     [provider]  potassium chloride (K-DUR) 10 MEQ tablet TAKE 1 tab daily with lasix Patient not taking: Reported on 02/12/2019 10/31/15   Satira Sark, MD  simethicone (MYLICON) 80 MG chewable tablet Chew 80 mg by mouth 2 (two) times daily.    [provider]  trolamine salicylate (ASPERCREME) 10 % cream Apply 1 application topically 2 (two) times daily as needed for muscle pain.    [provider]  vitamin B-12 (CYANOCOBALAMIN) 500 MCG tablet Take 500 mcg by mouth daily.    [provider]    Family History Family History  Problem Relation Age of Onset  . Heart attack Mother   . Heart attack Father   . Hypertension Other     Social History Social History   Tobacco Use  . Smoking status: Never Smoker  . Smokeless tobacco: Never Used  Substance Use Topics  . Alcohol use: No    Alcohol/week: 0.0 standard drinks    Comment: 06/11/14 -- "Never tasted a beer, wine, or a whiskey.." - AJ  . Drug use: No     Allergies   Diazepam and Statins   Review of Systems Review of Systems  Constitutional: Negative for appetite change and fatigue.  HENT: Negative for congestion, ear discharge and sinus pressure.   Eyes: Negative for discharge.  Respiratory: Negative for cough.   Cardiovascular: Negative for chest pain.   Gastrointestinal: Negative for abdominal pain and diarrhea.  Genitourinary: Negative for frequency and hematuria.  Musculoskeletal: Negative for back pain.  Skin: Negative for rash.  Neurological: Positive for weakness. Negative for seizures and headaches.  Psychiatric/Behavioral: Negative for hallucinations.     Physical Exam Updated Vital Signs BP (!) 149/78   Pulse 61   Temp 98 F (36.7 C) (Oral)   Resp (!) 28   SpO2 99%   Physical Exam Vitals signs and nursing note reviewed.  Constitutional:      Appearance: She is well-developed.  HENT:     Head: Normocephalic.     Nose: Nose normal.  Eyes:     General: No scleral icterus.  Conjunctiva/sclera: Conjunctivae normal.  Neck:     Musculoskeletal: Neck supple.     Thyroid: No thyromegaly.  Cardiovascular:     Rate and Rhythm: Normal rate and regular rhythm.     Heart sounds: No murmur. No friction rub. No gallop.   Pulmonary:     Breath sounds: No stridor. No wheezing or rales.  Chest:     Chest wall: No tenderness.  Abdominal:     General: There is no distension.     Tenderness: There is no abdominal tenderness. There is no rebound.  Musculoskeletal: Normal range of motion.     Comments: Patient has 3+ edema all the way up to her thighs  Lymphadenopathy:     Cervical: No cervical adenopathy.  Skin:    Findings: No erythema or rash.  Neurological:     Mental Status: She is oriented to person, place, and time.     Motor: No abnormal muscle tone.     Coordination: Coordination normal.  Psychiatric:        Behavior: Behavior normal.      ED Treatments / Results  Labs (all labs ordered are listed, but only abnormal results are displayed) Labs Reviewed  CBC WITH DIFFERENTIAL/PLATELET - Abnormal; Notable for the following components:      Result Value   RBC 3.51 (*)    Hemoglobin 10.1 (*)    HCT 35.0 (*)    MCHC 28.9 (*)    Platelets 141 (*)    All other components within normal limits  COMPREHENSIVE  METABOLIC PANEL - Abnormal; Notable for the following components:   Chloride 92 (*)    CO2 37 (*)    Glucose, Bld 155 (*)    BUN 26 (*)    All other components within normal limits  BRAIN NATRIURETIC PEPTIDE - Abnormal; Notable for the following components:   B Natriuretic Peptide 605.0 (*)    All other components within normal limits  TROPONIN I (HIGH SENSITIVITY) - Abnormal; Notable for the following components:   Troponin I (High Sensitivity) 32.00 (*)    All other components within normal limits  SARS CORONAVIRUS 2 (HOSPITAL ORDER, [place] [facility])  TROPONIN I (HIGH SENSITIVITY)    EKG EKG Interpretation  Date/Time:  Wednesday March 04 2019 13:55:45 EDT Ventricular Rate:  68 PR Interval:    QRS Duration: 97 QT Interval:  388 QTC Calculation: 413 R Axis:   106 Text Interpretation:  Sinus rhythm Right axis deviation Baseline wander in lead(s) V6 Confirmed by Milton Ferguson [phone]) on 03/04/2019 3:14:48 PM   Radiology Dg Chest Portable 1 View  Result Date: 03/04/2019 CLINICAL DATA:  Pt brought in by EMS from [facility]. EMS unclear why pt was sent. Staff stated that face swollen, hallucinations and leg pain with edema. Pt reports she does not know why she here. Pt has dementia EXAM: PORTABLE CHEST 1 VIEW COMPARISON:  02/12/2019 and older exams. FINDINGS: Cardiac silhouette is mildly enlarged. No mediastinal or hilar masses. Presumed hiatal hernia projects at the right costophrenic angle, similar to prior exams. There are prominent bilateral vascular markings with linear opacities in the lung bases, the latter finding likely atelectasis. This appearance is similar to prior studies. Possible small effusions.  No pneumothorax. Skeletal structures are demineralized but grossly intact. IMPRESSION: 1. Chest radiograph appearance suggests a mild degree of congestive heart failure if there is shortness of breath. Probable small effusions. No convincing pneumonia.  Electronically Signed   By: Dedra Skeens.D.  On: 03/04/2019 14:28    Procedures Procedures (including critical care time)  Medications Ordered in ED Medications  furosemide (LASIX) injection 40 mg (has no administration in time range)     Initial Impression / Assessment and Plan / ED Course  I have reviewed the triage vital signs and the nursing notes.  Pertinent labs & imaging results that were available during my care of the patient were reviewed by me and considered in my medical decision making (see chart for details).        Patient with anasarca from congestive heart failure.  She will be admitted to medicine Final Clinical Impressions(s) / ED Diagnoses   Final diagnoses:  None    ED Discharge Orders    None       Milton Ferguson, MD 03/04/19 [phone]

## 2019-03-04 NOTE — H&P (Addendum)
History and Physical    Lisa Kemp WJX:[id number] DOB: 08-Apr-1924 DOA: 03/04/2019  PCP: Glenda Chroman, MD   Patient coming from: [place] home  I have personally briefly reviewed patient's old medical records in [place]  Chief Complaint: Face swelling, hallucinations, leg pain with swelling.  HPI: Lisa Kemp is a 85 y.o. female with medical history significant for CHF, Dementia, CAD, hypothyroidism, SDH, was brought to the ED, with above complaints. Patient has dementia and is not exactly sure why she was brought here, but she is awake and alert, answers simple questions. She tells me she has some difficulty breathing, swelling and tightness in her legs, some pain in her right groin, chest and abdomen.  She is unable to tell me how long she has had these symptoms.  She denies cough.  Denies blood in stools or dark stools as far she knows. Medication lists Lasix, dose of which was increased from 20 mg to 40 mg daily for 5 days, at her last ED visit 02/12/2019, for lower extremity swelling, her BNP was elevated, but she was without significant respiratory symptoms..  She is on chronic O2.  ED Course: Tachypneic to 32, heart rate 60s, temperature 98, blood pressure 140s to 150s.  O2 sats greater than 99% on 2 L nasal cannula.  Portable chest x-ray shows mild CHF.  BNP elevated at 605, elevated high-sensitivity troponin at 32.  EKG sinus rhythm, no significant ST or T wave abnormalities.  Hemoglobin 10, mildly low platelets at 141.  Unremarkable CMP.  IV Lasix 40 mg given in the ED.  Hospitalist to admit for acute CHF exacerbation.  Review of Systems: As per HPI all other systems reviewed and negative.  Past Medical History:  Diagnosis Date  . Arthritis   . CHF (congestive heart failure) ([place])   . Colon polyps   . Coronary atherosclerosis of native coronary artery    Nonobstructive at catherization 2006  . Dementia ([place])   . Esophageal ring    Distal  esophageal ring status post dilation  . Gastrointestinal bleed    Related to NSAIDs  . GERD (gastroesophageal reflux disease)   . Hiatal hernia    Moderate sized sliding hiatal hernia  . Hypertension   . Hypothyroidism   . Intracranial hemorrhage ([place])    Left subcortical 2015  . Mixed hyperlipidemia    Statin intolerant  . Neuropathy     Past Surgical History:  Procedure Laterality Date  . APPENDECTOMY    . CATARACT EXTRACTION    . COLON SURGERY    . TOTAL ABDOMINAL HYSTERECTOMY    . TOTAL ABDOMINAL HYSTERECTOMY W/ BILATERAL SALPINGOOPHORECTOMY       reports that she has never smoked. She has never used smokeless tobacco. She reports that she does not drink alcohol or use drugs.  Allergies  Allergen Reactions  . Diazepam Other (See Comments)    Could not walk  . Statins Other (See Comments)    CRAMPING    Family History  Problem Relation Age of Onset  . Heart attack Mother   . Heart attack Father   . Hypertension Other     Prior to Admission medications   Medication Sig Start Date End Date Taking? Authorizing Provider  acetaminophen (TYLENOL) 500 MG tablet Take 500 mg by mouth every 8 (eight) hours as needed for mild pain or moderate pain.    [provider]  aspirin 81 MG tablet Take 81 mg by mouth daily.  [provider]  atenolol (TENORMIN) 25 MG tablet TAKE ONE TABLET BY MOUTH TWICE DAILY Patient taking differently: Take 25 mg by mouth 2 (two) times daily.  03/08/16   Satira Sark, MD  dextromethorphan-guaiFENesin (TUSSIN DM) 10-100 MG/5ML liquid Take 5 mLs by mouth every 6 (six) hours as needed for cough.    [provider]  ferrous sulfate 325 (65 FE) MG tablet Take 325 mg by mouth daily.    [provider]  furosemide (LASIX) 20 MG tablet Take 1 tablet (20 mg total) by mouth daily as needed for fluid. Patient not taking: Reported on 02/12/2019 09/15/18   Kathie Dike, MD  furosemide (LASIX) 40 MG tablet Take 1  tablet (40 mg total) by mouth daily for 4 days. 02/13/19 02/17/19  Long, Wonda Olds, MD  gabapentin (NEURONTIN) 100 MG capsule Take 1 capsule (100 mg total) by mouth at bedtime. 01/01/14   Angiulli, Lavon Paganini, PA-C  levothyroxine (SYNTHROID, LEVOTHROID) 150 MCG tablet Take 150 mcg by mouth daily before breakfast.    [provider]  lisinopril (PRINIVIL,ZESTRIL) 5 MG tablet Take 5 mg by mouth daily.    [provider]  loperamide (IMODIUM A-D) 2 MG tablet Take 2 mg by mouth 4 (four) times daily as needed for diarrhea or loose stools.    [provider]  LORazepam (ATIVAN) 1 MG tablet Take 0.5-1 mg by mouth See admin instructions. Take 0.5mg  by mouth daily in the evening. Take 1mg   At bedtime    [provider]  nitroGLYCERIN (NITROSTAT) 0.4 MG SL tablet Place 1 tablet (0.4 mg total) under the tongue as directed. 06/19/16   Satira Sark, MD  omeprazole (PRILOSEC) 40 MG capsule Take 40 mg by mouth daily.     [provider]  ondansetron (ZOFRAN) 4 MG tablet TAKE 1 TABLET BY MOUTH TWICE DAILY AS NEEDED FOR NAUSEA. Patient taking differently: Take 4 mg by mouth 3 (three) times daily as needed for nausea or vomiting.  12/27/16   Rehman, Mechele Dawley, MD  Oyster Shell (OYSTER CALCIUM) 500 MG TABS tablet Take 500 mg of elemental calcium by mouth 2 (two) times daily.     [provider]  potassium chloride (K-DUR) 10 MEQ tablet TAKE 1 tab daily with lasix Patient not taking: Reported on 02/12/2019 10/31/15   Satira Sark, MD  simethicone (MYLICON) 80 MG chewable tablet Chew 80 mg by mouth 2 (two) times daily.    [provider]  trolamine salicylate (ASPERCREME) 10 % cream Apply 1 application topically 2 (two) times daily as needed for muscle pain.    [provider]  vitamin B-12 (CYANOCOBALAMIN) 500 MCG tablet Take 500 mcg by mouth daily.    [provider]    Physical Exam: Vitals:   03/04/19 1415 03/04/19 1430 03/04/19 1445  03/04/19 1500  BP:    (!) 149/78  Pulse: 62  62 61  Resp: (!) 31 (!) 32 (!) 30 (!) 28  Temp:      TempSrc:      SpO2: 100%  100% 99%    Constitutional: NAD, calm, comfortable, lying in bed almost flat. Vitals:   03/04/19 1415 03/04/19 1430 03/04/19 1445 03/04/19 1500  BP:    (!) 149/78  Pulse: 62  62 61  Resp: (!) 31 (!) 32 (!) 30 (!) 28  Temp:      TempSrc:      SpO2: 100%  100% 99%   Eyes: PERRL, lids and conjunctivae  normal, face does not appear swollen to me ENMT: Mucous membranes are moist. Posterior pharynx clear of any exudate or lesions Neck: normal, supple, no masses, no thyromegaly Respiratory: Bilateral decreased air entry, with faint expiratory wheezing.. Normal respiratory effort. No accessory muscle use.  Cardiovascular: Regular rate and rhythm, no murmurs / rubs / gallops. 2+ pitting extremity edema to knees. 2+ pedal pulses.  Abdomen: no tenderness, no masses palpated. No hepatosplenomegaly. Bowel sounds positive.  Musculoskeletal: no clubbing / cyanosis. No joint deformity upper and lower extremities. Good ROM, no contractures. Normal muscle tone.  Mild discoloration on 3rd left toe, anterior surface. Skin: Diffuse flat hyperpigmented plaques and papules on back. No induration Neurologic: CN 2-12 grossly intact.  Strength 5/5 in all 4.  Psychiatric: Normal judgment and insight. Alert and oriented x 2-person and place.  Normal mood.   Labs on Admission: I have personally reviewed following labs and imaging studies  CBC: Recent Labs  Lab 03/04/19 1359  WBC 4.1  NEUTROABS 2.5  HGB 10.1*  HCT 35.0*  MCV 99.7  PLT 332*   Basic Metabolic Panel: Recent Labs  Lab 03/04/19 1359  NA 135  K 4.8  CL 92*  CO2 37*  GLUCOSE 155*  BUN 26*  CREATININE 0.82  CALCIUM 8.9   Liver Function Tests: Recent Labs  Lab 03/04/19 1359  AST 21  ALT 17  ALKPHOS 44  BILITOT 0.3  PROT 6.6  ALBUMIN 3.7    Radiological Exams on Admission: Dg Chest Portable 1 View   Result Date: 03/04/2019 CLINICAL DATA:  Pt brought in by EMS from [facility]. EMS unclear why pt was sent. Staff stated that face swollen, hallucinations and leg pain with edema. Pt reports she does not know why she here. Pt has dementia EXAM: PORTABLE CHEST 1 VIEW COMPARISON:  02/12/2019 and older exams. FINDINGS: Cardiac silhouette is mildly enlarged. No mediastinal or hilar masses. Presumed hiatal hernia projects at the right costophrenic angle, similar to prior exams. There are prominent bilateral vascular markings with linear opacities in the lung bases, the latter finding likely atelectasis. This appearance is similar to prior studies. Possible small effusions.  No pneumothorax. Skeletal structures are demineralized but grossly intact. IMPRESSION: 1. Chest radiograph appearance suggests a mild degree of congestive heart failure if there is shortness of breath. Probable small effusions. No convincing pneumonia. Electronically Signed   By: Lajean Manes M.D.   On: 03/04/2019 14:28    EKG: Independently reviewed.  Sinus rhythm, QTC 413.  No significant ST or T wave abnormalities compared to prior EKG.  Assessment/Plan Active Problems:   Acute CHF (congestive heart failure) (HCC)   CHF exacerbation-lower extremity swelling, mild difficulty breathing, tachypnea, elevated BNP 605 from prior 220.  Chest x-ray suggesting mild congestion.  Unable to view details of echo from 2012 and 2015, none on care everywhere.  Home medications Lasix 20 mg daily.  Elevated high-sensitivity troponin, EKG without significant ST or T wave abnormalities compared to prior. -Continue IV Lasix 40 mg twice daily -Obtain echocardiogram -Strict input output, daily weights -DuoNebs PRN -BMP a.m. -Continue aspirin, atenolol, lisinopril  Elevated Hs troponin- 32, likely demand secondary to acute CHF. Chronically elevated Troponins up to 0.19.  She reports nonspecific chest, abdominal and leg pain.  EKG without significant  abnormalities. No  -Follow-up high-sensitivity troponin- 34, trend is flat.   Chronic anemia- hemoglobin 10.1, 5 months ago hemoglobin was 12.7.  Also with mild thrombocytopenia, mildly low WBC.  She denies dark stools.  On  aspirin and iron tablets. -CBC a.m. -Iron panel a.m.  Hypertension-systolic 182X to 937J.  - Atenolol, lisinopril 5 mg  Dementia, subdural hematoma-appears stable. Reported hallucinations from nursing home, patient is awake, alert and oriented to person and place, she is answering simple questions appropriately. -Continue home QHS Ativan and gabapentin 100mg .  Hypothyroidism-  -Continue home Synthroid -Check TSH  DVT prophylaxis: Lovenox Code Status: DNR- Universal DNR form at bedside. Family Communication: None at bedside Disposition Plan: Per rounding team Consults called: None Admission status: Obs, Tele   Bethena Roys MD [facility]  03/04/2019, 4:22 PM

## 2019-03-04 NOTE — Progress Notes (Signed)
Consult request has been received. Pt is noted to have come from SNF, [facility]. CSW attempting to follow up at present time.   [facility]  Clinical Social Worker  Ph: [phone]

## 2019-03-05 ENCOUNTER — Observation Stay (HOSPITAL_BASED_OUTPATIENT_CLINIC_OR_DEPARTMENT_OTHER): Payer: Medicare Other

## 2019-03-05 DIAGNOSIS — Z79899 Other long term (current) drug therapy: Secondary | ICD-10-CM | POA: Diagnosis not present

## 2019-03-05 DIAGNOSIS — I340 Nonrheumatic mitral (valve) insufficiency: Secondary | ICD-10-CM

## 2019-03-05 DIAGNOSIS — I5033 Acute on chronic diastolic (congestive) heart failure: Secondary | ICD-10-CM | POA: Diagnosis present

## 2019-03-05 DIAGNOSIS — Z7982 Long term (current) use of aspirin: Secondary | ICD-10-CM | POA: Diagnosis not present

## 2019-03-05 DIAGNOSIS — R443 Hallucinations, unspecified: Secondary | ICD-10-CM | POA: Diagnosis present

## 2019-03-05 DIAGNOSIS — I361 Nonrheumatic tricuspid (valve) insufficiency: Secondary | ICD-10-CM | POA: Diagnosis not present

## 2019-03-05 DIAGNOSIS — E039 Hypothyroidism, unspecified: Secondary | ICD-10-CM | POA: Diagnosis present

## 2019-03-05 DIAGNOSIS — E782 Mixed hyperlipidemia: Secondary | ICD-10-CM | POA: Diagnosis present

## 2019-03-05 DIAGNOSIS — D696 Thrombocytopenia, unspecified: Secondary | ICD-10-CM | POA: Diagnosis present

## 2019-03-05 DIAGNOSIS — I5031 Acute diastolic (congestive) heart failure: Secondary | ICD-10-CM | POA: Diagnosis not present

## 2019-03-05 DIAGNOSIS — K219 Gastro-esophageal reflux disease without esophagitis: Secondary | ICD-10-CM | POA: Diagnosis present

## 2019-03-05 DIAGNOSIS — Z9981 Dependence on supplemental oxygen: Secondary | ICD-10-CM | POA: Diagnosis not present

## 2019-03-05 DIAGNOSIS — I509 Heart failure, unspecified: Secondary | ICD-10-CM

## 2019-03-05 DIAGNOSIS — Z20828 Contact with and (suspected) exposure to other viral communicable diseases: Secondary | ICD-10-CM | POA: Diagnosis present

## 2019-03-05 DIAGNOSIS — Z79890 Hormone replacement therapy (postmenopausal): Secondary | ICD-10-CM | POA: Diagnosis not present

## 2019-03-05 DIAGNOSIS — Z888 Allergy status to other drugs, medicaments and biological substances status: Secondary | ICD-10-CM | POA: Diagnosis not present

## 2019-03-05 DIAGNOSIS — Z90722 Acquired absence of ovaries, bilateral: Secondary | ICD-10-CM | POA: Diagnosis not present

## 2019-03-05 DIAGNOSIS — F0390 Unspecified dementia without behavioral disturbance: Secondary | ICD-10-CM | POA: Diagnosis present

## 2019-03-05 DIAGNOSIS — I2510 Atherosclerotic heart disease of native coronary artery without angina pectoris: Secondary | ICD-10-CM | POA: Diagnosis present

## 2019-03-05 DIAGNOSIS — D649 Anemia, unspecified: Secondary | ICD-10-CM | POA: Diagnosis present

## 2019-03-05 DIAGNOSIS — G629 Polyneuropathy, unspecified: Secondary | ICD-10-CM | POA: Diagnosis present

## 2019-03-05 DIAGNOSIS — Z8249 Family history of ischemic heart disease and other diseases of the circulatory system: Secondary | ICD-10-CM | POA: Diagnosis not present

## 2019-03-05 DIAGNOSIS — Z66 Do not resuscitate: Secondary | ICD-10-CM | POA: Diagnosis present

## 2019-03-05 DIAGNOSIS — Z86010 Personal history of colonic polyps: Secondary | ICD-10-CM | POA: Diagnosis not present

## 2019-03-05 DIAGNOSIS — Z90710 Acquired absence of both cervix and uterus: Secondary | ICD-10-CM | POA: Diagnosis not present

## 2019-03-05 DIAGNOSIS — I272 Pulmonary hypertension, unspecified: Secondary | ICD-10-CM | POA: Diagnosis present

## 2019-03-05 DIAGNOSIS — I110 Hypertensive heart disease with heart failure: Secondary | ICD-10-CM | POA: Diagnosis present

## 2019-03-05 DIAGNOSIS — M1990 Unspecified osteoarthritis, unspecified site: Secondary | ICD-10-CM | POA: Diagnosis present

## 2019-03-05 LAB — CBC
HCT: 35.2 % — ABNORMAL LOW (ref 36.0–46.0)
Hemoglobin: 10.2 g/dL — ABNORMAL LOW (ref 12.0–15.0)
MCH: 28.3 pg (ref 26.0–34.0)
MCHC: 29 g/dL — ABNORMAL LOW (ref 30.0–36.0)
MCV: 97.8 fL (ref 80.0–100.0)
Platelets: 155 10*3/uL (ref 150–400)
RBC: 3.6 MIL/uL — ABNORMAL LOW (ref 3.87–5.11)
RDW: 14.4 % (ref 11.5–15.5)
WBC: 4.4 10*3/uL (ref 4.0–10.5)
nRBC: 0 % (ref 0.0–0.2)

## 2019-03-05 LAB — BASIC METABOLIC PANEL
Anion gap: 9 (ref 5–15)
BUN: 26 mg/dL — ABNORMAL HIGH (ref 8–23)
CO2: 40 mmol/L — ABNORMAL HIGH (ref 22–32)
Calcium: 9.2 mg/dL (ref 8.9–10.3)
Chloride: 90 mmol/L — ABNORMAL LOW (ref 98–111)
Creatinine, Ser: 0.86 mg/dL (ref 0.44–1.00)
GFR calc Af Amer: >60 mL/min (ref >60)
GFR calc non Af Amer: 58 mL/min — ABNORMAL LOW (ref >60)
Glucose, Bld: 85 mg/dL (ref 70–99)
Potassium: 4.9 mmol/L (ref 3.5–5.1)
Sodium: 139 mmol/L (ref 135–145)

## 2019-03-05 LAB — ECHOCARDIOGRAM COMPLETE
Height: 63 in
Weight: 2398.6 oz

## 2019-03-05 LAB — FERRITIN: Ferritin: 32 ng/mL (ref 11–307)

## 2019-03-05 LAB — IRON AND TIBC
Iron: 25 ug/dL — ABNORMAL LOW (ref 28–170)
Saturation Ratios: 9 % — ABNORMAL LOW (ref 10.4–31.8)
TIBC: 267 ug/dL (ref 250–450)
UIBC: 242 ug/dL

## 2019-03-05 LAB — MRSA PCR SCREENING: MRSA by PCR: NEGATIVE

## 2019-03-05 MED ORDER — LORAZEPAM 0.5 MG PO TABS
0.5000 mg | ORAL_TABLET | Freq: Four times a day (QID) | ORAL | Status: DC | PRN
  Filled 2019-03-05 (×2): qty 1

## 2019-03-05 MED ORDER — DOCUSATE SODIUM 100 MG PO CAPS
100.0000 mg | ORAL_CAPSULE | Freq: Every day | ORAL | Status: DC
  Administered 2019-03-05: 19:00:00 100 mg via ORAL
  Filled 2019-03-05 (×2): qty 1

## 2019-03-05 NOTE — NC FL2 (Addendum)
[place] LEVEL OF CARE SCREENING TOOL     IDENTIFICATION  Patient Name: Lisa Kemp Birthdate: 11-05-1923 Sex: female Admission Date (Current Location): 03/04/2019  [facility] and [place] Number:  [employer] and Address:  [facility] [address] [place]      Provider Number: [id number]  Attending Physician Name and Address:  Rodena Goldmann, DO  Relative Name and Phone Number:       Current Level of Care: Hospital Recommended Level of Care: [place] Prior Approval Number:    Date Approved/Denied:   PASRR Number:    Discharge Plan: Domiciliary (Rest home)    Current Diagnoses: Patient Active Problem List   Diagnosis Date Noted  . Acute diastolic (congestive) heart failure ([place]) 03/05/2019  . Acute CHF (congestive heart failure) ([place]) 03/04/2019  . Hyponatremia 09/12/2018  . HCAP (healthcare-associated pneumonia) 09/11/2018  . Hypothyroidism 09/11/2018  . GERD (gastroesophageal reflux disease) 09/11/2018  . Dementia ([place]) 09/11/2018  . Elevated troponin 09/11/2018  . Carotid atherosclerosis 06/11/2014  . Osteoarthritis of left knee 02/17/2014  . SDH (subdural hematoma) ([place]) 12/25/2013  . Intracerebral hemorrhage ([place]) 12/22/2013  . Essential hypertension, benign 08/01/2009  . Mixed hyperlipidemia 06/12/2009  . CAD, NATIVE VESSEL 06/12/2009    Orientation RESPIRATION BLADDER Height & Weight     Self  O2(2 Liters) Incontinent Weight: 68 kg Height:  5\' 3"  (160 cm)  BEHAVIORAL SYMPTOMS/MOOD NEUROLOGICAL BOWEL NUTRITION STATUS      Incontinent Diet(Regular / No salt added)  AMBULATORY STATUS COMMUNICATION OF NEEDS Skin   Extensive Assist Verbally Normal                       Personal Care Assistance Level of Assistance  Bathing, Feeding, Dressing Bathing Assistance: Limited assistance Feeding assistance: Limited assistance Dressing Assistance: Limited assistance     Functional  Limitations Info  Sight, Hearing, Speech Sight Info: Adequate Hearing Info: Impaired Speech Info: Adequate    SPECIAL CARE FACTORS FREQUENCY  PT (By licensed PT)     PT Frequency: 3 times a week              Contractures Contractures Info: Not present    Additional Factors Info  Code Status, Allergies, Psychotropic Code Status Info: DNR Allergies Info: Diaepam, Statins Psychotropic Info: Ativan         Current Medications (03/05/2019):  This is the current hospital active medication list Current Facility-Administered Medications  Medication Dose Route Frequency Provider Last Rate Last Dose  . acetaminophen (TYLENOL) tablet 650 mg  650 mg Oral Q6H PRN Emokpae, Ejiroghene E, MD   650 mg at 03/05/19 0543   Or  . acetaminophen (TYLENOL) suppository 650 mg  650 mg Rectal Q6H PRN Emokpae, Ejiroghene E, MD      . aspirin chewable tablet 81 mg  81 mg Oral Daily Emokpae, Ejiroghene E, MD   81 mg at 03/05/19 1005  . atenolol (TENORMIN) tablet 25 mg  25 mg Oral BID Emokpae, Ejiroghene E, MD   25 mg at 03/05/19 1005  . docusate sodium (COLACE) capsule 100 mg  100 mg Oral Daily Shah, Pratik D, DO      . enoxaparin (LOVENOX) injection 40 mg  40 mg Subcutaneous Q24H Emokpae, Ejiroghene E, MD   40 mg at 03/04/19 2156  . ferrous sulfate tablet 325 mg  325 mg Oral Daily Emokpae, Ejiroghene E, MD   325 mg at 03/05/19 1005  .  furosemide (LASIX) injection 40 mg  40 mg Intravenous BID Emokpae, Ejiroghene E, MD   40 mg at 03/05/19 1006  . gabapentin (NEURONTIN) capsule 100 mg  100 mg Oral QHS Emokpae, Ejiroghene E, MD   100 mg at 03/04/19 2156  . ipratropium-albuterol (DUONEB) 0.5-2.5 (3) MG/3ML nebulizer solution 3 mL  3 mL Nebulization Q6H PRN Emokpae, Ejiroghene E, MD      . levothyroxine (SYNTHROID) tablet 150 mcg  150 mcg Oral QAC breakfast Emokpae, Ejiroghene E, MD   150 mcg at 03/05/19 0543  . lisinopril (ZESTRIL) tablet 5 mg  5 mg Oral Daily Emokpae, Ejiroghene E, MD   5 mg at 03/05/19 1005   . LORazepam (ATIVAN) tablet 0.5 mg  0.5 mg Oral QHS PRN Emokpae, Ejiroghene E, MD      . ondansetron (ZOFRAN) tablet 4 mg  4 mg Oral Q6H PRN Emokpae, Ejiroghene E, MD       Or  . ondansetron (ZOFRAN) injection 4 mg  4 mg Intravenous Q6H PRN Emokpae, Ejiroghene E, MD      . pantoprazole (PROTONIX) EC tablet 40 mg  40 mg Oral Daily Emokpae, Ejiroghene E, MD   40 mg at 03/05/19 1005  . polyethylene glycol (MIRALAX / GLYCOLAX) packet 17 g  17 g Oral Daily PRN Emokpae, Ejiroghene E, MD         Discharge Medications: TAKE these medications   acetaminophen 500 MG tablet Commonly known as: TYLENOL Take 500 mg by mouth every 8 (eight) hours as needed for mild pain or moderate pain.   aspirin 81 MG tablet Take 81 mg by mouth daily.   atenolol 25 MG tablet Commonly known as: TENORMIN TAKE ONE TABLET BY MOUTH TWICE DAILY   docusate sodium 100 MG capsule Commonly known as: COLACE Take 1 capsule (100 mg total) by mouth daily. Start taking on: March 07, 2019   ferrous sulfate 325 (65 FE) MG tablet Take 325 mg by mouth daily.   furosemide 40 MG tablet Commonly known as: Lasix Take 1 tablet (40 mg total) by mouth daily. What changed: Another medication with the same name was removed. Continue taking this medication, and follow the directions you see here.   gabapentin 100 MG capsule Commonly known as: NEURONTIN Take 1 capsule (100 mg total) by mouth at bedtime.   levothyroxine 150 MCG tablet Commonly known as: SYNTHROID Take 150 mcg by mouth daily before breakfast.   lisinopril 5 MG tablet Commonly known as: ZESTRIL Take 5 mg by mouth daily.   loperamide 2 MG tablet Commonly known as: IMODIUM A-D Take 2 mg by mouth 4 (four) times daily as needed for diarrhea or loose stools.   LORazepam 1 MG tablet Commonly known as: ATIVAN Take 0.5-1 mg by mouth See admin instructions. Take 0.5mg  by mouth daily in the evening. Take 1mg   At bedtime   nitroGLYCERIN 0.4 MG SL  tablet Commonly known as: NITROSTAT Place 1 tablet (0.4 mg total) under the tongue as directed.   omeprazole 40 MG capsule Commonly known as: PRILOSEC Take 40 mg by mouth daily.   ondansetron 4 MG tablet Commonly known as: ZOFRAN TAKE 1 TABLET BY MOUTH TWICE DAILY AS NEEDED FOR NAUSEA. What changed: See the new instructions.   oyster calcium 500 MG Tabs tablet Take 500 mg of elemental calcium by mouth 2 (two) times daily.   potassium chloride 10 MEQ tablet Commonly known as: K-DUR Take 1 tablet (10 mEq total) by mouth daily. What changed:   how  much to take  how to take this  when to take this  additional instructions   simethicone 80 MG chewable tablet Commonly known as: MYLICON Chew 80 mg by mouth 2 (two) times daily.   trolamine salicylate 10 % cream Commonly known as: ASPERCREME Apply 1 application topically 2 (two) times daily as needed for muscle pain.   Tussin DM 10-100 MG/5ML liquid Generic drug: dextromethorphan-guaiFENesin Take 5 mLs by mouth every 6 (six) hours as needed for cough.   vitamin B-12 500 MCG tablet Commonly known as: CYANOCOBALAMIN Take 500 mcg by mouth daily.        Relevant Imaging Results:  Relevant Lab Results:   Additional Information    Boneta Lucks, RN

## 2019-03-05 NOTE — Progress Notes (Addendum)
PROGRESS NOTE    Lisa Kemp  ASN:[id number] DOB: 1923/12/21 DOA: 03/04/2019 PCP: Glenda Chroman, MD   Brief Narrative:  Per HPI: Lisa Kemp is a 85 y.o. female with medical history significant for CHF, Dementia, CAD, hypothyroidism, SDH, was brought to the ED, with above complaints. Patient has dementia and is not exactly sure why she was brought here, but she is awake and alert, answers simple questions. She tells me she has some difficulty breathing, swelling and tightness in her legs, some pain in her right groin, chest and abdomen.  She is unable to tell me how long she has had these symptoms.  She denies cough.  Denies blood in stools or dark stools as far she knows. Medication lists Lasix, dose of which was increased from 20 mg to 40 mg daily for 5 days, at her last ED visit 02/12/2019, for lower extremity swelling, her BNP was elevated, but she was without significant respiratory symptoms. She is on chronic O2.  Patient was admitted with lower extremity edema as well as dyspnea secondary to diastolic CHF exacerbation.  She has been started on IV Lasix for diuresis and appears to be improving.  Assessment & Plan:   Active Problems:   Acute CHF (congestive heart failure) (HCC)   CHF exacerbation-lower extremity swelling, mild difficulty breathing, tachypnea, elevated BNP 605 from prior 220.  Chest x-ray suggesting mild congestion.  Unable to view details of echo from 2012 and 2015, none on care everywhere.  Home medications Lasix 20 mg daily.  Elevated high-sensitivity troponin, EKG without significant ST or T wave abnormalities compared to prior. -Continue IV Lasix 40 mg twice daily -Echo with LVEF 55-60% and elevated PASP noted -Strict input output, daily weights -DuoNebs PRN sob/wheezing -BMP a.m. -Continue aspirin, atenolol, lisinopril  Elevated Hs troponin- 32, likely demand secondary to acute CHF. Chronically elevated Troponins up to 0.19.  She reports  nonspecific chest, abdominal and leg pain.  EKG without significant abnormalities. No  -Follow-up high-sensitivity troponin- 34, trend is flat.  -No concern for ACS  Chronic anemia- hemoglobin 10.1, 5 months ago hemoglobin was 12.7.  Also with mild thrombocytopenia, mildly low WBC.  She denies dark stools.  On aspirin and iron tablets. -CBC a.m. -Iron panel a.m. -Continue oral iron supplementation with stool softener added  Hypertension-systolic 193X to 902I.  - Atenolol, lisinopril 5 mg  Dementia, subdural hematoma-appears stable. Reported hallucinations from nursing home, patient is awake, alert and oriented to person and place, she is answering simple questions appropriately. -Continue home QHS Ativan and gabapentin 100mg .  Hypothyroidism-  -Continue home Synthroid and increase dose from 150 to 156mcg -TSH 6.18   DVT prophylaxis:Lovenox Code Status: DNR Family Communication: None at bedside Disposition Plan: Continue IV diuresis as planned with hopeful DC back to SNF in 24 hours.  Patient requires inpatient stay due to ongoing IV diuresis.  Consultants:   None  Procedures:   None  Antimicrobials:   None   Subjective: Patient seen and evaluated today with no new acute complaints or concerns. No acute concerns or events noted overnight. She remains on 2.5L [place] and continues to have some minimal shortness of breath and is not quite at baseline.  Objective: Vitals:   03/04/19 1900 03/04/19 2001 03/04/19 2141 03/05/19 0519  BP:   (!) 159/67 (!) 150/87  Pulse:   71 71  Resp:   18 18  Temp:   (!) 97.5 F (36.4 C) 98 F (36.7 C)  TempSrc:  Oral Oral  SpO2:  99% 100% 99%  Weight: 68 kg     Height: 5\' 3"  (1.6 m)       Intake/Output Summary (Last 24 hours) at 03/05/2019 1123 Last data filed at 03/05/2019 0915 Gross per 24 hour  Intake 480 ml  Output 1100 ml  Net -620 ml   Filed Weights   03/04/19 1900  Weight: 68 kg    Examination:  General exam:  Appears calm and comfortable  Respiratory system: Clear to auscultation. Respiratory effort normal.  Currently on 2.5 L nasal cannula Cardiovascular system: S1 & S2 heard, RRR. No JVD, murmurs, rubs, gallops or clicks.  1-2+ edema noted bilaterally below knees. Gastrointestinal system: Abdomen is nondistended, soft and nontender. No organomegaly or masses felt. Normal bowel sounds heard. Central nervous system: Alert and oriented. No focal neurological deficits. Extremities: Symmetric 5 x 5 power. Skin: No rashes, lesions or ulcers Psychiatry: Judgement and insight appear normal. Mood & affect appropriate.     Data Reviewed: I have personally reviewed following labs and imaging studies  CBC: Recent Labs  Lab 03/04/19 1359 03/05/19 0451  WBC 4.1 4.4  NEUTROABS 2.5  --   HGB 10.1* 10.2*  HCT 35.0* 35.2*  MCV 99.7 97.8  PLT 141* 921   Basic Metabolic Panel: Recent Labs  Lab 03/04/19 1359 03/05/19 0451  NA 135 139  K 4.8 4.9  CL 92* 90*  CO2 37* 40*  GLUCOSE 155* 85  BUN 26* 26*  CREATININE 0.82 0.86  CALCIUM 8.9 9.2  MG 1.8  --    GFR: Estimated Creatinine Clearance: 37.8 mL/min (by C-G formula based on SCr of 0.86 mg/dL). Liver Function Tests: Recent Labs  Lab 03/04/19 1359  AST 21  ALT 17  ALKPHOS 44  BILITOT 0.3  PROT 6.6  ALBUMIN 3.7   No results for input(s): LIPASE, AMYLASE in the last 168 hours. No results for input(s): AMMONIA in the last 168 hours. Coagulation Profile: No results for input(s): INR, PROTIME in the last 168 hours. Cardiac Enzymes: No results for input(s): CKTOTAL, CKMB, CKMBINDEX, TROPONINI in the last 168 hours. BNP (last 3 results) No results for input(s): PROBNP in the last 8760 hours. HbA1C: No results for input(s): HGBA1C in the last 72 hours. CBG: No results for input(s): GLUCAP in the last 168 hours. Lipid Profile: No results for input(s): CHOL, HDL, LDLCALC, TRIG, CHOLHDL, LDLDIRECT in the last 72 hours. Thyroid Function  Tests: Recent Labs    03/04/19 1359  TSH 6.181*   Anemia Panel: Recent Labs    03/05/19 0451  FERRITIN 32  TIBC 267  IRON 25*   Sepsis Labs: No results for input(s): PROCALCITON, LATICACIDVEN in the last 168 hours.  Recent Results (from the past 240 hour(s))  SARS Coronavirus 2 (CEPHEID - Performed in [place] hospital lab), Hosp Order     Status: None   Collection Time: 03/04/19  3:15 PM   Specimen: Nasopharyngeal Swab  Result Value Ref Range Status   SARS Coronavirus 2 NEGATIVE NEGATIVE Final    Comment: (NOTE) If result is NEGATIVE SARS-CoV-2 target nucleic acids are NOT DETECTED. The SARS-CoV-2 RNA is generally detectable in upper and lower  respiratory specimens during the acute phase of infection. The lowest  concentration of SARS-CoV-2 viral copies this assay can detect is 250  copies / mL. A negative result does not preclude SARS-CoV-2 infection  and should not be used as the sole basis for treatment or other  patient management decisions.  A negative result may occur with  improper specimen collection / handling, submission of specimen other  than nasopharyngeal swab, presence of viral mutation(s) within the  areas targeted by this assay, and inadequate number of viral copies  (<250 copies / mL). A negative result must be combined with clinical  observations, patient history, and epidemiological information. If result is POSITIVE SARS-CoV-2 target nucleic acids are DETECTED. The SARS-CoV-2 RNA is generally detectable in upper and lower  respiratory specimens dur ing the acute phase of infection.  Positive  results are indicative of active infection with SARS-CoV-2.  Clinical  correlation with patient history and other diagnostic information is  necessary to determine patient infection status.  Positive results do  not rule out bacterial infection or co-infection with other viruses. If result is PRESUMPTIVE POSTIVE SARS-CoV-2 nucleic acids MAY BE PRESENT.    A presumptive positive result was obtained on the submitted specimen  and confirmed on repeat testing.  While 2019 novel coronavirus  (SARS-CoV-2) nucleic acids may be present in the submitted sample  additional confirmatory testing may be necessary for epidemiological  and / or clinical management purposes  to differentiate between  SARS-CoV-2 and other Sarbecovirus currently known to infect humans.  If clinically indicated additional testing with an alternate test  methodology [phone]) is advised. The SARS-CoV-2 RNA is generally  detectable in upper and lower respiratory sp ecimens during the acute  phase of infection. The expected result is Negative. Fact Sheet for Patients:  StrictlyIdeas.no Fact Sheet for Healthcare Providers: BankingDealers.co.za This test is not yet approved or cleared by the [place] FDA and has been authorized for detection and/or diagnosis of SARS-CoV-2 by FDA under an Emergency Use Authorization (EUA).  This EUA will remain in effect (meaning this test can be used) for the duration of the COVID-19 declaration under Section 564(b)(1) of the Act, 21 U.S.C. section 360bbb-3(b)(1), unless the authorization is terminated or revoked sooner. Performed at [facility], [address] [place], [place] [address]   MRSA PCR Screening     Status: None   Collection Time: 03/05/19  5:50 AM   Specimen: Nasopharyngeal  Result Value Ref Range Status   MRSA by PCR NEGATIVE NEGATIVE Final    Comment:        The GeneXpert MRSA Assay (FDA approved for NASAL specimens only), is one component of a comprehensive MRSA colonization surveillance program. It is not intended to diagnose MRSA infection nor to guide or monitor treatment for MRSA infections. Performed at [facility], [address] [place], [place] [address]          Radiology Studies: Dg Chest Portable 1 View  Result Date: 03/04/2019 CLINICAL DATA:   Pt brought in by EMS from [facility]. EMS unclear why pt was sent. Staff stated that face swollen, hallucinations and leg pain with edema. Pt reports she does not know why she here. Pt has dementia EXAM: PORTABLE CHEST 1 VIEW COMPARISON:  02/12/2019 and older exams. FINDINGS: Cardiac silhouette is mildly enlarged. No mediastinal or hilar masses. Presumed hiatal hernia projects at the right costophrenic angle, similar to prior exams. There are prominent bilateral vascular markings with linear opacities in the lung bases, the latter finding likely atelectasis. This appearance is similar to prior studies. Possible small effusions.  No pneumothorax. Skeletal structures are demineralized but grossly intact. IMPRESSION: 1. Chest radiograph appearance suggests a mild degree of congestive heart failure if there is shortness of breath. Probable small effusions. No convincing pneumonia. Electronically Signed  By: Lajean Manes M.D.   On: 03/04/2019 14:28        Scheduled Meds: . aspirin  81 mg Oral Daily  . atenolol  25 mg Oral BID  . docusate sodium  100 mg Oral Daily  . enoxaparin (LOVENOX) injection  40 mg Subcutaneous Q24H  . ferrous sulfate  325 mg Oral Daily  . furosemide  40 mg Intravenous BID  . gabapentin  100 mg Oral QHS  . levothyroxine  150 mcg Oral QAC breakfast  . lisinopril  5 mg Oral Daily  . pantoprazole  40 mg Oral Daily   Continuous Infusions:   LOS: 0 days    Time spent: 30 minutes     Darleen Crocker, DO [facility] Pager [phone]  If 7PM-7AM, please contact night-coverage [email or web address] Password [email or web address] 03/05/2019, 11:23 AM

## 2019-03-05 NOTE — TOC Initial Note (Signed)
Transition of Care [facility]) - Initial/Assessment Note    Patient Details  Name: Lisa Kemp MRN: [id number] Date of Birth: [birth year]  [facility]) CM/SW Contact:    Boneta Lucks, RN Phone Number: 03/05/2019, 1:25 PM  Clinical Narrative:   Patient admitted in Observation for CHF. Patient lives at [facility]. Patient is High risk for readmission. Spoke with facility. No need needs identified. Patient walks with a walker, needs assistants with ADL.s.  Patient is on a regular diet with no added salt.   Patient has PCP, Dr Woody Seller,  [facility] completed.   TOC will follow.                Expected Discharge Plan: Long Term Nursing Home([facility]) Barriers to Discharge: No Barriers Identified   Patient Goals and CMS Choice Patient states their goals for this hospitalization and ongoing recovery are:: to go back to [facility].      Expected Discharge Plan and Services Expected Discharge Plan: [facility])         Expected Discharge Date: 03/05/19                 Prior Living Arrangements/Services   Lives with:: Facility Resident    Activities of Daily Living Home Assistive Devices/Equipment: None ADL Screening (condition at time of admission) Patient's cognitive ability adequate to safely complete daily activities?: No Is the patient deaf or have difficulty hearing?: No Does the patient have difficulty seeing, even when wearing glasses/contacts?: No Does the patient have difficulty concentrating, remembering, or making decisions?: Yes Patient able to express need for assistance with ADLs?: Yes Does the patient have difficulty dressing or bathing?: Yes Independently performs ADLs?: No Communication: Independent Dressing (OT): Needs assistance Is this a change from baseline?: Pre-admission baseline Grooming: Needs assistance Is this a change from baseline?: Pre-admission baseline Feeding: Needs assistance Is this a change from baseline?:  Pre-admission baseline Bathing: Needs assistance Is this a change from baseline?: Pre-admission baseline Toileting: Needs assistance Is this a change from baseline?: Pre-admission baseline In/Out Bed: Needs assistance Is this a change from baseline?: Pre-admission baseline Walks in Home: Needs assistance Is this a change from baseline?: Pre-admission baseline Does the patient have difficulty walking or climbing stairs?: Yes Weakness of Legs: Both Weakness of Arms/Hands: Both  Permission Sought/Granted Permission sought to share information with : Case Manager, Customer service manager                Emotional Assessment Appearance:: Appears stated age     Orientation: : Oriented to Self Alcohol / Substance Use: Not Applicable Psych Involvement: No (comment)  Admission diagnosis:  leg pain Patient Active Problem List   Diagnosis Date Noted  . Acute diastolic (congestive) heart failure ([place]) 03/05/2019  . Acute CHF (congestive heart failure) ([place]) 03/04/2019  . Hyponatremia 09/12/2018  . HCAP (healthcare-associated pneumonia) 09/11/2018  . Hypothyroidism 09/11/2018  . GERD (gastroesophageal reflux disease) 09/11/2018  . Dementia ([place]) 09/11/2018  . Elevated troponin 09/11/2018  . Carotid atherosclerosis 06/11/2014  . Osteoarthritis of left knee 02/17/2014  . SDH (subdural hematoma) ([place]) 12/25/2013  . Intracerebral hemorrhage ([place]) 12/22/2013  . Essential hypertension, benign 08/01/2009  . Mixed hyperlipidemia 06/12/2009  . CAD, NATIVE VESSEL 06/12/2009   PCP:  Glenda Chroman, MD Pharmacy:   [facility] [address] [place] [address] Phone: [phone] Fax: [place], [place] [address]  [place] [address] Phone: [phone] Fax: [place], [place] [address] Phone: [phone] Fax:  [phone]     Social Determinants of Health ([place]) Interventions    Readmission Risk Interventions Readmission Risk Prevention Plan 03/05/2019  Transportation Screening Complete  PCP or Specialist Appt within 3-5 Days Complete  HRI or [place] Not Complete  Social Work Consult for [place] Planning/Counseling Not Complete  Palliative Care Screening Not Complete  Medication Review Press photographer) Complete  Some recent data might be hidden

## 2019-03-05 NOTE — Progress Notes (Signed)
*  PRELIMINARY RESULTS* Echocardiogram 2D Echocardiogram has been performed.  Lisa Kemp 03/05/2019, 10:09 AM

## 2019-03-05 NOTE — Care Management Obs Status (Signed)
[place] NOTIFICATION   Patient Details  Name: CHRYSTA FULCHER MRN: [id number] Date of Birth: [birth year]   Medicare Observation Status Notification Given:  Yes    Boneta Lucks, RN 03/05/2019, 10:06 AM

## 2019-03-06 DIAGNOSIS — I5031 Acute diastolic (congestive) heart failure: Secondary | ICD-10-CM

## 2019-03-06 MED ORDER — FUROSEMIDE 40 MG PO TABS: 40.0000 mg | ORAL_TABLET | Freq: Every day | ORAL | 11 refills | Status: DC

## 2019-03-06 MED ORDER — POTASSIUM CHLORIDE ER 10 MEQ PO TBCR: 10.0000 meq | EXTENDED_RELEASE_TABLET | Freq: Every day | ORAL | 0 refills | Status: DC

## 2019-03-06 MED ORDER — DOCUSATE SODIUM 100 MG PO CAPS: 100.0000 mg | ORAL_CAPSULE | Freq: Every day | ORAL | 0 refills | Status: DC

## 2019-03-06 MED ORDER — HALOPERIDOL LACTATE 5 MG/ML IJ SOLN
2.0000 mg | Freq: Four times a day (QID) | INTRAMUSCULAR | Status: DC | PRN
  Filled 2019-03-06: qty 1

## 2019-03-06 NOTE — TOC Transition Note (Addendum)
Transition of Care [facility]) - CM/SW Discharge Note   Patient Details  Name: Lisa Kemp MRN: [id number] Date of Birth: 02/11/1924  Transition of Care [facility]) CM/SW Contact:  Boneta Lucks, RN Phone Number: 03/06/2019, 11:59 AM   Clinical Narrative:   Per MD patient refuse IV medication, it is appropriate to change her to PO and discharge. [facility] will pick up at 1PM.    Final next level of care: Long Term Acute Care (LTAC) Barriers to Discharge: No Barriers Identified   Patient Goals and CMS Choice Patient states their goals for this hospitalization and ongoing recovery are:: to go back to ALF      Discharge Placement    [facility]            Patient to be transferred to facility by: Butch Penny from [facility]          Readmission Risk Interventions Readmission Risk Prevention Plan 03/05/2019  Transportation Screening Complete  PCP or Specialist Appt within 3-5 Days Complete  HRI or [place] Not Complete  Social Work Consult for [place] Planning/Counseling Not Complete  Palliative Care Screening Not Complete  Medication Review Press photographer) Complete  Some recent data might be hidden

## 2019-03-06 NOTE — Care Management Important Message (Signed)
Important Message  Patient Details  Name: Lisa Kemp MRN: [id number] Date of Birth: 1924-01-07   Medicare Important Message Given:  Yes     Tommy Medal 03/06/2019, 12:59 PM

## 2019-03-06 NOTE — Discharge Summary (Addendum)
Physician Discharge Summary  Lisa Kemp GEX:[id number] DOB: 1924-08-10 DOA: 03/04/2019  PCP: Glenda Chroman, MD  Admit date: 03/04/2019  Discharge date: 03/06/2019  Admitted From:ALF  Disposition:  ALF  Recommendations for Outpatient Follow-up:  1. Follow up with PCP in 1-2 weeks and repeat BMP as well as TSH levels. 2. Continue on Lasix 40 mg every day as prescribed with potassium supplementation 3. Continue on other medications as otherwise noted below  Home Health: None  Equipment/Devices: 2 L nasal cannula  Discharge Condition: Stable  CODE STATUS: DNR  Diet recommendation: Heart Healthy  Brief/Interim Summary: Per HPI: Lisa Shasteen Archibaldis a 85 y.o.femalewith medical history significant forCHF, Dementia, CAD, hypothyroidism, SDH, was brought to the ED, with above complaints. Patient has dementia and is not exactly sure why she was brought here, but she is awake and alert, answers simple questions. She tells me she has some difficulty breathing,swelling andtightness in her legs,some pain in her right groin, chest and abdomen. She is unable to tell me how long she has had thesesymptoms. She denies cough. Denies blood in stools or dark stools as far she knows. Medication lists Lasix,dose of which was increased from 20 mg to 40 mg daily for 5 days, ather last ED visit 02/12/2019,for lower extremity swelling, her BNP was elevated,butshe waswithout significant respiratory symptoms.She is on chronic O2.  Patient was admitted with lower extremity edema as well as dyspnea secondary to diastolic CHF exacerbation.  She has been started on IV Lasix for diuresis and appears to be improving.  She is doing much better on day of discharge and will be transitioned to Lasix 40 mg daily.  She will need follow-up BMP with her physician.  No other acute events noted during the course of this admission.  2D echocardiogram performed with LVEF 55 to 60% noted with elevated PASP  suggestive of moderate to severe pulmonary hypertension.  Discharge Diagnoses:  Active Problems:   Acute CHF (congestive heart failure) (HCC)   Acute diastolic (congestive) heart failure (HCC)  Principal discharge diagnosis: Acute on chronic diastolic CHF exacerbation.  Discharge Instructions   Allergies as of 03/06/2019      Reactions   Diazepam Other (See Comments)   Could not walk   Statins Other (See Comments)   CRAMPING      Medication List    TAKE these medications   acetaminophen 500 MG tablet Commonly known as: TYLENOL Take 500 mg by mouth every 8 (eight) hours as needed for mild pain or moderate pain.   aspirin 81 MG tablet Take 81 mg by mouth daily.   atenolol 25 MG tablet Commonly known as: TENORMIN TAKE ONE TABLET BY MOUTH TWICE DAILY   docusate sodium 100 MG capsule Commonly known as: COLACE Take 1 capsule (100 mg total) by mouth daily. Start taking on: March 07, 2019   ferrous sulfate 325 (65 FE) MG tablet Take 325 mg by mouth daily.   furosemide 40 MG tablet Commonly known as: Lasix Take 1 tablet (40 mg total) by mouth daily. What changed: Another medication with the same name was removed. Continue taking this medication, and follow the directions you see here.   gabapentin 100 MG capsule Commonly known as: NEURONTIN Take 1 capsule (100 mg total) by mouth at bedtime.   levothyroxine 150 MCG tablet Commonly known as: SYNTHROID Take 150 mcg by mouth daily before breakfast.   lisinopril 5 MG tablet Commonly known as: ZESTRIL Take 5 mg by mouth daily.   loperamide 2  MG tablet Commonly known as: IMODIUM A-D Take 2 mg by mouth 4 (four) times daily as needed for diarrhea or loose stools.   LORazepam 1 MG tablet Commonly known as: ATIVAN Take 0.5-1 mg by mouth See admin instructions. Take 0.5mg  by mouth daily in the evening. Take 1mg   At bedtime   nitroGLYCERIN 0.4 MG SL tablet Commonly known as: NITROSTAT Place 1 tablet (0.4 mg total) under  the tongue as directed.   omeprazole 40 MG capsule Commonly known as: PRILOSEC Take 40 mg by mouth daily.   ondansetron 4 MG tablet Commonly known as: ZOFRAN TAKE 1 TABLET BY MOUTH TWICE DAILY AS NEEDED FOR NAUSEA. What changed: See the new instructions.   oyster calcium 500 MG Tabs tablet Take 500 mg of elemental calcium by mouth 2 (two) times daily.   potassium chloride 10 MEQ tablet Commonly known as: K-DUR Take 1 tablet (10 mEq total) by mouth daily. What changed:   how much to take  how to take this  when to take this  additional instructions   simethicone 80 MG chewable tablet Commonly known as: MYLICON Chew 80 mg by mouth 2 (two) times daily.   trolamine salicylate 10 % cream Commonly known as: ASPERCREME Apply 1 application topically 2 (two) times daily as needed for muscle pain.   Tussin DM 10-100 MG/5ML liquid Generic drug: dextromethorphan-guaiFENesin Take 5 mLs by mouth every 6 (six) hours as needed for cough.   vitamin B-12 500 MCG tablet Commonly known as: CYANOCOBALAMIN Take 500 mcg by mouth daily.      Follow-up Information    Vyas, Dhruv B, MD Follow up in 1 week(s).   Specialty: Internal Medicine Contact information: [place] [address] [phone]        Satira Sark, MD .   Specialty: Cardiology Contact information: [place] [address] [phone]          Allergies  Allergen Reactions  . Diazepam Other (See Comments)    Could not walk  . Statins Other (See Comments)    CRAMPING    Consultations:  None   Procedures/Studies: Dg Chest Portable 1 View  Result Date: 03/04/2019 CLINICAL DATA:  Pt brought in by EMS from [facility]. EMS unclear why pt was sent. Staff stated that face swollen, hallucinations and leg pain with edema. Pt reports she does not know why she here. Pt has dementia EXAM: PORTABLE CHEST 1 VIEW COMPARISON:  02/12/2019 and older exams. FINDINGS: Cardiac silhouette  is mildly enlarged. No mediastinal or hilar masses. Presumed hiatal hernia projects at the right costophrenic angle, similar to prior exams. There are prominent bilateral vascular markings with linear opacities in the lung bases, the latter finding likely atelectasis. This appearance is similar to prior studies. Possible small effusions.  No pneumothorax. Skeletal structures are demineralized but grossly intact. IMPRESSION: 1. Chest radiograph appearance suggests a mild degree of congestive heart failure if there is shortness of breath. Probable small effusions. No convincing pneumonia. Electronically Signed   By: Lajean Manes M.D.   On: 03/04/2019 14:28   Dg Chest Portable 1 View  Result Date: 02/12/2019 CLINICAL DATA:  Increasing swelling.  Cough. EXAM: PORTABLE CHEST 1 VIEW COMPARISON:  September 11, 2018 FINDINGS: Stable cardiomegaly. The hila and mediastinum are unremarkable. Bilateral pleural effusions with underlying atelectasis. Mild pulmonary venous congestion. IMPRESSION: 1. Bilateral pleural effusions with underlying opacities, likely atelectasis. Pulmonary venous congestion. Electronically Signed   By: Dorise Bullion III M.D  On: 02/12/2019 09:45     Discharge Exam: Vitals:   03/05/19 2142 03/06/19 0556  BP: (!) 152/85 (!) 172/82  Pulse: 71 69  Resp: 18 18  Temp: (!) 97.5 F (36.4 C) (!) 97.4 F (36.3 C)  SpO2: 100% 100%   Vitals:   03/05/19 1321 03/05/19 2001 03/05/19 2142 03/06/19 0556  BP: (!) 152/79  (!) 152/85 (!) 172/82  Pulse: 70  71 69  Resp: 18  18 18   Temp: 98.2 F (36.8 C)  (!) 97.5 F (36.4 C) (!) 97.4 F (36.3 C)  TempSrc: Oral  Oral Oral  SpO2: 99% 99% 100% 100%  Weight:    76.9 kg  Height:        General: Pt is alert, awake, not in acute distress Cardiovascular: RRR, S1/S2 +, no rubs, no gallops Respiratory: CTA bilaterally, no wheezing, no rhonchi, 2 L nasal cannula Abdominal: Soft, NT, ND, bowel sounds + Extremities: no edema, no cyanosis    The  results of significant diagnostics from this hospitalization (including imaging, microbiology, ancillary and laboratory) are listed below for reference.     Microbiology: Recent Results (from the past 240 hour(s))  SARS Coronavirus 2 (CEPHEID - Performed in [place] hospital lab), Hosp Order     Status: None   Collection Time: 03/04/19  3:15 PM   Specimen: Nasopharyngeal Swab  Result Value Ref Range Status   SARS Coronavirus 2 NEGATIVE NEGATIVE Final    Comment: (NOTE) If result is NEGATIVE SARS-CoV-2 target nucleic acids are NOT DETECTED. The SARS-CoV-2 RNA is generally detectable in upper and lower  respiratory specimens during the acute phase of infection. The lowest  concentration of SARS-CoV-2 viral copies this assay can detect is 250  copies / mL. A negative result does not preclude SARS-CoV-2 infection  and should not be used as the sole basis for treatment or other  patient management decisions.  A negative result may occur with  improper specimen collection / handling, submission of specimen other  than nasopharyngeal swab, presence of viral mutation(s) within the  areas targeted by this assay, and inadequate number of viral copies  (<250 copies / mL). A negative result must be combined with clinical  observations, patient history, and epidemiological information. If result is POSITIVE SARS-CoV-2 target nucleic acids are DETECTED. The SARS-CoV-2 RNA is generally detectable in upper and lower  respiratory specimens dur ing the acute phase of infection.  Positive  results are indicative of active infection with SARS-CoV-2.  Clinical  correlation with patient history and other diagnostic information is  necessary to determine patient infection status.  Positive results do  not rule out bacterial infection or co-infection with other viruses. If result is PRESUMPTIVE POSTIVE SARS-CoV-2 nucleic acids MAY BE PRESENT.   A presumptive positive result was obtained on the  submitted specimen  and confirmed on repeat testing.  While 2019 novel coronavirus  (SARS-CoV-2) nucleic acids may be present in the submitted sample  additional confirmatory testing may be necessary for epidemiological  and / or clinical management purposes  to differentiate between  SARS-CoV-2 and other Sarbecovirus currently known to infect humans.  If clinically indicated additional testing with an alternate test  methodology [phone]) is advised. The SARS-CoV-2 RNA is generally  detectable in upper and lower respiratory sp ecimens during the acute  phase of infection. The expected result is Negative. Fact Sheet for Patients:  StrictlyIdeas.no Fact Sheet for Healthcare Providers: BankingDealers.co.za This test is not yet approved or cleared by the [place]  States FDA and has been authorized for detection and/or diagnosis of SARS-CoV-2 by FDA under an Emergency Use Authorization (EUA).  This EUA will remain in effect (meaning this test can be used) for the duration of the COVID-19 declaration under Section 564(b)(1) of the Act, 21 U.S.C. section 360bbb-3(b)(1), unless the authorization is terminated or revoked sooner. Performed at [facility], [address], [place], [place] [address]   MRSA PCR Screening     Status: None   Collection Time: 03/05/19  5:50 AM   Specimen: Nasopharyngeal  Result Value Ref Range Status   MRSA by PCR NEGATIVE NEGATIVE Final    Comment:        The GeneXpert MRSA Assay (FDA approved for NASAL specimens only), is one component of a comprehensive MRSA colonization surveillance program. It is not intended to diagnose MRSA infection nor to guide or monitor treatment for MRSA infections. Performed at [facility], [address], [place], [place] [address]      Labs: BNP (last 3 results) Recent Labs    09/11/18 2101 02/12/19 0932 03/04/19 1359  BNP 220.0* 623.0* 098.1*   Basic Metabolic  Panel: Recent Labs  Lab 03/04/19 1359 03/05/19 0451  NA 135 139  K 4.8 4.9  CL 92* 90*  CO2 37* 40*  GLUCOSE 155* 85  BUN 26* 26*  CREATININE 0.82 0.86  CALCIUM 8.9 9.2  MG 1.8  --    Liver Function Tests: Recent Labs  Lab 03/04/19 1359  AST 21  ALT 17  ALKPHOS 44  BILITOT 0.3  PROT 6.6  ALBUMIN 3.7   No results for input(s): LIPASE, AMYLASE in the last 168 hours. No results for input(s): AMMONIA in the last 168 hours. CBC: Recent Labs  Lab 03/04/19 1359 03/05/19 0451  WBC 4.1 4.4  NEUTROABS 2.5  --   HGB 10.1* 10.2*  HCT 35.0* 35.2*  MCV 99.7 97.8  PLT 141* 155   Cardiac Enzymes: No results for input(s): CKTOTAL, CKMB, CKMBINDEX, TROPONINI in the last 168 hours. BNP: Invalid input(s): POCBNP CBG: No results for input(s): GLUCAP in the last 168 hours. D-Dimer No results for input(s): DDIMER in the last 72 hours. Hgb A1c No results for input(s): HGBA1C in the last 72 hours. Lipid Profile No results for input(s): CHOL, HDL, LDLCALC, TRIG, CHOLHDL, LDLDIRECT in the last 72 hours. Thyroid function studies Recent Labs    03/04/19 1359  TSH 6.181*   Anemia work up Recent Labs    03/05/19 0451  FERRITIN 32  TIBC 267  IRON 25*   Urinalysis    Component Value Date/Time   COLORURINE YELLOW 02/12/2019 0906   APPEARANCEUR HAZY (A) 02/12/2019 0906   LABSPEC 1.012 02/12/2019 0906   PHURINE 6.0 02/12/2019 0906   GLUCOSEU NEGATIVE 02/12/2019 0906   HGBUR NEGATIVE 02/12/2019 0906   BILIRUBINUR NEGATIVE 02/12/2019 0906   KETONESUR NEGATIVE 02/12/2019 0906   PROTEINUR NEGATIVE 02/12/2019 0906   NITRITE NEGATIVE 02/12/2019 0906   LEUKOCYTESUR TRACE (A) 02/12/2019 0906   Sepsis Labs Invalid input(s): PROCALCITONIN,  WBC,  LACTICIDVEN Microbiology Recent Results (from the past 240 hour(s))  SARS Coronavirus 2 (CEPHEID - Performed in [place] hospital lab), Hosp Order     Status: None   Collection Time: 03/04/19  3:15 PM   Specimen: Nasopharyngeal  Swab  Result Value Ref Range Status   SARS Coronavirus 2 NEGATIVE NEGATIVE Final    Comment: (NOTE) If result is NEGATIVE SARS-CoV-2 target nucleic acids are NOT DETECTED. The SARS-CoV-2 RNA is  generally detectable in upper and lower  respiratory specimens during the acute phase of infection. The lowest  concentration of SARS-CoV-2 viral copies this assay can detect is 250  copies / mL. A negative result does not preclude SARS-CoV-2 infection  and should not be used as the sole basis for treatment or other  patient management decisions.  A negative result may occur with  improper specimen collection / handling, submission of specimen other  than nasopharyngeal swab, presence of viral mutation(s) within the  areas targeted by this assay, and inadequate number of viral copies  (<250 copies / mL). A negative result must be combined with clinical  observations, patient history, and epidemiological information. If result is POSITIVE SARS-CoV-2 target nucleic acids are DETECTED. The SARS-CoV-2 RNA is generally detectable in upper and lower  respiratory specimens dur ing the acute phase of infection.  Positive  results are indicative of active infection with SARS-CoV-2.  Clinical  correlation with patient history and other diagnostic information is  necessary to determine patient infection status.  Positive results do  not rule out bacterial infection or co-infection with other viruses. If result is PRESUMPTIVE POSTIVE SARS-CoV-2 nucleic acids MAY BE PRESENT.   A presumptive positive result was obtained on the submitted specimen  and confirmed on repeat testing.  While 2019 novel coronavirus  (SARS-CoV-2) nucleic acids may be present in the submitted sample  additional confirmatory testing may be necessary for epidemiological  and / or clinical management purposes  to differentiate between  SARS-CoV-2 and other Sarbecovirus currently known to infect humans.  If clinically indicated  additional testing with an alternate test  methodology [phone]) is advised. The SARS-CoV-2 RNA is generally  detectable in upper and lower respiratory sp ecimens during the acute  phase of infection. The expected result is Negative. Fact Sheet for Patients:  StrictlyIdeas.no Fact Sheet for Healthcare Providers: BankingDealers.co.za This test is not yet approved or cleared by the [place] FDA and has been authorized for detection and/or diagnosis of SARS-CoV-2 by FDA under an Emergency Use Authorization (EUA).  This EUA will remain in effect (meaning this test can be used) for the duration of the COVID-19 declaration under Section 564(b)(1) of the Act, 21 U.S.C. section 360bbb-3(b)(1), unless the authorization is terminated or revoked sooner. Performed at [facility], [address], [place], [place] [address]   MRSA PCR Screening     Status: None   Collection Time: 03/05/19  5:50 AM   Specimen: Nasopharyngeal  Result Value Ref Range Status   MRSA by PCR NEGATIVE NEGATIVE Final    Comment:        The GeneXpert MRSA Assay (FDA approved for NASAL specimens only), is one component of a comprehensive MRSA colonization surveillance program. It is not intended to diagnose MRSA infection nor to guide or monitor treatment for MRSA infections. Performed at [facility], [address] [place], [place] [address]      Time coordinating discharge: 35 minutes  SIGNED:   Rodena Goldmann, DO [facility] 03/06/2019, 10:37 AM  If 7PM-7AM, please contact night-coverage [email or web address] Password [email or web address]

## 2019-03-06 NOTE — Progress Notes (Signed)
Discharge received and explained to patient. Patient refused to have telemetry wires nor get dressed. Assistance received from [facility] [place] and then [facility] staff assisted with patient. Report given to Bennie Dallas at [facility] @ 1300.  Transferred via wheelchair to discharge final exit, at short stay. Elodia Florence RN 03/06/2019 1411

## 2019-03-06 NOTE — Progress Notes (Signed)
Patient refusing medications, paranoid thoughts pertaining to nursing staff " not doing me right" . Patient orients to president and does not answer other orientation questions appropriately stating, "It doesn't matter" each time. Doctor informed. Multiple attempts to explain and provide comfort for care given.  Elodia Florence RN 03/06/2019 1400

## 2019-03-06 NOTE — TOC Progression Note (Signed)
Transition of Care [facility]) - Progression Note    Patient Details  Name: Lisa Kemp MRN: [id number] Date of Birth: Mar 02, 1924  Transition of Care [facility]) CM/SW Contact  Boneta Lucks, RN Phone Number: 03/06/2019, 10:58 AM  Clinical Narrative:   Per MD patient will be ready for discharge on Saturday.  Spoke with Butch Penny at [employer]. They needed DC summary and FL2 today in order to get meds from pharmacy.  Updated FL2  And sent all DC clinicals to facility.     Expected Discharge Plan: Long Term Acute Care (LTAC) Barriers to Discharge: No Barriers Identified  Expected Discharge Plan and Services Expected Discharge Plan: Long Term Acute Care (LTAC)         Expected Discharge Date: 03/05/19                                     Social Determinants of Health (SDOH) Interventions    Readmission Risk Interventions Readmission Risk Prevention Plan 03/05/2019  Transportation Screening Complete  PCP or Specialist Appt within 3-5 Days Complete  HRI or [place] Not Complete  Social Work Consult for [place] Planning/Counseling Not Complete  Palliative Care Screening Not Complete  Medication Review Press photographer) Complete  Some recent data might be hidden

## 2019-03-09 DIAGNOSIS — I2510 Atherosclerotic heart disease of native coronary artery without angina pectoris: Secondary | ICD-10-CM | POA: Diagnosis not present

## 2019-03-09 DIAGNOSIS — J449 Chronic obstructive pulmonary disease, unspecified: Secondary | ICD-10-CM | POA: Diagnosis not present

## 2019-03-09 DIAGNOSIS — L03116 Cellulitis of left lower limb: Secondary | ICD-10-CM | POA: Diagnosis not present

## 2019-03-09 DIAGNOSIS — S80812D Abrasion, left lower leg, subsequent encounter: Secondary | ICD-10-CM | POA: Diagnosis not present

## 2019-03-09 DIAGNOSIS — E871 Hypo-osmolality and hyponatremia: Secondary | ICD-10-CM | POA: Diagnosis not present

## 2019-03-09 DIAGNOSIS — F0390 Unspecified dementia without behavioral disturbance: Secondary | ICD-10-CM | POA: Diagnosis not present

## 2019-03-10 DIAGNOSIS — I2510 Atherosclerotic heart disease of native coronary artery without angina pectoris: Secondary | ICD-10-CM | POA: Diagnosis not present

## 2019-03-10 DIAGNOSIS — S80812D Abrasion, left lower leg, subsequent encounter: Secondary | ICD-10-CM | POA: Diagnosis not present

## 2019-03-10 DIAGNOSIS — L03116 Cellulitis of left lower limb: Secondary | ICD-10-CM | POA: Diagnosis not present

## 2019-03-10 DIAGNOSIS — F0390 Unspecified dementia without behavioral disturbance: Secondary | ICD-10-CM | POA: Diagnosis not present

## 2019-03-10 DIAGNOSIS — J449 Chronic obstructive pulmonary disease, unspecified: Secondary | ICD-10-CM | POA: Diagnosis not present

## 2019-03-10 DIAGNOSIS — E871 Hypo-osmolality and hyponatremia: Secondary | ICD-10-CM | POA: Diagnosis not present

## 2019-03-12 DIAGNOSIS — M79674 Pain in right toe(s): Secondary | ICD-10-CM | POA: Diagnosis not present

## 2019-03-12 DIAGNOSIS — M79675 Pain in left toe(s): Secondary | ICD-10-CM | POA: Diagnosis not present

## 2019-03-12 DIAGNOSIS — B351 Tinea unguium: Secondary | ICD-10-CM | POA: Diagnosis not present

## 2019-03-13 DIAGNOSIS — H353131 Nonexudative age-related macular degeneration, bilateral, early dry stage: Secondary | ICD-10-CM | POA: Diagnosis not present

## 2019-03-16 DIAGNOSIS — I2510 Atherosclerotic heart disease of native coronary artery without angina pectoris: Secondary | ICD-10-CM | POA: Diagnosis not present

## 2019-03-16 DIAGNOSIS — S80812D Abrasion, left lower leg, subsequent encounter: Secondary | ICD-10-CM | POA: Diagnosis not present

## 2019-03-16 DIAGNOSIS — F0390 Unspecified dementia without behavioral disturbance: Secondary | ICD-10-CM | POA: Diagnosis not present

## 2019-03-16 DIAGNOSIS — J449 Chronic obstructive pulmonary disease, unspecified: Secondary | ICD-10-CM | POA: Diagnosis not present

## 2019-03-16 DIAGNOSIS — L03116 Cellulitis of left lower limb: Secondary | ICD-10-CM | POA: Diagnosis not present

## 2019-03-16 DIAGNOSIS — E871 Hypo-osmolality and hyponatremia: Secondary | ICD-10-CM | POA: Diagnosis not present

## 2019-03-17 DIAGNOSIS — I2510 Atherosclerotic heart disease of native coronary artery without angina pectoris: Secondary | ICD-10-CM | POA: Diagnosis not present

## 2019-03-17 DIAGNOSIS — E871 Hypo-osmolality and hyponatremia: Secondary | ICD-10-CM | POA: Diagnosis not present

## 2019-03-17 DIAGNOSIS — L03116 Cellulitis of left lower limb: Secondary | ICD-10-CM | POA: Diagnosis not present

## 2019-03-17 DIAGNOSIS — J449 Chronic obstructive pulmonary disease, unspecified: Secondary | ICD-10-CM | POA: Diagnosis not present

## 2019-03-18 DIAGNOSIS — L03116 Cellulitis of left lower limb: Secondary | ICD-10-CM | POA: Diagnosis not present

## 2019-03-18 DIAGNOSIS — J449 Chronic obstructive pulmonary disease, unspecified: Secondary | ICD-10-CM | POA: Diagnosis not present

## 2019-03-18 DIAGNOSIS — E871 Hypo-osmolality and hyponatremia: Secondary | ICD-10-CM | POA: Diagnosis not present

## 2019-03-18 DIAGNOSIS — F0390 Unspecified dementia without behavioral disturbance: Secondary | ICD-10-CM | POA: Diagnosis not present

## 2019-03-18 DIAGNOSIS — I2510 Atherosclerotic heart disease of native coronary artery without angina pectoris: Secondary | ICD-10-CM | POA: Diagnosis not present

## 2019-03-18 DIAGNOSIS — S80812D Abrasion, left lower leg, subsequent encounter: Secondary | ICD-10-CM | POA: Diagnosis not present

## 2019-03-20 DIAGNOSIS — Z299 Encounter for prophylactic measures, unspecified: Secondary | ICD-10-CM | POA: Diagnosis not present

## 2019-03-20 DIAGNOSIS — D500 Iron deficiency anemia secondary to blood loss (chronic): Secondary | ICD-10-CM | POA: Diagnosis not present

## 2019-03-20 DIAGNOSIS — I2510 Atherosclerotic heart disease of native coronary artery without angina pectoris: Secondary | ICD-10-CM | POA: Diagnosis not present

## 2019-03-20 DIAGNOSIS — Z1331 Encounter for screening for depression: Secondary | ICD-10-CM | POA: Diagnosis not present

## 2019-03-20 DIAGNOSIS — Z6830 Body mass index (BMI) 30.0-30.9, adult: Secondary | ICD-10-CM | POA: Diagnosis not present

## 2019-03-20 DIAGNOSIS — Z0000 Encounter for general adult medical examination without abnormal findings: Secondary | ICD-10-CM | POA: Diagnosis not present

## 2019-03-20 DIAGNOSIS — F0390 Unspecified dementia without behavioral disturbance: Secondary | ICD-10-CM | POA: Diagnosis not present

## 2019-03-20 DIAGNOSIS — Z79899 Other long term (current) drug therapy: Secondary | ICD-10-CM | POA: Diagnosis not present

## 2019-03-20 DIAGNOSIS — Z1339 Encounter for screening examination for other mental health and behavioral disorders: Secondary | ICD-10-CM | POA: Diagnosis not present

## 2019-03-20 DIAGNOSIS — E039 Hypothyroidism, unspecified: Secondary | ICD-10-CM | POA: Diagnosis not present

## 2019-03-20 DIAGNOSIS — Z7189 Other specified counseling: Secondary | ICD-10-CM | POA: Diagnosis not present

## 2019-03-23 DIAGNOSIS — I2510 Atherosclerotic heart disease of native coronary artery without angina pectoris: Secondary | ICD-10-CM | POA: Diagnosis not present

## 2019-03-23 DIAGNOSIS — L03116 Cellulitis of left lower limb: Secondary | ICD-10-CM | POA: Diagnosis not present

## 2019-03-23 DIAGNOSIS — F0390 Unspecified dementia without behavioral disturbance: Secondary | ICD-10-CM | POA: Diagnosis not present

## 2019-03-23 DIAGNOSIS — S80812D Abrasion, left lower leg, subsequent encounter: Secondary | ICD-10-CM | POA: Diagnosis not present

## 2019-03-23 DIAGNOSIS — J449 Chronic obstructive pulmonary disease, unspecified: Secondary | ICD-10-CM | POA: Diagnosis not present

## 2019-03-23 DIAGNOSIS — E871 Hypo-osmolality and hyponatremia: Secondary | ICD-10-CM | POA: Diagnosis not present

## 2019-03-24 DIAGNOSIS — F0390 Unspecified dementia without behavioral disturbance: Secondary | ICD-10-CM | POA: Diagnosis not present

## 2019-03-24 DIAGNOSIS — E871 Hypo-osmolality and hyponatremia: Secondary | ICD-10-CM | POA: Diagnosis not present

## 2019-03-24 DIAGNOSIS — L03116 Cellulitis of left lower limb: Secondary | ICD-10-CM | POA: Diagnosis not present

## 2019-03-24 DIAGNOSIS — S80812D Abrasion, left lower leg, subsequent encounter: Secondary | ICD-10-CM | POA: Diagnosis not present

## 2019-03-24 DIAGNOSIS — I2510 Atherosclerotic heart disease of native coronary artery without angina pectoris: Secondary | ICD-10-CM | POA: Diagnosis not present

## 2019-03-24 DIAGNOSIS — J449 Chronic obstructive pulmonary disease, unspecified: Secondary | ICD-10-CM | POA: Diagnosis not present

## 2019-03-25 DIAGNOSIS — E871 Hypo-osmolality and hyponatremia: Secondary | ICD-10-CM | POA: Diagnosis not present

## 2019-03-25 DIAGNOSIS — F0390 Unspecified dementia without behavioral disturbance: Secondary | ICD-10-CM | POA: Diagnosis not present

## 2019-03-25 DIAGNOSIS — L03116 Cellulitis of left lower limb: Secondary | ICD-10-CM | POA: Diagnosis not present

## 2019-03-25 DIAGNOSIS — I2510 Atherosclerotic heart disease of native coronary artery without angina pectoris: Secondary | ICD-10-CM | POA: Diagnosis not present

## 2019-03-25 DIAGNOSIS — S80812D Abrasion, left lower leg, subsequent encounter: Secondary | ICD-10-CM | POA: Diagnosis not present

## 2019-03-25 DIAGNOSIS — J449 Chronic obstructive pulmonary disease, unspecified: Secondary | ICD-10-CM | POA: Diagnosis not present

## 2019-03-26 DIAGNOSIS — E871 Hypo-osmolality and hyponatremia: Secondary | ICD-10-CM | POA: Diagnosis not present

## 2019-03-26 DIAGNOSIS — S80812D Abrasion, left lower leg, subsequent encounter: Secondary | ICD-10-CM | POA: Diagnosis not present

## 2019-03-26 DIAGNOSIS — I6523 Occlusion and stenosis of bilateral carotid arteries: Secondary | ICD-10-CM | POA: Diagnosis not present

## 2019-03-26 DIAGNOSIS — I2510 Atherosclerotic heart disease of native coronary artery without angina pectoris: Secondary | ICD-10-CM | POA: Diagnosis not present

## 2019-03-26 DIAGNOSIS — G609 Hereditary and idiopathic neuropathy, unspecified: Secondary | ICD-10-CM | POA: Diagnosis not present

## 2019-03-26 DIAGNOSIS — R2689 Other abnormalities of gait and mobility: Secondary | ICD-10-CM | POA: Diagnosis not present

## 2019-03-26 DIAGNOSIS — Z87820 Personal history of traumatic brain injury: Secondary | ICD-10-CM | POA: Diagnosis not present

## 2019-03-26 DIAGNOSIS — I5033 Acute on chronic diastolic (congestive) heart failure: Secondary | ICD-10-CM | POA: Diagnosis not present

## 2019-03-26 DIAGNOSIS — J449 Chronic obstructive pulmonary disease, unspecified: Secondary | ICD-10-CM | POA: Diagnosis not present

## 2019-03-26 DIAGNOSIS — M1712 Unilateral primary osteoarthritis, left knee: Secondary | ICD-10-CM | POA: Diagnosis not present

## 2019-03-26 DIAGNOSIS — F0390 Unspecified dementia without behavioral disturbance: Secondary | ICD-10-CM | POA: Diagnosis not present

## 2019-03-26 DIAGNOSIS — L03116 Cellulitis of left lower limb: Secondary | ICD-10-CM | POA: Diagnosis not present

## 2019-03-26 DIAGNOSIS — Z85828 Personal history of other malignant neoplasm of skin: Secondary | ICD-10-CM | POA: Diagnosis not present

## 2019-04-06 DIAGNOSIS — S80812D Abrasion, left lower leg, subsequent encounter: Secondary | ICD-10-CM | POA: Diagnosis not present

## 2019-04-06 DIAGNOSIS — J449 Chronic obstructive pulmonary disease, unspecified: Secondary | ICD-10-CM | POA: Diagnosis not present

## 2019-04-06 DIAGNOSIS — I5033 Acute on chronic diastolic (congestive) heart failure: Secondary | ICD-10-CM | POA: Diagnosis not present

## 2019-04-06 DIAGNOSIS — L03116 Cellulitis of left lower limb: Secondary | ICD-10-CM | POA: Diagnosis not present

## 2019-04-06 DIAGNOSIS — E871 Hypo-osmolality and hyponatremia: Secondary | ICD-10-CM | POA: Diagnosis not present

## 2019-04-06 DIAGNOSIS — I2510 Atherosclerotic heart disease of native coronary artery without angina pectoris: Secondary | ICD-10-CM | POA: Diagnosis not present

## 2019-04-13 DIAGNOSIS — S80812D Abrasion, left lower leg, subsequent encounter: Secondary | ICD-10-CM | POA: Diagnosis not present

## 2019-04-13 DIAGNOSIS — J449 Chronic obstructive pulmonary disease, unspecified: Secondary | ICD-10-CM | POA: Diagnosis not present

## 2019-04-13 DIAGNOSIS — E871 Hypo-osmolality and hyponatremia: Secondary | ICD-10-CM | POA: Diagnosis not present

## 2019-04-13 DIAGNOSIS — I2510 Atherosclerotic heart disease of native coronary artery without angina pectoris: Secondary | ICD-10-CM | POA: Diagnosis not present

## 2019-04-13 DIAGNOSIS — L03116 Cellulitis of left lower limb: Secondary | ICD-10-CM | POA: Diagnosis not present

## 2019-04-13 DIAGNOSIS — I5033 Acute on chronic diastolic (congestive) heart failure: Secondary | ICD-10-CM | POA: Diagnosis not present

## 2019-04-15 DIAGNOSIS — I5033 Acute on chronic diastolic (congestive) heart failure: Secondary | ICD-10-CM | POA: Diagnosis not present

## 2019-04-15 DIAGNOSIS — E871 Hypo-osmolality and hyponatremia: Secondary | ICD-10-CM | POA: Diagnosis not present

## 2019-04-15 DIAGNOSIS — I2510 Atherosclerotic heart disease of native coronary artery without angina pectoris: Secondary | ICD-10-CM | POA: Diagnosis not present

## 2019-04-15 DIAGNOSIS — L03116 Cellulitis of left lower limb: Secondary | ICD-10-CM | POA: Diagnosis not present

## 2019-04-15 DIAGNOSIS — J449 Chronic obstructive pulmonary disease, unspecified: Secondary | ICD-10-CM | POA: Diagnosis not present

## 2019-04-15 DIAGNOSIS — S80812D Abrasion, left lower leg, subsequent encounter: Secondary | ICD-10-CM | POA: Diagnosis not present

## 2019-04-16 DIAGNOSIS — I5033 Acute on chronic diastolic (congestive) heart failure: Secondary | ICD-10-CM | POA: Diagnosis not present

## 2019-04-16 DIAGNOSIS — E871 Hypo-osmolality and hyponatremia: Secondary | ICD-10-CM | POA: Diagnosis not present

## 2019-04-16 DIAGNOSIS — J449 Chronic obstructive pulmonary disease, unspecified: Secondary | ICD-10-CM | POA: Diagnosis not present

## 2019-04-16 DIAGNOSIS — S80812D Abrasion, left lower leg, subsequent encounter: Secondary | ICD-10-CM | POA: Diagnosis not present

## 2019-04-16 DIAGNOSIS — I2510 Atherosclerotic heart disease of native coronary artery without angina pectoris: Secondary | ICD-10-CM | POA: Diagnosis not present

## 2019-04-16 DIAGNOSIS — L03116 Cellulitis of left lower limb: Secondary | ICD-10-CM | POA: Diagnosis not present

## 2019-04-20 DIAGNOSIS — E871 Hypo-osmolality and hyponatremia: Secondary | ICD-10-CM | POA: Diagnosis not present

## 2019-04-20 DIAGNOSIS — I5033 Acute on chronic diastolic (congestive) heart failure: Secondary | ICD-10-CM | POA: Diagnosis not present

## 2019-04-20 DIAGNOSIS — S80812D Abrasion, left lower leg, subsequent encounter: Secondary | ICD-10-CM | POA: Diagnosis not present

## 2019-04-20 DIAGNOSIS — L03116 Cellulitis of left lower limb: Secondary | ICD-10-CM | POA: Diagnosis not present

## 2019-04-20 DIAGNOSIS — J449 Chronic obstructive pulmonary disease, unspecified: Secondary | ICD-10-CM | POA: Diagnosis not present

## 2019-04-20 DIAGNOSIS — I2510 Atherosclerotic heart disease of native coronary artery without angina pectoris: Secondary | ICD-10-CM | POA: Diagnosis not present

## 2019-04-21 DIAGNOSIS — L03116 Cellulitis of left lower limb: Secondary | ICD-10-CM | POA: Diagnosis not present

## 2019-04-21 DIAGNOSIS — S80812D Abrasion, left lower leg, subsequent encounter: Secondary | ICD-10-CM | POA: Diagnosis not present

## 2019-04-21 DIAGNOSIS — I5033 Acute on chronic diastolic (congestive) heart failure: Secondary | ICD-10-CM | POA: Diagnosis not present

## 2019-04-21 DIAGNOSIS — E871 Hypo-osmolality and hyponatremia: Secondary | ICD-10-CM | POA: Diagnosis not present

## 2019-04-21 DIAGNOSIS — I2510 Atherosclerotic heart disease of native coronary artery without angina pectoris: Secondary | ICD-10-CM | POA: Diagnosis not present

## 2019-04-21 DIAGNOSIS — J449 Chronic obstructive pulmonary disease, unspecified: Secondary | ICD-10-CM | POA: Diagnosis not present

## 2019-04-25 DIAGNOSIS — F0390 Unspecified dementia without behavioral disturbance: Secondary | ICD-10-CM | POA: Diagnosis not present

## 2019-04-25 DIAGNOSIS — I2510 Atherosclerotic heart disease of native coronary artery without angina pectoris: Secondary | ICD-10-CM | POA: Diagnosis not present

## 2019-04-25 DIAGNOSIS — Z85828 Personal history of other malignant neoplasm of skin: Secondary | ICD-10-CM | POA: Diagnosis not present

## 2019-04-25 DIAGNOSIS — I6523 Occlusion and stenosis of bilateral carotid arteries: Secondary | ICD-10-CM | POA: Diagnosis not present

## 2019-04-25 DIAGNOSIS — J449 Chronic obstructive pulmonary disease, unspecified: Secondary | ICD-10-CM | POA: Diagnosis not present

## 2019-04-25 DIAGNOSIS — G609 Hereditary and idiopathic neuropathy, unspecified: Secondary | ICD-10-CM | POA: Diagnosis not present

## 2019-04-25 DIAGNOSIS — M1712 Unilateral primary osteoarthritis, left knee: Secondary | ICD-10-CM | POA: Diagnosis not present

## 2019-04-25 DIAGNOSIS — I5033 Acute on chronic diastolic (congestive) heart failure: Secondary | ICD-10-CM | POA: Diagnosis not present

## 2019-04-25 DIAGNOSIS — R2689 Other abnormalities of gait and mobility: Secondary | ICD-10-CM | POA: Diagnosis not present

## 2019-04-25 DIAGNOSIS — Z9981 Dependence on supplemental oxygen: Secondary | ICD-10-CM | POA: Diagnosis not present

## 2019-04-25 DIAGNOSIS — Z87820 Personal history of traumatic brain injury: Secondary | ICD-10-CM | POA: Diagnosis not present

## 2019-04-29 DIAGNOSIS — I6523 Occlusion and stenosis of bilateral carotid arteries: Secondary | ICD-10-CM | POA: Diagnosis not present

## 2019-04-29 DIAGNOSIS — I5033 Acute on chronic diastolic (congestive) heart failure: Secondary | ICD-10-CM | POA: Diagnosis not present

## 2019-04-29 DIAGNOSIS — I2510 Atherosclerotic heart disease of native coronary artery without angina pectoris: Secondary | ICD-10-CM | POA: Diagnosis not present

## 2019-04-29 DIAGNOSIS — F0390 Unspecified dementia without behavioral disturbance: Secondary | ICD-10-CM | POA: Diagnosis not present

## 2019-04-29 DIAGNOSIS — M1712 Unilateral primary osteoarthritis, left knee: Secondary | ICD-10-CM | POA: Diagnosis not present

## 2019-04-29 DIAGNOSIS — J449 Chronic obstructive pulmonary disease, unspecified: Secondary | ICD-10-CM | POA: Diagnosis not present

## 2019-04-30 DIAGNOSIS — I6523 Occlusion and stenosis of bilateral carotid arteries: Secondary | ICD-10-CM | POA: Diagnosis not present

## 2019-04-30 DIAGNOSIS — I5033 Acute on chronic diastolic (congestive) heart failure: Secondary | ICD-10-CM | POA: Diagnosis not present

## 2019-04-30 DIAGNOSIS — I2510 Atherosclerotic heart disease of native coronary artery without angina pectoris: Secondary | ICD-10-CM | POA: Diagnosis not present

## 2019-04-30 DIAGNOSIS — J449 Chronic obstructive pulmonary disease, unspecified: Secondary | ICD-10-CM | POA: Diagnosis not present

## 2019-04-30 DIAGNOSIS — M1712 Unilateral primary osteoarthritis, left knee: Secondary | ICD-10-CM | POA: Diagnosis not present

## 2019-04-30 DIAGNOSIS — F0390 Unspecified dementia without behavioral disturbance: Secondary | ICD-10-CM | POA: Diagnosis not present

## 2019-05-05 DIAGNOSIS — I6523 Occlusion and stenosis of bilateral carotid arteries: Secondary | ICD-10-CM | POA: Diagnosis not present

## 2019-05-05 DIAGNOSIS — I5033 Acute on chronic diastolic (congestive) heart failure: Secondary | ICD-10-CM | POA: Diagnosis not present

## 2019-05-05 DIAGNOSIS — I2510 Atherosclerotic heart disease of native coronary artery without angina pectoris: Secondary | ICD-10-CM | POA: Diagnosis not present

## 2019-05-05 DIAGNOSIS — M1712 Unilateral primary osteoarthritis, left knee: Secondary | ICD-10-CM | POA: Diagnosis not present

## 2019-05-05 DIAGNOSIS — J449 Chronic obstructive pulmonary disease, unspecified: Secondary | ICD-10-CM | POA: Diagnosis not present

## 2019-05-05 DIAGNOSIS — F0390 Unspecified dementia without behavioral disturbance: Secondary | ICD-10-CM | POA: Diagnosis not present

## 2019-05-06 DIAGNOSIS — E039 Hypothyroidism, unspecified: Secondary | ICD-10-CM | POA: Diagnosis not present

## 2019-05-11 DIAGNOSIS — I2510 Atherosclerotic heart disease of native coronary artery without angina pectoris: Secondary | ICD-10-CM | POA: Diagnosis not present

## 2019-05-11 DIAGNOSIS — I6523 Occlusion and stenosis of bilateral carotid arteries: Secondary | ICD-10-CM | POA: Diagnosis not present

## 2019-05-11 DIAGNOSIS — M1712 Unilateral primary osteoarthritis, left knee: Secondary | ICD-10-CM | POA: Diagnosis not present

## 2019-05-11 DIAGNOSIS — F0390 Unspecified dementia without behavioral disturbance: Secondary | ICD-10-CM | POA: Diagnosis not present

## 2019-05-11 DIAGNOSIS — J449 Chronic obstructive pulmonary disease, unspecified: Secondary | ICD-10-CM | POA: Diagnosis not present

## 2019-05-11 DIAGNOSIS — I5033 Acute on chronic diastolic (congestive) heart failure: Secondary | ICD-10-CM | POA: Diagnosis not present

## 2019-05-13 DIAGNOSIS — I5033 Acute on chronic diastolic (congestive) heart failure: Secondary | ICD-10-CM | POA: Diagnosis not present

## 2019-05-13 DIAGNOSIS — F0390 Unspecified dementia without behavioral disturbance: Secondary | ICD-10-CM | POA: Diagnosis not present

## 2019-05-13 DIAGNOSIS — I6523 Occlusion and stenosis of bilateral carotid arteries: Secondary | ICD-10-CM | POA: Diagnosis not present

## 2019-05-13 DIAGNOSIS — I2510 Atherosclerotic heart disease of native coronary artery without angina pectoris: Secondary | ICD-10-CM | POA: Diagnosis not present

## 2019-05-13 DIAGNOSIS — M1712 Unilateral primary osteoarthritis, left knee: Secondary | ICD-10-CM | POA: Diagnosis not present

## 2019-05-13 DIAGNOSIS — J449 Chronic obstructive pulmonary disease, unspecified: Secondary | ICD-10-CM | POA: Diagnosis not present

## 2019-05-18 DIAGNOSIS — I2510 Atherosclerotic heart disease of native coronary artery without angina pectoris: Secondary | ICD-10-CM | POA: Diagnosis not present

## 2019-05-18 DIAGNOSIS — M1712 Unilateral primary osteoarthritis, left knee: Secondary | ICD-10-CM | POA: Diagnosis not present

## 2019-05-18 DIAGNOSIS — F0390 Unspecified dementia without behavioral disturbance: Secondary | ICD-10-CM | POA: Diagnosis not present

## 2019-05-18 DIAGNOSIS — I5033 Acute on chronic diastolic (congestive) heart failure: Secondary | ICD-10-CM | POA: Diagnosis not present

## 2019-05-18 DIAGNOSIS — I6523 Occlusion and stenosis of bilateral carotid arteries: Secondary | ICD-10-CM | POA: Diagnosis not present

## 2019-05-18 DIAGNOSIS — J449 Chronic obstructive pulmonary disease, unspecified: Secondary | ICD-10-CM | POA: Diagnosis not present

## 2019-05-25 DIAGNOSIS — Z87820 Personal history of traumatic brain injury: Secondary | ICD-10-CM | POA: Diagnosis not present

## 2019-05-25 DIAGNOSIS — Z9981 Dependence on supplemental oxygen: Secondary | ICD-10-CM | POA: Diagnosis not present

## 2019-05-25 DIAGNOSIS — Z299 Encounter for prophylactic measures, unspecified: Secondary | ICD-10-CM | POA: Diagnosis not present

## 2019-05-25 DIAGNOSIS — I2510 Atherosclerotic heart disease of native coronary artery without angina pectoris: Secondary | ICD-10-CM | POA: Diagnosis not present

## 2019-05-25 DIAGNOSIS — G609 Hereditary and idiopathic neuropathy, unspecified: Secondary | ICD-10-CM | POA: Diagnosis not present

## 2019-05-25 DIAGNOSIS — R197 Diarrhea, unspecified: Secondary | ICD-10-CM | POA: Diagnosis not present

## 2019-05-25 DIAGNOSIS — M1710 Unilateral primary osteoarthritis, unspecified knee: Secondary | ICD-10-CM | POA: Diagnosis not present

## 2019-05-25 DIAGNOSIS — F0390 Unspecified dementia without behavioral disturbance: Secondary | ICD-10-CM | POA: Diagnosis not present

## 2019-05-25 DIAGNOSIS — J449 Chronic obstructive pulmonary disease, unspecified: Secondary | ICD-10-CM | POA: Diagnosis not present

## 2019-05-25 DIAGNOSIS — Z23 Encounter for immunization: Secondary | ICD-10-CM | POA: Diagnosis not present

## 2019-05-25 DIAGNOSIS — I10 Essential (primary) hypertension: Secondary | ICD-10-CM | POA: Diagnosis not present

## 2019-05-25 DIAGNOSIS — M1712 Unilateral primary osteoarthritis, left knee: Secondary | ICD-10-CM | POA: Diagnosis not present

## 2019-05-25 DIAGNOSIS — Z85828 Personal history of other malignant neoplasm of skin: Secondary | ICD-10-CM | POA: Diagnosis not present

## 2019-05-25 DIAGNOSIS — I6523 Occlusion and stenosis of bilateral carotid arteries: Secondary | ICD-10-CM | POA: Diagnosis not present

## 2019-05-25 DIAGNOSIS — Z6830 Body mass index (BMI) 30.0-30.9, adult: Secondary | ICD-10-CM | POA: Diagnosis not present

## 2019-05-25 DIAGNOSIS — E039 Hypothyroidism, unspecified: Secondary | ICD-10-CM | POA: Diagnosis not present

## 2019-05-25 DIAGNOSIS — R2689 Other abnormalities of gait and mobility: Secondary | ICD-10-CM | POA: Diagnosis not present

## 2019-05-25 DIAGNOSIS — I5033 Acute on chronic diastolic (congestive) heart failure: Secondary | ICD-10-CM | POA: Diagnosis not present

## 2019-05-26 DIAGNOSIS — J449 Chronic obstructive pulmonary disease, unspecified: Secondary | ICD-10-CM | POA: Diagnosis not present

## 2019-05-26 DIAGNOSIS — I6523 Occlusion and stenosis of bilateral carotid arteries: Secondary | ICD-10-CM | POA: Diagnosis not present

## 2019-05-26 DIAGNOSIS — B351 Tinea unguium: Secondary | ICD-10-CM | POA: Diagnosis not present

## 2019-05-26 DIAGNOSIS — M79674 Pain in right toe(s): Secondary | ICD-10-CM | POA: Diagnosis not present

## 2019-05-26 DIAGNOSIS — I2510 Atherosclerotic heart disease of native coronary artery without angina pectoris: Secondary | ICD-10-CM | POA: Diagnosis not present

## 2019-05-26 DIAGNOSIS — I5033 Acute on chronic diastolic (congestive) heart failure: Secondary | ICD-10-CM | POA: Diagnosis not present

## 2019-05-26 DIAGNOSIS — M79675 Pain in left toe(s): Secondary | ICD-10-CM | POA: Diagnosis not present

## 2019-05-26 DIAGNOSIS — M1712 Unilateral primary osteoarthritis, left knee: Secondary | ICD-10-CM | POA: Diagnosis not present

## 2019-05-26 DIAGNOSIS — F0390 Unspecified dementia without behavioral disturbance: Secondary | ICD-10-CM | POA: Diagnosis not present

## 2019-06-01 DIAGNOSIS — F0390 Unspecified dementia without behavioral disturbance: Secondary | ICD-10-CM | POA: Diagnosis not present

## 2019-06-01 DIAGNOSIS — I5033 Acute on chronic diastolic (congestive) heart failure: Secondary | ICD-10-CM | POA: Diagnosis not present

## 2019-06-01 DIAGNOSIS — M1712 Unilateral primary osteoarthritis, left knee: Secondary | ICD-10-CM | POA: Diagnosis not present

## 2019-06-01 DIAGNOSIS — J449 Chronic obstructive pulmonary disease, unspecified: Secondary | ICD-10-CM | POA: Diagnosis not present

## 2019-06-01 DIAGNOSIS — I6523 Occlusion and stenosis of bilateral carotid arteries: Secondary | ICD-10-CM | POA: Diagnosis not present

## 2019-06-01 DIAGNOSIS — I2510 Atherosclerotic heart disease of native coronary artery without angina pectoris: Secondary | ICD-10-CM | POA: Diagnosis not present

## 2019-06-09 DIAGNOSIS — M25562 Pain in left knee: Secondary | ICD-10-CM | POA: Diagnosis not present

## 2019-06-09 DIAGNOSIS — Z6830 Body mass index (BMI) 30.0-30.9, adult: Secondary | ICD-10-CM | POA: Diagnosis not present

## 2019-06-09 DIAGNOSIS — J449 Chronic obstructive pulmonary disease, unspecified: Secondary | ICD-10-CM | POA: Diagnosis not present

## 2019-06-09 DIAGNOSIS — M1712 Unilateral primary osteoarthritis, left knee: Secondary | ICD-10-CM | POA: Diagnosis not present

## 2019-06-09 DIAGNOSIS — I5033 Acute on chronic diastolic (congestive) heart failure: Secondary | ICD-10-CM | POA: Diagnosis not present

## 2019-06-09 DIAGNOSIS — M25561 Pain in right knee: Secondary | ICD-10-CM | POA: Diagnosis not present

## 2019-06-09 DIAGNOSIS — M1710 Unilateral primary osteoarthritis, unspecified knee: Secondary | ICD-10-CM | POA: Diagnosis not present

## 2019-06-09 DIAGNOSIS — I2510 Atherosclerotic heart disease of native coronary artery without angina pectoris: Secondary | ICD-10-CM | POA: Diagnosis not present

## 2019-06-09 DIAGNOSIS — I6523 Occlusion and stenosis of bilateral carotid arteries: Secondary | ICD-10-CM | POA: Diagnosis not present

## 2019-06-09 DIAGNOSIS — F0390 Unspecified dementia without behavioral disturbance: Secondary | ICD-10-CM | POA: Diagnosis not present

## 2019-06-09 DIAGNOSIS — I10 Essential (primary) hypertension: Secondary | ICD-10-CM | POA: Diagnosis not present

## 2019-06-09 DIAGNOSIS — Z299 Encounter for prophylactic measures, unspecified: Secondary | ICD-10-CM | POA: Diagnosis not present

## 2019-06-10 DIAGNOSIS — M1712 Unilateral primary osteoarthritis, left knee: Secondary | ICD-10-CM | POA: Diagnosis not present

## 2019-06-10 DIAGNOSIS — J449 Chronic obstructive pulmonary disease, unspecified: Secondary | ICD-10-CM | POA: Diagnosis not present

## 2019-06-10 DIAGNOSIS — I2510 Atherosclerotic heart disease of native coronary artery without angina pectoris: Secondary | ICD-10-CM | POA: Diagnosis not present

## 2019-06-10 DIAGNOSIS — F0390 Unspecified dementia without behavioral disturbance: Secondary | ICD-10-CM | POA: Diagnosis not present

## 2019-06-10 DIAGNOSIS — I6523 Occlusion and stenosis of bilateral carotid arteries: Secondary | ICD-10-CM | POA: Diagnosis not present

## 2019-06-10 DIAGNOSIS — I5033 Acute on chronic diastolic (congestive) heart failure: Secondary | ICD-10-CM | POA: Diagnosis not present

## 2019-06-15 DIAGNOSIS — I2510 Atherosclerotic heart disease of native coronary artery without angina pectoris: Secondary | ICD-10-CM | POA: Diagnosis not present

## 2019-06-15 DIAGNOSIS — F0390 Unspecified dementia without behavioral disturbance: Secondary | ICD-10-CM | POA: Diagnosis not present

## 2019-06-15 DIAGNOSIS — I5033 Acute on chronic diastolic (congestive) heart failure: Secondary | ICD-10-CM | POA: Diagnosis not present

## 2019-06-15 DIAGNOSIS — I6523 Occlusion and stenosis of bilateral carotid arteries: Secondary | ICD-10-CM | POA: Diagnosis not present

## 2019-06-15 DIAGNOSIS — J449 Chronic obstructive pulmonary disease, unspecified: Secondary | ICD-10-CM | POA: Diagnosis not present

## 2019-06-15 DIAGNOSIS — M1712 Unilateral primary osteoarthritis, left knee: Secondary | ICD-10-CM | POA: Diagnosis not present

## 2019-06-17 DIAGNOSIS — I6523 Occlusion and stenosis of bilateral carotid arteries: Secondary | ICD-10-CM | POA: Diagnosis not present

## 2019-06-17 DIAGNOSIS — F0390 Unspecified dementia without behavioral disturbance: Secondary | ICD-10-CM | POA: Diagnosis not present

## 2019-06-17 DIAGNOSIS — M1712 Unilateral primary osteoarthritis, left knee: Secondary | ICD-10-CM | POA: Diagnosis not present

## 2019-06-17 DIAGNOSIS — J449 Chronic obstructive pulmonary disease, unspecified: Secondary | ICD-10-CM | POA: Diagnosis not present

## 2019-06-17 DIAGNOSIS — I5033 Acute on chronic diastolic (congestive) heart failure: Secondary | ICD-10-CM | POA: Diagnosis not present

## 2019-06-17 DIAGNOSIS — I2510 Atherosclerotic heart disease of native coronary artery without angina pectoris: Secondary | ICD-10-CM | POA: Diagnosis not present

## 2019-06-19 DIAGNOSIS — M25469 Effusion, unspecified knee: Secondary | ICD-10-CM | POA: Diagnosis not present

## 2019-06-19 DIAGNOSIS — M1712 Unilateral primary osteoarthritis, left knee: Secondary | ICD-10-CM | POA: Diagnosis not present

## 2019-06-19 DIAGNOSIS — M25561 Pain in right knee: Secondary | ICD-10-CM | POA: Diagnosis not present

## 2019-06-19 DIAGNOSIS — M1711 Unilateral primary osteoarthritis, right knee: Secondary | ICD-10-CM | POA: Diagnosis not present

## 2019-06-19 DIAGNOSIS — M25562 Pain in left knee: Secondary | ICD-10-CM | POA: Diagnosis not present

## 2019-06-22 DIAGNOSIS — F0390 Unspecified dementia without behavioral disturbance: Secondary | ICD-10-CM | POA: Diagnosis not present

## 2019-06-22 DIAGNOSIS — I5033 Acute on chronic diastolic (congestive) heart failure: Secondary | ICD-10-CM | POA: Diagnosis not present

## 2019-06-22 DIAGNOSIS — J449 Chronic obstructive pulmonary disease, unspecified: Secondary | ICD-10-CM | POA: Diagnosis not present

## 2019-06-22 DIAGNOSIS — M1712 Unilateral primary osteoarthritis, left knee: Secondary | ICD-10-CM | POA: Diagnosis not present

## 2019-06-22 DIAGNOSIS — I2510 Atherosclerotic heart disease of native coronary artery without angina pectoris: Secondary | ICD-10-CM | POA: Diagnosis not present

## 2019-06-22 DIAGNOSIS — I6523 Occlusion and stenosis of bilateral carotid arteries: Secondary | ICD-10-CM | POA: Diagnosis not present

## 2019-06-23 DIAGNOSIS — I10 Essential (primary) hypertension: Secondary | ICD-10-CM | POA: Diagnosis not present

## 2019-06-23 DIAGNOSIS — E039 Hypothyroidism, unspecified: Secondary | ICD-10-CM | POA: Diagnosis not present

## 2019-06-23 DIAGNOSIS — Z299 Encounter for prophylactic measures, unspecified: Secondary | ICD-10-CM | POA: Diagnosis not present

## 2019-06-23 DIAGNOSIS — Z6830 Body mass index (BMI) 30.0-30.9, adult: Secondary | ICD-10-CM | POA: Diagnosis not present

## 2019-06-23 DIAGNOSIS — M1711 Unilateral primary osteoarthritis, right knee: Secondary | ICD-10-CM | POA: Diagnosis not present

## 2019-06-23 DIAGNOSIS — I2510 Atherosclerotic heart disease of native coronary artery without angina pectoris: Secondary | ICD-10-CM | POA: Diagnosis not present

## 2019-06-24 DIAGNOSIS — G609 Hereditary and idiopathic neuropathy, unspecified: Secondary | ICD-10-CM | POA: Diagnosis not present

## 2019-06-24 DIAGNOSIS — I2510 Atherosclerotic heart disease of native coronary artery without angina pectoris: Secondary | ICD-10-CM | POA: Diagnosis not present

## 2019-06-24 DIAGNOSIS — Z85828 Personal history of other malignant neoplasm of skin: Secondary | ICD-10-CM | POA: Diagnosis not present

## 2019-06-24 DIAGNOSIS — Z9981 Dependence on supplemental oxygen: Secondary | ICD-10-CM | POA: Diagnosis not present

## 2019-06-24 DIAGNOSIS — F0390 Unspecified dementia without behavioral disturbance: Secondary | ICD-10-CM | POA: Diagnosis not present

## 2019-06-24 DIAGNOSIS — I5033 Acute on chronic diastolic (congestive) heart failure: Secondary | ICD-10-CM | POA: Diagnosis not present

## 2019-06-24 DIAGNOSIS — R2689 Other abnormalities of gait and mobility: Secondary | ICD-10-CM | POA: Diagnosis not present

## 2019-06-24 DIAGNOSIS — M1712 Unilateral primary osteoarthritis, left knee: Secondary | ICD-10-CM | POA: Diagnosis not present

## 2019-06-24 DIAGNOSIS — I6523 Occlusion and stenosis of bilateral carotid arteries: Secondary | ICD-10-CM | POA: Diagnosis not present

## 2019-06-24 DIAGNOSIS — Z87820 Personal history of traumatic brain injury: Secondary | ICD-10-CM | POA: Diagnosis not present

## 2019-06-24 DIAGNOSIS — J449 Chronic obstructive pulmonary disease, unspecified: Secondary | ICD-10-CM | POA: Diagnosis not present

## 2019-06-25 DIAGNOSIS — M1712 Unilateral primary osteoarthritis, left knee: Secondary | ICD-10-CM | POA: Diagnosis not present

## 2019-06-25 DIAGNOSIS — Z6830 Body mass index (BMI) 30.0-30.9, adult: Secondary | ICD-10-CM | POA: Diagnosis not present

## 2019-06-25 DIAGNOSIS — M1710 Unilateral primary osteoarthritis, unspecified knee: Secondary | ICD-10-CM | POA: Diagnosis not present

## 2019-06-25 DIAGNOSIS — Z299 Encounter for prophylactic measures, unspecified: Secondary | ICD-10-CM | POA: Diagnosis not present

## 2019-06-25 DIAGNOSIS — I10 Essential (primary) hypertension: Secondary | ICD-10-CM | POA: Diagnosis not present

## 2019-06-29 DIAGNOSIS — M1711 Unilateral primary osteoarthritis, right knee: Secondary | ICD-10-CM | POA: Diagnosis not present

## 2019-06-29 DIAGNOSIS — Z299 Encounter for prophylactic measures, unspecified: Secondary | ICD-10-CM | POA: Diagnosis not present

## 2019-06-29 DIAGNOSIS — I6523 Occlusion and stenosis of bilateral carotid arteries: Secondary | ICD-10-CM | POA: Diagnosis not present

## 2019-06-29 DIAGNOSIS — Z6830 Body mass index (BMI) 30.0-30.9, adult: Secondary | ICD-10-CM | POA: Diagnosis not present

## 2019-06-29 DIAGNOSIS — J449 Chronic obstructive pulmonary disease, unspecified: Secondary | ICD-10-CM | POA: Diagnosis not present

## 2019-06-29 DIAGNOSIS — I5033 Acute on chronic diastolic (congestive) heart failure: Secondary | ICD-10-CM | POA: Diagnosis not present

## 2019-06-29 DIAGNOSIS — I10 Essential (primary) hypertension: Secondary | ICD-10-CM | POA: Diagnosis not present

## 2019-06-29 DIAGNOSIS — F0390 Unspecified dementia without behavioral disturbance: Secondary | ICD-10-CM | POA: Diagnosis not present

## 2019-06-29 DIAGNOSIS — M1712 Unilateral primary osteoarthritis, left knee: Secondary | ICD-10-CM | POA: Diagnosis not present

## 2019-06-29 DIAGNOSIS — I2510 Atherosclerotic heart disease of native coronary artery without angina pectoris: Secondary | ICD-10-CM | POA: Diagnosis not present

## 2019-07-03 DIAGNOSIS — Z299 Encounter for prophylactic measures, unspecified: Secondary | ICD-10-CM | POA: Diagnosis not present

## 2019-07-03 DIAGNOSIS — M1712 Unilateral primary osteoarthritis, left knee: Secondary | ICD-10-CM | POA: Diagnosis not present

## 2019-07-03 DIAGNOSIS — I10 Essential (primary) hypertension: Secondary | ICD-10-CM | POA: Diagnosis not present

## 2019-07-03 DIAGNOSIS — E039 Hypothyroidism, unspecified: Secondary | ICD-10-CM | POA: Diagnosis not present

## 2019-07-03 DIAGNOSIS — Z6830 Body mass index (BMI) 30.0-30.9, adult: Secondary | ICD-10-CM | POA: Diagnosis not present

## 2019-07-03 DIAGNOSIS — M1710 Unilateral primary osteoarthritis, unspecified knee: Secondary | ICD-10-CM | POA: Diagnosis not present

## 2019-07-06 DIAGNOSIS — I2510 Atherosclerotic heart disease of native coronary artery without angina pectoris: Secondary | ICD-10-CM | POA: Diagnosis not present

## 2019-07-06 DIAGNOSIS — I959 Hypotension, unspecified: Secondary | ICD-10-CM | POA: Diagnosis not present

## 2019-07-06 DIAGNOSIS — M1710 Unilateral primary osteoarthritis, unspecified knee: Secondary | ICD-10-CM | POA: Diagnosis not present

## 2019-07-06 DIAGNOSIS — Z299 Encounter for prophylactic measures, unspecified: Secondary | ICD-10-CM | POA: Diagnosis not present

## 2019-07-06 DIAGNOSIS — F0390 Unspecified dementia without behavioral disturbance: Secondary | ICD-10-CM | POA: Diagnosis not present

## 2019-07-06 DIAGNOSIS — M1711 Unilateral primary osteoarthritis, right knee: Secondary | ICD-10-CM | POA: Diagnosis not present

## 2019-07-06 DIAGNOSIS — I10 Essential (primary) hypertension: Secondary | ICD-10-CM | POA: Diagnosis not present

## 2019-07-06 DIAGNOSIS — J449 Chronic obstructive pulmonary disease, unspecified: Secondary | ICD-10-CM | POA: Diagnosis not present

## 2019-07-06 DIAGNOSIS — Z6830 Body mass index (BMI) 30.0-30.9, adult: Secondary | ICD-10-CM | POA: Diagnosis not present

## 2019-07-06 DIAGNOSIS — I5033 Acute on chronic diastolic (congestive) heart failure: Secondary | ICD-10-CM | POA: Diagnosis not present

## 2019-07-06 DIAGNOSIS — I6523 Occlusion and stenosis of bilateral carotid arteries: Secondary | ICD-10-CM | POA: Diagnosis not present

## 2019-07-06 DIAGNOSIS — M1712 Unilateral primary osteoarthritis, left knee: Secondary | ICD-10-CM | POA: Diagnosis not present

## 2019-07-13 DIAGNOSIS — Z6830 Body mass index (BMI) 30.0-30.9, adult: Secondary | ICD-10-CM | POA: Diagnosis not present

## 2019-07-13 DIAGNOSIS — I2510 Atherosclerotic heart disease of native coronary artery without angina pectoris: Secondary | ICD-10-CM | POA: Diagnosis not present

## 2019-07-13 DIAGNOSIS — M1710 Unilateral primary osteoarthritis, unspecified knee: Secondary | ICD-10-CM | POA: Diagnosis not present

## 2019-07-13 DIAGNOSIS — I6523 Occlusion and stenosis of bilateral carotid arteries: Secondary | ICD-10-CM | POA: Diagnosis not present

## 2019-07-13 DIAGNOSIS — I5033 Acute on chronic diastolic (congestive) heart failure: Secondary | ICD-10-CM | POA: Diagnosis not present

## 2019-07-13 DIAGNOSIS — I10 Essential (primary) hypertension: Secondary | ICD-10-CM | POA: Diagnosis not present

## 2019-07-13 DIAGNOSIS — M1711 Unilateral primary osteoarthritis, right knee: Secondary | ICD-10-CM | POA: Diagnosis not present

## 2019-07-13 DIAGNOSIS — Z299 Encounter for prophylactic measures, unspecified: Secondary | ICD-10-CM | POA: Diagnosis not present

## 2019-07-13 DIAGNOSIS — M1712 Unilateral primary osteoarthritis, left knee: Secondary | ICD-10-CM | POA: Diagnosis not present

## 2019-07-13 DIAGNOSIS — J449 Chronic obstructive pulmonary disease, unspecified: Secondary | ICD-10-CM | POA: Diagnosis not present

## 2019-07-13 DIAGNOSIS — F0390 Unspecified dementia without behavioral disturbance: Secondary | ICD-10-CM | POA: Diagnosis not present

## 2019-07-17 DIAGNOSIS — Z6830 Body mass index (BMI) 30.0-30.9, adult: Secondary | ICD-10-CM | POA: Diagnosis not present

## 2019-07-17 DIAGNOSIS — M1712 Unilateral primary osteoarthritis, left knee: Secondary | ICD-10-CM | POA: Diagnosis not present

## 2019-07-17 DIAGNOSIS — E039 Hypothyroidism, unspecified: Secondary | ICD-10-CM | POA: Diagnosis not present

## 2019-07-17 DIAGNOSIS — I10 Essential (primary) hypertension: Secondary | ICD-10-CM | POA: Diagnosis not present

## 2019-07-17 DIAGNOSIS — Z299 Encounter for prophylactic measures, unspecified: Secondary | ICD-10-CM | POA: Diagnosis not present

## 2019-07-18 IMAGING — CR PORTABLE CHEST - 1 VIEW
1 series · 2 of 2 positions shown · non-contrast
Comparison: September 11, 2018

CLINICAL DATA: Increasing swelling.  Cough.

EXAM:
PORTABLE CHEST 1 VIEW

[Series 1: portable · 0.17mm/px · 2 of 2 slices shown]
[im 1/2]
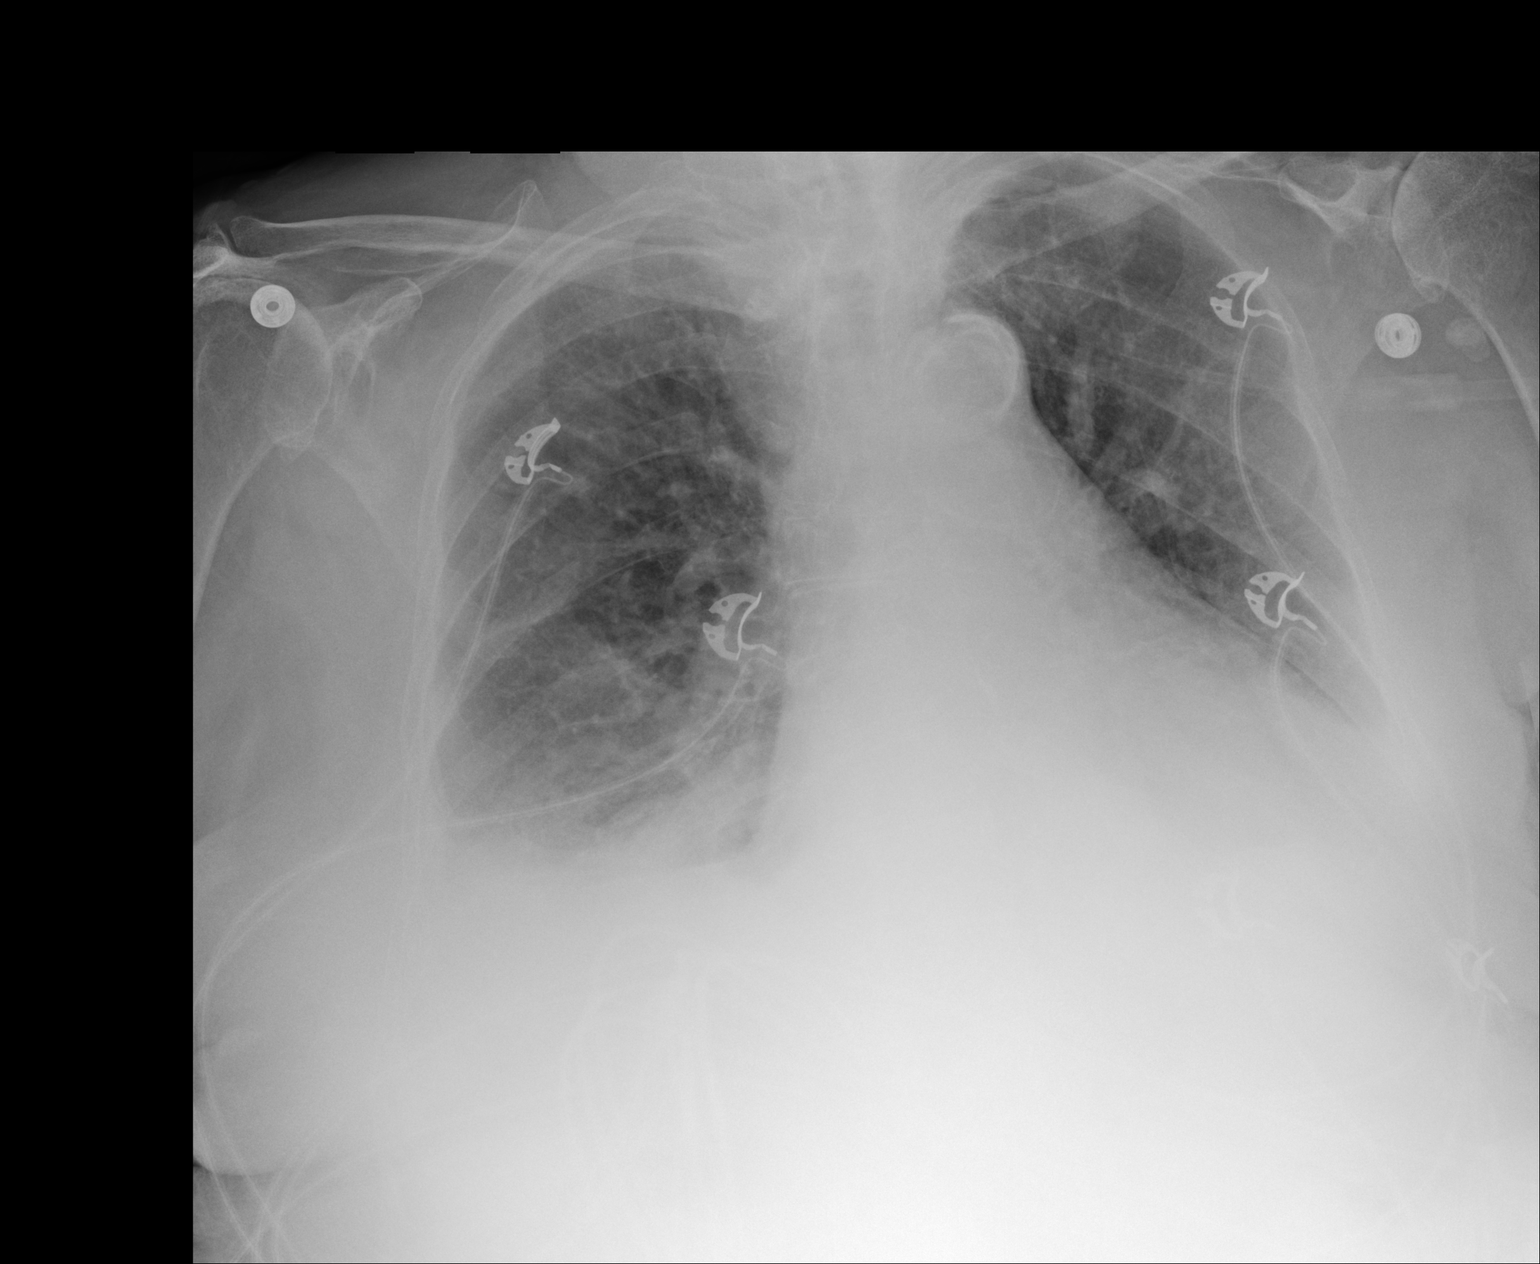
[im 2/2]
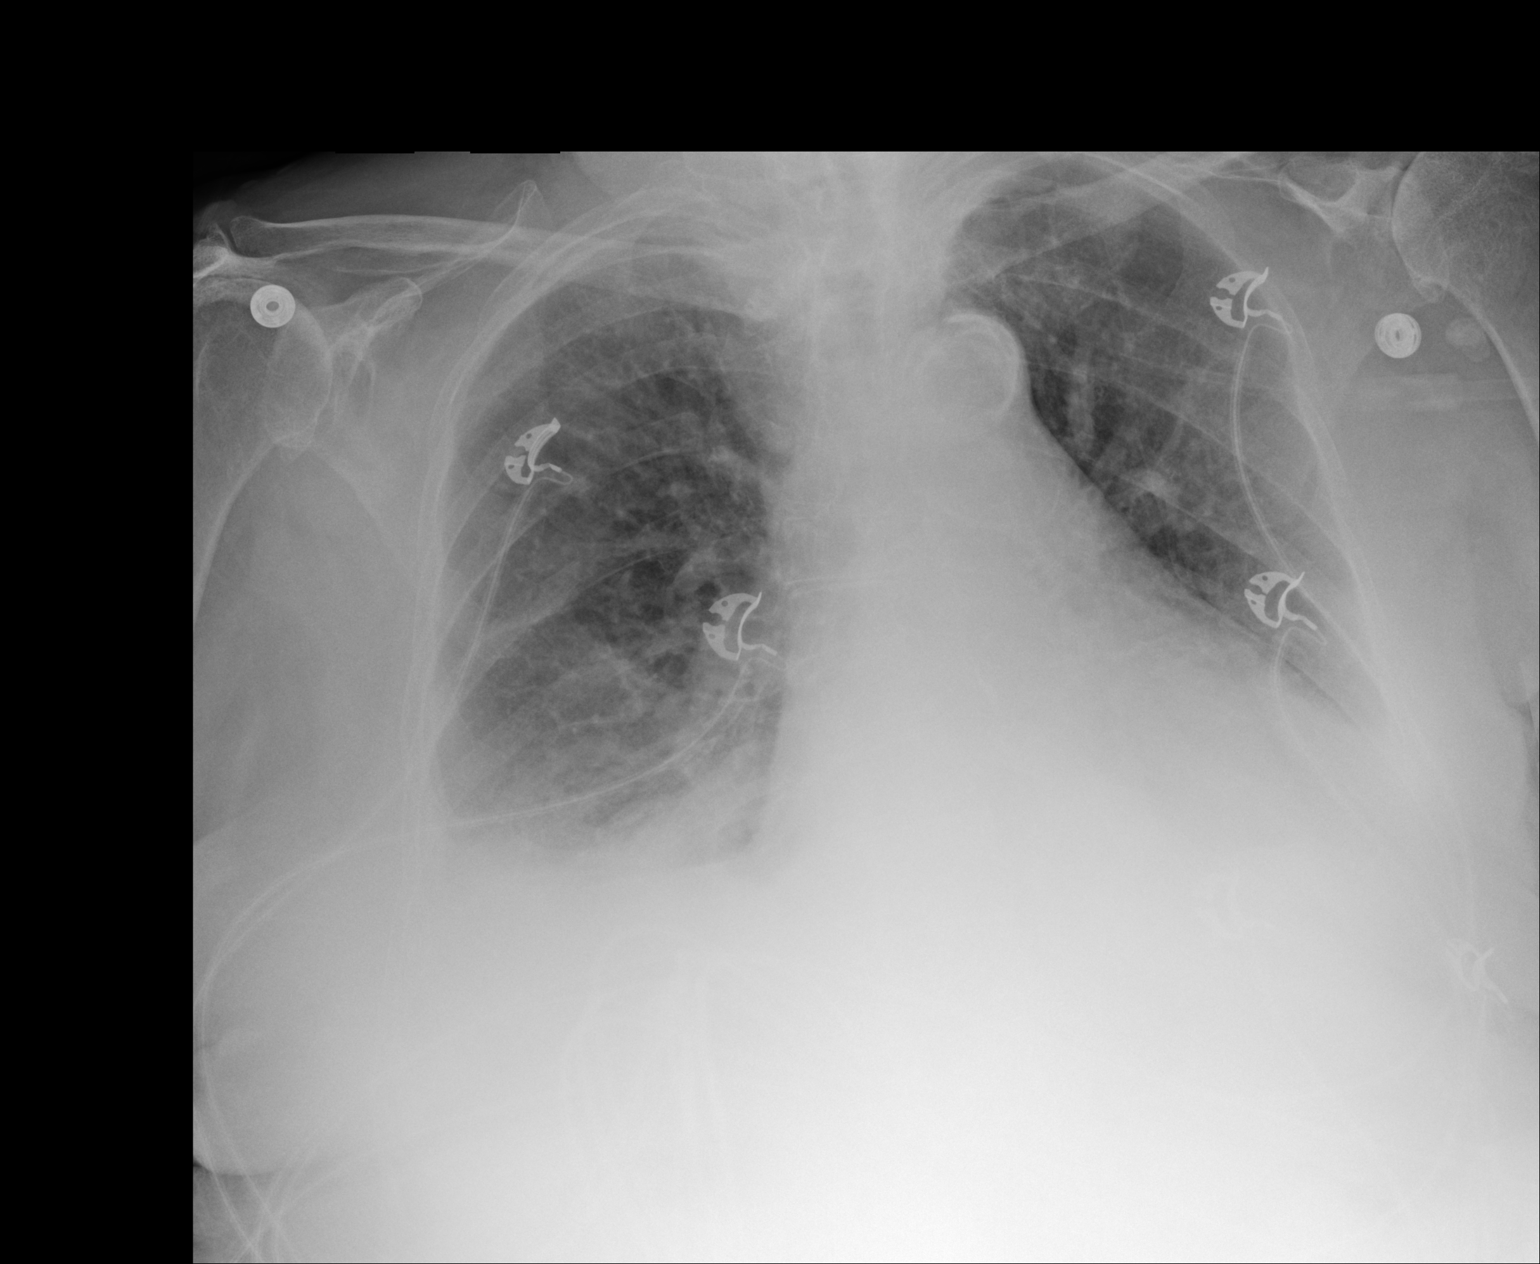

[2 of 2 positions shown; findings below may reference images not displayed]

FINDINGS: Stable cardiomegaly. The hila and mediastinum are unremarkable.
Bilateral pleural effusions with underlying atelectasis. Mild
pulmonary venous congestion.
IMPRESSION: 1. Bilateral pleural effusions with underlying opacities, likely
atelectasis. Pulmonary venous congestion.

## 2019-07-20 DIAGNOSIS — I10 Essential (primary) hypertension: Secondary | ICD-10-CM | POA: Diagnosis not present

## 2019-07-20 DIAGNOSIS — F0390 Unspecified dementia without behavioral disturbance: Secondary | ICD-10-CM | POA: Diagnosis not present

## 2019-07-20 DIAGNOSIS — Z299 Encounter for prophylactic measures, unspecified: Secondary | ICD-10-CM | POA: Diagnosis not present

## 2019-07-20 DIAGNOSIS — M1712 Unilateral primary osteoarthritis, left knee: Secondary | ICD-10-CM | POA: Diagnosis not present

## 2019-07-20 DIAGNOSIS — M1711 Unilateral primary osteoarthritis, right knee: Secondary | ICD-10-CM | POA: Diagnosis not present

## 2019-07-20 DIAGNOSIS — I5033 Acute on chronic diastolic (congestive) heart failure: Secondary | ICD-10-CM | POA: Diagnosis not present

## 2019-07-20 DIAGNOSIS — I2510 Atherosclerotic heart disease of native coronary artery without angina pectoris: Secondary | ICD-10-CM | POA: Diagnosis not present

## 2019-07-20 DIAGNOSIS — Z6830 Body mass index (BMI) 30.0-30.9, adult: Secondary | ICD-10-CM | POA: Diagnosis not present

## 2019-07-20 DIAGNOSIS — I6523 Occlusion and stenosis of bilateral carotid arteries: Secondary | ICD-10-CM | POA: Diagnosis not present

## 2019-07-20 DIAGNOSIS — J449 Chronic obstructive pulmonary disease, unspecified: Secondary | ICD-10-CM | POA: Diagnosis not present

## 2019-07-20 DIAGNOSIS — M1710 Unilateral primary osteoarthritis, unspecified knee: Secondary | ICD-10-CM | POA: Diagnosis not present

## 2019-07-24 DIAGNOSIS — R2689 Other abnormalities of gait and mobility: Secondary | ICD-10-CM | POA: Diagnosis not present

## 2019-07-24 DIAGNOSIS — F0390 Unspecified dementia without behavioral disturbance: Secondary | ICD-10-CM | POA: Diagnosis not present

## 2019-07-24 DIAGNOSIS — G609 Hereditary and idiopathic neuropathy, unspecified: Secondary | ICD-10-CM | POA: Diagnosis not present

## 2019-07-24 DIAGNOSIS — Z6830 Body mass index (BMI) 30.0-30.9, adult: Secondary | ICD-10-CM | POA: Diagnosis not present

## 2019-07-24 DIAGNOSIS — M1712 Unilateral primary osteoarthritis, left knee: Secondary | ICD-10-CM | POA: Diagnosis not present

## 2019-07-24 DIAGNOSIS — J449 Chronic obstructive pulmonary disease, unspecified: Secondary | ICD-10-CM | POA: Diagnosis not present

## 2019-07-24 DIAGNOSIS — I5033 Acute on chronic diastolic (congestive) heart failure: Secondary | ICD-10-CM | POA: Diagnosis not present

## 2019-07-24 DIAGNOSIS — I639 Cerebral infarction, unspecified: Secondary | ICD-10-CM | POA: Diagnosis not present

## 2019-07-24 DIAGNOSIS — Z87820 Personal history of traumatic brain injury: Secondary | ICD-10-CM | POA: Diagnosis not present

## 2019-07-24 DIAGNOSIS — Z85828 Personal history of other malignant neoplasm of skin: Secondary | ICD-10-CM | POA: Diagnosis not present

## 2019-07-24 DIAGNOSIS — I2510 Atherosclerotic heart disease of native coronary artery without angina pectoris: Secondary | ICD-10-CM | POA: Diagnosis not present

## 2019-07-24 DIAGNOSIS — Z9981 Dependence on supplemental oxygen: Secondary | ICD-10-CM | POA: Diagnosis not present

## 2019-07-24 DIAGNOSIS — I10 Essential (primary) hypertension: Secondary | ICD-10-CM | POA: Diagnosis not present

## 2019-07-24 DIAGNOSIS — Z299 Encounter for prophylactic measures, unspecified: Secondary | ICD-10-CM | POA: Diagnosis not present

## 2019-07-24 DIAGNOSIS — I6523 Occlusion and stenosis of bilateral carotid arteries: Secondary | ICD-10-CM | POA: Diagnosis not present

## 2019-07-27 DIAGNOSIS — I2510 Atherosclerotic heart disease of native coronary artery without angina pectoris: Secondary | ICD-10-CM | POA: Diagnosis not present

## 2019-07-27 DIAGNOSIS — M1712 Unilateral primary osteoarthritis, left knee: Secondary | ICD-10-CM | POA: Diagnosis not present

## 2019-07-27 DIAGNOSIS — J449 Chronic obstructive pulmonary disease, unspecified: Secondary | ICD-10-CM | POA: Diagnosis not present

## 2019-07-27 DIAGNOSIS — I6523 Occlusion and stenosis of bilateral carotid arteries: Secondary | ICD-10-CM | POA: Diagnosis not present

## 2019-07-27 DIAGNOSIS — I5033 Acute on chronic diastolic (congestive) heart failure: Secondary | ICD-10-CM | POA: Diagnosis not present

## 2019-07-27 DIAGNOSIS — F0390 Unspecified dementia without behavioral disturbance: Secondary | ICD-10-CM | POA: Diagnosis not present

## 2019-07-28 DIAGNOSIS — M79675 Pain in left toe(s): Secondary | ICD-10-CM | POA: Diagnosis not present

## 2019-07-28 DIAGNOSIS — M79674 Pain in right toe(s): Secondary | ICD-10-CM | POA: Diagnosis not present

## 2019-07-28 DIAGNOSIS — B351 Tinea unguium: Secondary | ICD-10-CM | POA: Diagnosis not present

## 2019-07-30 DIAGNOSIS — Z20828 Contact with and (suspected) exposure to other viral communicable diseases: Secondary | ICD-10-CM | POA: Diagnosis not present

## 2019-07-30 DIAGNOSIS — U071 COVID-19: Secondary | ICD-10-CM | POA: Diagnosis not present

## 2019-08-04 DIAGNOSIS — U071 COVID-19: Secondary | ICD-10-CM | POA: Diagnosis not present

## 2019-08-04 DIAGNOSIS — F0390 Unspecified dementia without behavioral disturbance: Secondary | ICD-10-CM | POA: Diagnosis not present

## 2019-08-04 DIAGNOSIS — I5022 Chronic systolic (congestive) heart failure: Secondary | ICD-10-CM | POA: Diagnosis not present

## 2019-08-04 DIAGNOSIS — Z299 Encounter for prophylactic measures, unspecified: Secondary | ICD-10-CM | POA: Diagnosis not present

## 2019-08-04 DIAGNOSIS — I2721 Secondary pulmonary arterial hypertension: Secondary | ICD-10-CM | POA: Diagnosis not present

## 2019-08-04 DIAGNOSIS — Z20828 Contact with and (suspected) exposure to other viral communicable diseases: Secondary | ICD-10-CM | POA: Diagnosis not present

## 2019-08-04 DIAGNOSIS — I2510 Atherosclerotic heart disease of native coronary artery without angina pectoris: Secondary | ICD-10-CM | POA: Diagnosis not present

## 2019-08-04 DIAGNOSIS — Z6830 Body mass index (BMI) 30.0-30.9, adult: Secondary | ICD-10-CM | POA: Diagnosis not present

## 2019-08-07 IMAGING — CR PORTABLE CHEST - 1 VIEW
1 series · 2 of 2 positions shown · non-contrast
Comparison: 02/12/2019 and older exams.

CLINICAL DATA: Pt brought in by EMS from [REDACTED]. EMS unclear why
pt was sent. Staff stated that face swollen, hallucinations and leg
pain with edema. Pt reports she does not know why she here. Pt has
dementia

EXAM:
PORTABLE CHEST 1 VIEW

[Series 1: portable · 0.17mm/px · 2 of 2 slices shown]
[im 1/2]
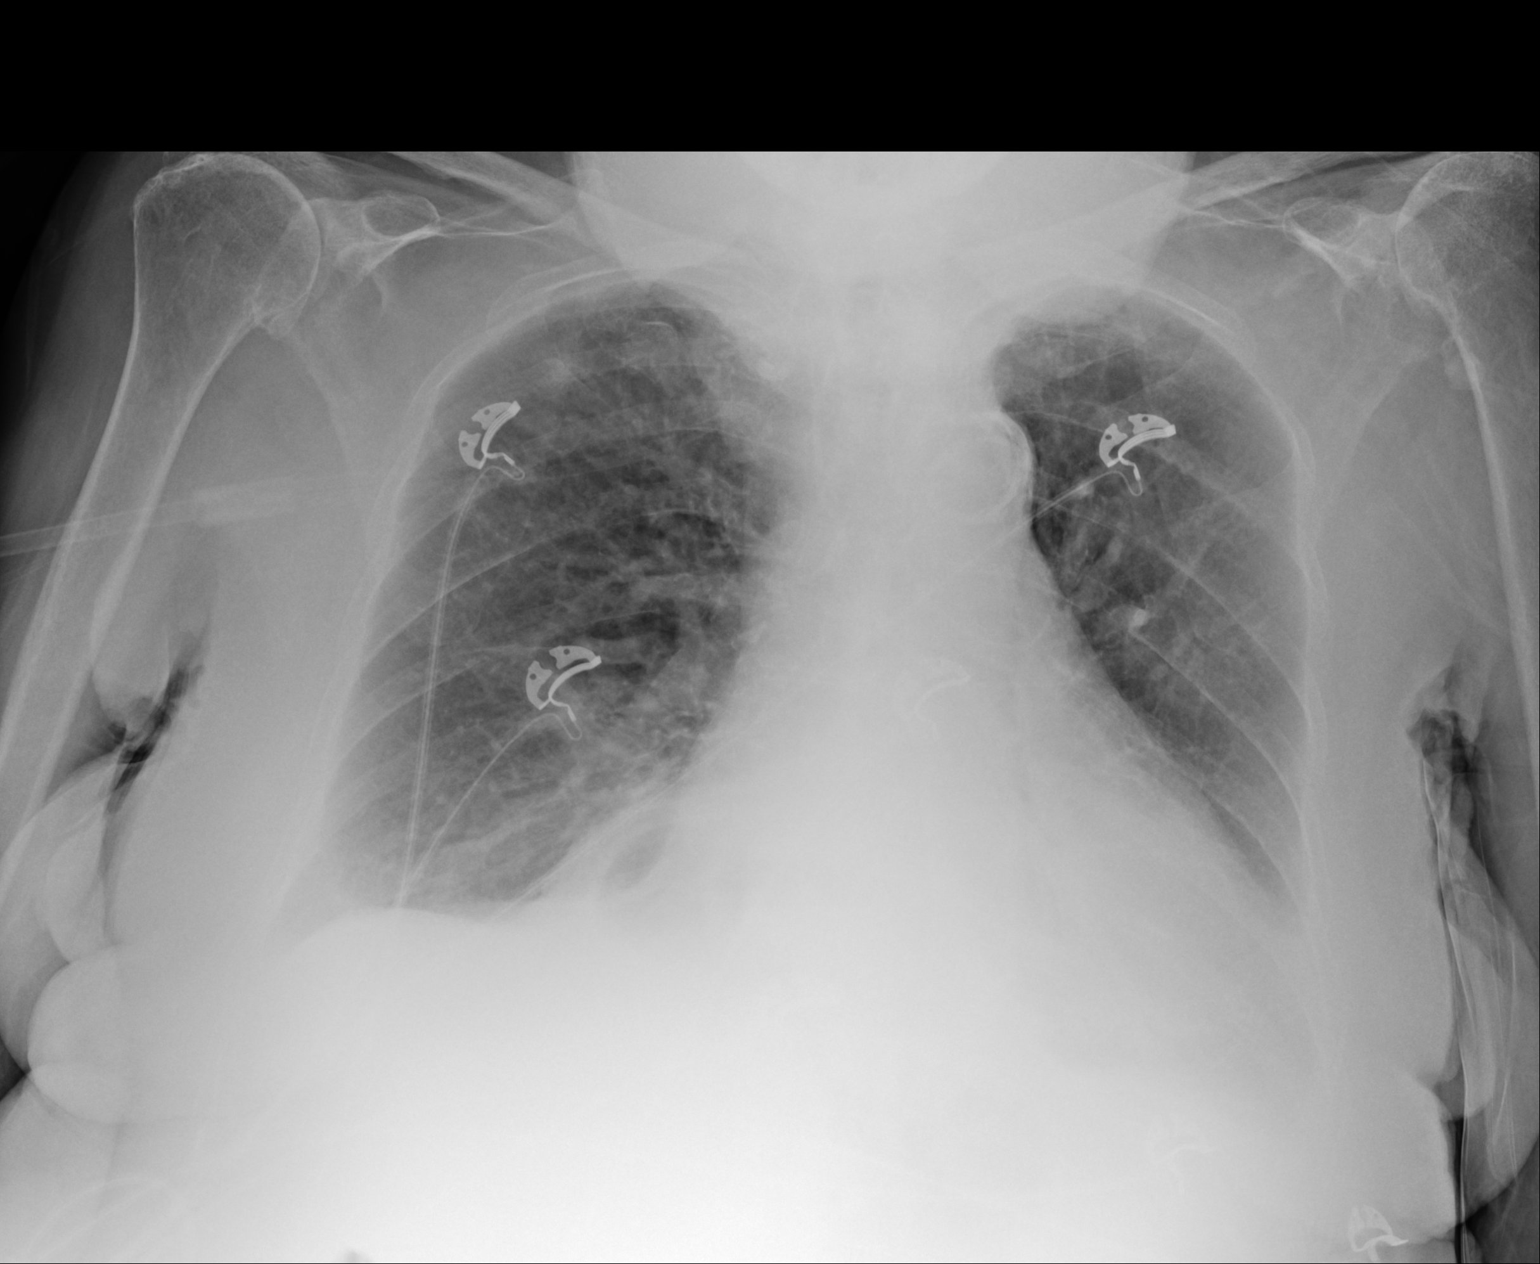
[im 2/2]
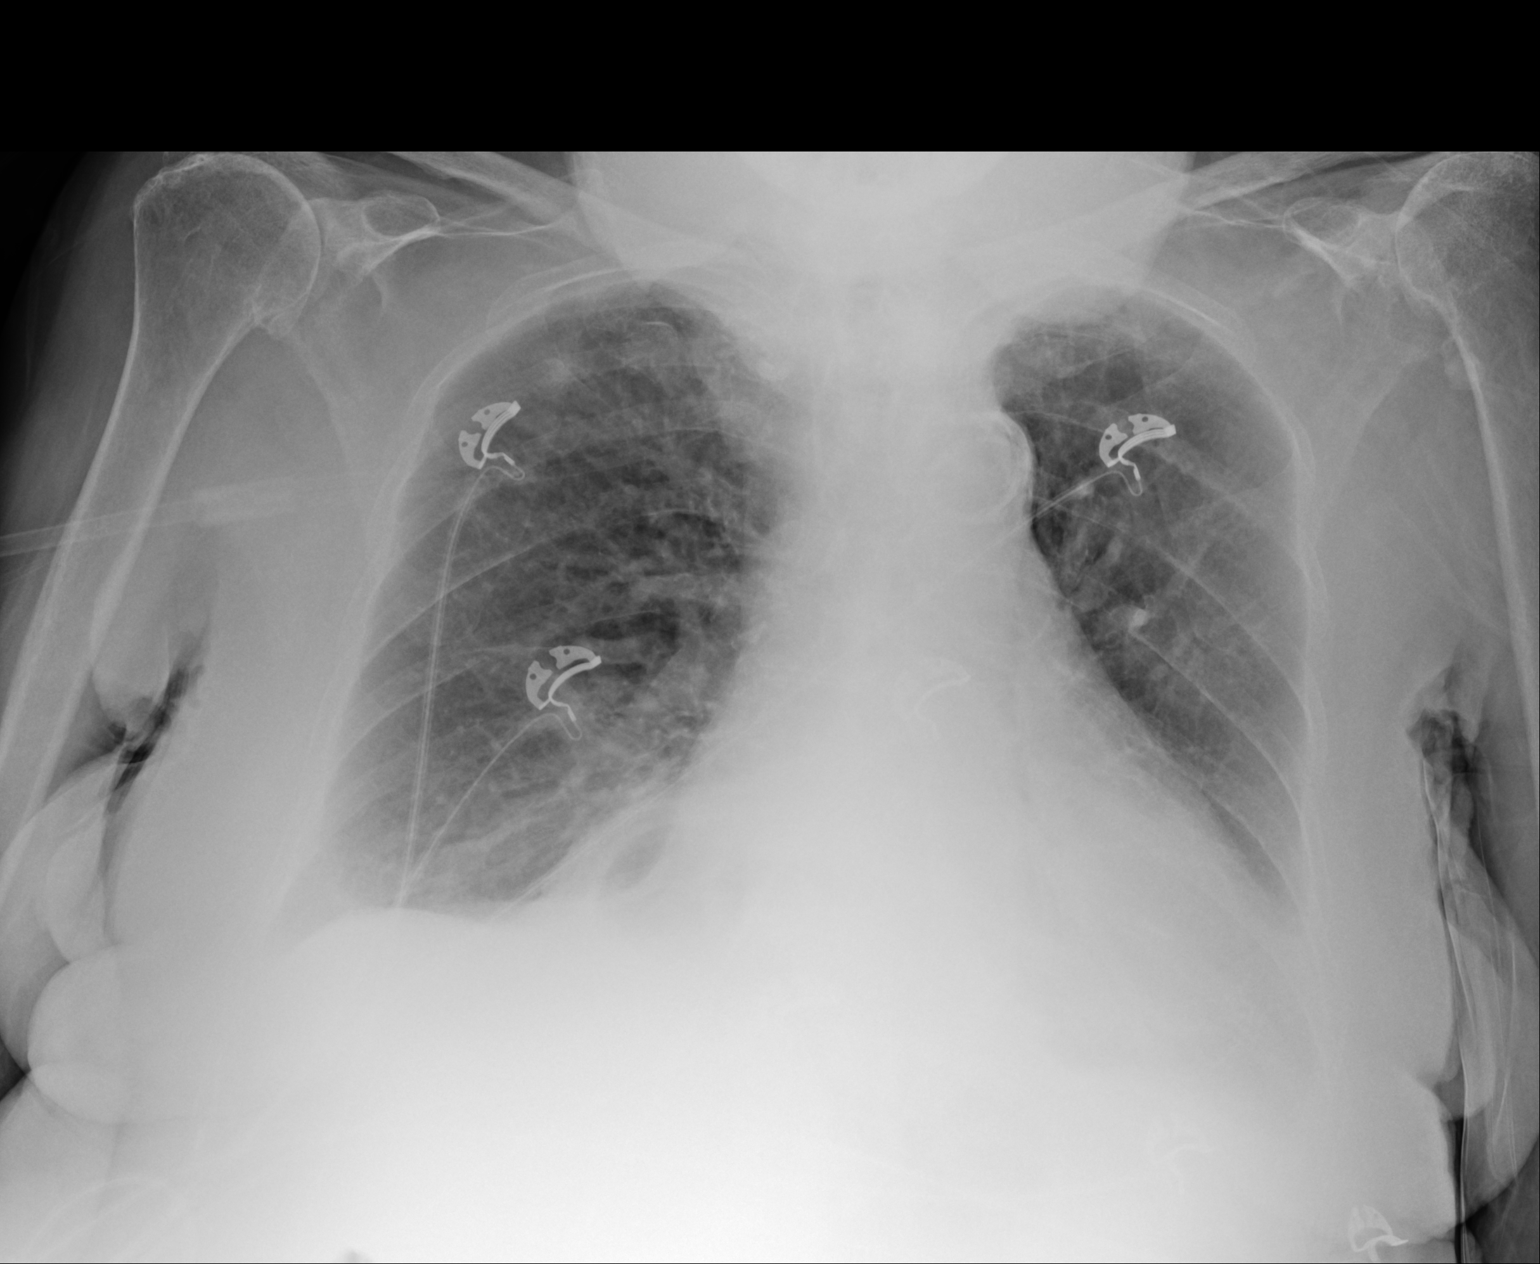

[2 of 2 positions shown; findings below may reference images not displayed]

FINDINGS: Cardiac silhouette is mildly enlarged. No mediastinal or hilar
masses.

Presumed hiatal hernia projects at the right costophrenic angle,
similar to prior exams.

There are prominent bilateral vascular markings with linear
opacities in the lung bases, the latter finding likely atelectasis.
This appearance is similar to prior studies.

Possible small effusions.  No pneumothorax.

Skeletal structures are demineralized but grossly intact.
IMPRESSION: 1. Chest radiograph appearance suggests a mild degree of congestive
heart failure if there is shortness of breath. Probable small
effusions. No convincing pneumonia.

## 2019-08-10 DIAGNOSIS — M1712 Unilateral primary osteoarthritis, left knee: Secondary | ICD-10-CM | POA: Diagnosis not present

## 2019-08-23 DIAGNOSIS — R2689 Other abnormalities of gait and mobility: Secondary | ICD-10-CM | POA: Diagnosis not present

## 2019-08-23 DIAGNOSIS — Z9981 Dependence on supplemental oxygen: Secondary | ICD-10-CM | POA: Diagnosis not present

## 2019-08-23 DIAGNOSIS — I6523 Occlusion and stenosis of bilateral carotid arteries: Secondary | ICD-10-CM | POA: Diagnosis not present

## 2019-08-23 DIAGNOSIS — J449 Chronic obstructive pulmonary disease, unspecified: Secondary | ICD-10-CM | POA: Diagnosis not present

## 2019-08-23 DIAGNOSIS — F0390 Unspecified dementia without behavioral disturbance: Secondary | ICD-10-CM | POA: Diagnosis not present

## 2019-08-23 DIAGNOSIS — M1712 Unilateral primary osteoarthritis, left knee: Secondary | ICD-10-CM | POA: Diagnosis not present

## 2019-08-23 DIAGNOSIS — G609 Hereditary and idiopathic neuropathy, unspecified: Secondary | ICD-10-CM | POA: Diagnosis not present

## 2019-08-23 DIAGNOSIS — I5033 Acute on chronic diastolic (congestive) heart failure: Secondary | ICD-10-CM | POA: Diagnosis not present

## 2019-08-23 DIAGNOSIS — I2510 Atherosclerotic heart disease of native coronary artery without angina pectoris: Secondary | ICD-10-CM | POA: Diagnosis not present

## 2019-08-23 DIAGNOSIS — Z87820 Personal history of traumatic brain injury: Secondary | ICD-10-CM | POA: Diagnosis not present

## 2019-08-23 DIAGNOSIS — Z85828 Personal history of other malignant neoplasm of skin: Secondary | ICD-10-CM | POA: Diagnosis not present

## 2019-08-25 DIAGNOSIS — M1712 Unilateral primary osteoarthritis, left knee: Secondary | ICD-10-CM | POA: Diagnosis not present

## 2019-08-25 DIAGNOSIS — J449 Chronic obstructive pulmonary disease, unspecified: Secondary | ICD-10-CM | POA: Diagnosis not present

## 2019-08-25 DIAGNOSIS — F0390 Unspecified dementia without behavioral disturbance: Secondary | ICD-10-CM | POA: Diagnosis not present

## 2019-08-25 DIAGNOSIS — I5033 Acute on chronic diastolic (congestive) heart failure: Secondary | ICD-10-CM | POA: Diagnosis not present

## 2019-08-25 DIAGNOSIS — I6523 Occlusion and stenosis of bilateral carotid arteries: Secondary | ICD-10-CM | POA: Diagnosis not present

## 2019-08-25 DIAGNOSIS — I2510 Atherosclerotic heart disease of native coronary artery without angina pectoris: Secondary | ICD-10-CM | POA: Diagnosis not present

## 2019-08-26 DIAGNOSIS — I5033 Acute on chronic diastolic (congestive) heart failure: Secondary | ICD-10-CM | POA: Diagnosis not present

## 2019-08-26 DIAGNOSIS — I6523 Occlusion and stenosis of bilateral carotid arteries: Secondary | ICD-10-CM | POA: Diagnosis not present

## 2019-08-26 DIAGNOSIS — M1712 Unilateral primary osteoarthritis, left knee: Secondary | ICD-10-CM | POA: Diagnosis not present

## 2019-08-26 DIAGNOSIS — I2510 Atherosclerotic heart disease of native coronary artery without angina pectoris: Secondary | ICD-10-CM | POA: Diagnosis not present

## 2019-08-26 DIAGNOSIS — F0390 Unspecified dementia without behavioral disturbance: Secondary | ICD-10-CM | POA: Diagnosis not present

## 2019-08-26 DIAGNOSIS — J449 Chronic obstructive pulmonary disease, unspecified: Secondary | ICD-10-CM | POA: Diagnosis not present

## 2019-08-27 DIAGNOSIS — I2510 Atherosclerotic heart disease of native coronary artery without angina pectoris: Secondary | ICD-10-CM | POA: Diagnosis not present

## 2019-08-27 DIAGNOSIS — J449 Chronic obstructive pulmonary disease, unspecified: Secondary | ICD-10-CM | POA: Diagnosis not present

## 2019-08-27 DIAGNOSIS — I5033 Acute on chronic diastolic (congestive) heart failure: Secondary | ICD-10-CM | POA: Diagnosis not present

## 2019-08-27 DIAGNOSIS — M1712 Unilateral primary osteoarthritis, left knee: Secondary | ICD-10-CM | POA: Diagnosis not present

## 2019-08-31 DIAGNOSIS — Z20828 Contact with and (suspected) exposure to other viral communicable diseases: Secondary | ICD-10-CM | POA: Diagnosis not present

## 2019-08-31 DIAGNOSIS — I5033 Acute on chronic diastolic (congestive) heart failure: Secondary | ICD-10-CM | POA: Diagnosis not present

## 2019-08-31 DIAGNOSIS — U071 COVID-19: Secondary | ICD-10-CM | POA: Diagnosis not present

## 2019-08-31 DIAGNOSIS — J449 Chronic obstructive pulmonary disease, unspecified: Secondary | ICD-10-CM | POA: Diagnosis not present

## 2019-08-31 DIAGNOSIS — I6523 Occlusion and stenosis of bilateral carotid arteries: Secondary | ICD-10-CM | POA: Diagnosis not present

## 2019-08-31 DIAGNOSIS — F0390 Unspecified dementia without behavioral disturbance: Secondary | ICD-10-CM | POA: Diagnosis not present

## 2019-08-31 DIAGNOSIS — I2510 Atherosclerotic heart disease of native coronary artery without angina pectoris: Secondary | ICD-10-CM | POA: Diagnosis not present

## 2019-08-31 DIAGNOSIS — M1712 Unilateral primary osteoarthritis, left knee: Secondary | ICD-10-CM | POA: Diagnosis not present

## 2019-09-01 DIAGNOSIS — M1712 Unilateral primary osteoarthritis, left knee: Secondary | ICD-10-CM | POA: Diagnosis not present

## 2019-09-01 DIAGNOSIS — I6523 Occlusion and stenosis of bilateral carotid arteries: Secondary | ICD-10-CM | POA: Diagnosis not present

## 2019-09-01 DIAGNOSIS — F0390 Unspecified dementia without behavioral disturbance: Secondary | ICD-10-CM | POA: Diagnosis not present

## 2019-09-01 DIAGNOSIS — J449 Chronic obstructive pulmonary disease, unspecified: Secondary | ICD-10-CM | POA: Diagnosis not present

## 2019-09-01 DIAGNOSIS — I5033 Acute on chronic diastolic (congestive) heart failure: Secondary | ICD-10-CM | POA: Diagnosis not present

## 2019-09-01 DIAGNOSIS — I2510 Atherosclerotic heart disease of native coronary artery without angina pectoris: Secondary | ICD-10-CM | POA: Diagnosis not present

## 2019-09-07 DIAGNOSIS — U071 COVID-19: Secondary | ICD-10-CM | POA: Diagnosis not present

## 2019-09-07 DIAGNOSIS — I6523 Occlusion and stenosis of bilateral carotid arteries: Secondary | ICD-10-CM | POA: Diagnosis not present

## 2019-09-07 DIAGNOSIS — I5033 Acute on chronic diastolic (congestive) heart failure: Secondary | ICD-10-CM | POA: Diagnosis not present

## 2019-09-07 DIAGNOSIS — J449 Chronic obstructive pulmonary disease, unspecified: Secondary | ICD-10-CM | POA: Diagnosis not present

## 2019-09-07 DIAGNOSIS — F0390 Unspecified dementia without behavioral disturbance: Secondary | ICD-10-CM | POA: Diagnosis not present

## 2019-09-07 DIAGNOSIS — M1712 Unilateral primary osteoarthritis, left knee: Secondary | ICD-10-CM | POA: Diagnosis not present

## 2019-09-07 DIAGNOSIS — I2510 Atherosclerotic heart disease of native coronary artery without angina pectoris: Secondary | ICD-10-CM | POA: Diagnosis not present

## 2019-09-07 DIAGNOSIS — Z20828 Contact with and (suspected) exposure to other viral communicable diseases: Secondary | ICD-10-CM | POA: Diagnosis not present

## 2019-09-10 DIAGNOSIS — J449 Chronic obstructive pulmonary disease, unspecified: Secondary | ICD-10-CM | POA: Diagnosis not present

## 2019-09-10 DIAGNOSIS — I6523 Occlusion and stenosis of bilateral carotid arteries: Secondary | ICD-10-CM | POA: Diagnosis not present

## 2019-09-10 DIAGNOSIS — M1712 Unilateral primary osteoarthritis, left knee: Secondary | ICD-10-CM | POA: Diagnosis not present

## 2019-09-10 DIAGNOSIS — F0390 Unspecified dementia without behavioral disturbance: Secondary | ICD-10-CM | POA: Diagnosis not present

## 2019-09-10 DIAGNOSIS — I5033 Acute on chronic diastolic (congestive) heart failure: Secondary | ICD-10-CM | POA: Diagnosis not present

## 2019-09-10 DIAGNOSIS — I2510 Atherosclerotic heart disease of native coronary artery without angina pectoris: Secondary | ICD-10-CM | POA: Diagnosis not present

## 2019-09-14 DIAGNOSIS — Z20828 Contact with and (suspected) exposure to other viral communicable diseases: Secondary | ICD-10-CM | POA: Diagnosis not present

## 2019-09-14 DIAGNOSIS — U071 COVID-19: Secondary | ICD-10-CM | POA: Diagnosis not present

## 2019-09-15 DIAGNOSIS — J449 Chronic obstructive pulmonary disease, unspecified: Secondary | ICD-10-CM | POA: Diagnosis not present

## 2019-09-15 DIAGNOSIS — F0390 Unspecified dementia without behavioral disturbance: Secondary | ICD-10-CM | POA: Diagnosis not present

## 2019-09-15 DIAGNOSIS — I2510 Atherosclerotic heart disease of native coronary artery without angina pectoris: Secondary | ICD-10-CM | POA: Diagnosis not present

## 2019-09-15 DIAGNOSIS — M1712 Unilateral primary osteoarthritis, left knee: Secondary | ICD-10-CM | POA: Diagnosis not present

## 2019-09-15 DIAGNOSIS — I6523 Occlusion and stenosis of bilateral carotid arteries: Secondary | ICD-10-CM | POA: Diagnosis not present

## 2019-09-15 DIAGNOSIS — I5033 Acute on chronic diastolic (congestive) heart failure: Secondary | ICD-10-CM | POA: Diagnosis not present

## 2019-09-16 DIAGNOSIS — H353131 Nonexudative age-related macular degeneration, bilateral, early dry stage: Secondary | ICD-10-CM | POA: Diagnosis not present

## 2019-09-17 DIAGNOSIS — I5033 Acute on chronic diastolic (congestive) heart failure: Secondary | ICD-10-CM | POA: Diagnosis not present

## 2019-09-17 DIAGNOSIS — F0390 Unspecified dementia without behavioral disturbance: Secondary | ICD-10-CM | POA: Diagnosis not present

## 2019-09-17 DIAGNOSIS — J449 Chronic obstructive pulmonary disease, unspecified: Secondary | ICD-10-CM | POA: Diagnosis not present

## 2019-09-17 DIAGNOSIS — M1712 Unilateral primary osteoarthritis, left knee: Secondary | ICD-10-CM | POA: Diagnosis not present

## 2019-09-17 DIAGNOSIS — I6523 Occlusion and stenosis of bilateral carotid arteries: Secondary | ICD-10-CM | POA: Diagnosis not present

## 2019-09-17 DIAGNOSIS — I2510 Atherosclerotic heart disease of native coronary artery without angina pectoris: Secondary | ICD-10-CM | POA: Diagnosis not present

## 2019-09-21 DIAGNOSIS — U071 COVID-19: Secondary | ICD-10-CM | POA: Diagnosis not present

## 2019-09-21 DIAGNOSIS — Z20828 Contact with and (suspected) exposure to other viral communicable diseases: Secondary | ICD-10-CM | POA: Diagnosis not present

## 2019-09-22 DIAGNOSIS — Z9981 Dependence on supplemental oxygen: Secondary | ICD-10-CM | POA: Diagnosis not present

## 2019-09-22 DIAGNOSIS — F0390 Unspecified dementia without behavioral disturbance: Secondary | ICD-10-CM | POA: Diagnosis not present

## 2019-09-22 DIAGNOSIS — Z87820 Personal history of traumatic brain injury: Secondary | ICD-10-CM | POA: Diagnosis not present

## 2019-09-22 DIAGNOSIS — R2689 Other abnormalities of gait and mobility: Secondary | ICD-10-CM | POA: Diagnosis not present

## 2019-09-22 DIAGNOSIS — Z85828 Personal history of other malignant neoplasm of skin: Secondary | ICD-10-CM | POA: Diagnosis not present

## 2019-09-22 DIAGNOSIS — I5033 Acute on chronic diastolic (congestive) heart failure: Secondary | ICD-10-CM | POA: Diagnosis not present

## 2019-09-22 DIAGNOSIS — I6523 Occlusion and stenosis of bilateral carotid arteries: Secondary | ICD-10-CM | POA: Diagnosis not present

## 2019-09-22 DIAGNOSIS — M1712 Unilateral primary osteoarthritis, left knee: Secondary | ICD-10-CM | POA: Diagnosis not present

## 2019-09-22 DIAGNOSIS — G609 Hereditary and idiopathic neuropathy, unspecified: Secondary | ICD-10-CM | POA: Diagnosis not present

## 2019-09-22 DIAGNOSIS — I2510 Atherosclerotic heart disease of native coronary artery without angina pectoris: Secondary | ICD-10-CM | POA: Diagnosis not present

## 2019-09-22 DIAGNOSIS — J449 Chronic obstructive pulmonary disease, unspecified: Secondary | ICD-10-CM | POA: Diagnosis not present

## 2019-09-24 DIAGNOSIS — F0390 Unspecified dementia without behavioral disturbance: Secondary | ICD-10-CM | POA: Diagnosis not present

## 2019-09-24 DIAGNOSIS — M1712 Unilateral primary osteoarthritis, left knee: Secondary | ICD-10-CM | POA: Diagnosis not present

## 2019-09-24 DIAGNOSIS — I6523 Occlusion and stenosis of bilateral carotid arteries: Secondary | ICD-10-CM | POA: Diagnosis not present

## 2019-09-24 DIAGNOSIS — I2510 Atherosclerotic heart disease of native coronary artery without angina pectoris: Secondary | ICD-10-CM | POA: Diagnosis not present

## 2019-09-24 DIAGNOSIS — J449 Chronic obstructive pulmonary disease, unspecified: Secondary | ICD-10-CM | POA: Diagnosis not present

## 2019-09-24 DIAGNOSIS — I5033 Acute on chronic diastolic (congestive) heart failure: Secondary | ICD-10-CM | POA: Diagnosis not present

## 2019-09-28 DIAGNOSIS — U071 COVID-19: Secondary | ICD-10-CM | POA: Diagnosis not present

## 2019-09-28 DIAGNOSIS — Z20828 Contact with and (suspected) exposure to other viral communicable diseases: Secondary | ICD-10-CM | POA: Diagnosis not present

## 2019-09-28 DIAGNOSIS — Z23 Encounter for immunization: Secondary | ICD-10-CM | POA: Diagnosis not present

## 2019-09-29 DIAGNOSIS — F0390 Unspecified dementia without behavioral disturbance: Secondary | ICD-10-CM | POA: Diagnosis not present

## 2019-09-29 DIAGNOSIS — M1712 Unilateral primary osteoarthritis, left knee: Secondary | ICD-10-CM | POA: Diagnosis not present

## 2019-09-29 DIAGNOSIS — I2510 Atherosclerotic heart disease of native coronary artery without angina pectoris: Secondary | ICD-10-CM | POA: Diagnosis not present

## 2019-09-29 DIAGNOSIS — I5033 Acute on chronic diastolic (congestive) heart failure: Secondary | ICD-10-CM | POA: Diagnosis not present

## 2019-09-29 DIAGNOSIS — J449 Chronic obstructive pulmonary disease, unspecified: Secondary | ICD-10-CM | POA: Diagnosis not present

## 2019-09-29 DIAGNOSIS — I6523 Occlusion and stenosis of bilateral carotid arteries: Secondary | ICD-10-CM | POA: Diagnosis not present

## 2019-10-01 DIAGNOSIS — I6523 Occlusion and stenosis of bilateral carotid arteries: Secondary | ICD-10-CM | POA: Diagnosis not present

## 2019-10-01 DIAGNOSIS — I5033 Acute on chronic diastolic (congestive) heart failure: Secondary | ICD-10-CM | POA: Diagnosis not present

## 2019-10-01 DIAGNOSIS — I2510 Atherosclerotic heart disease of native coronary artery without angina pectoris: Secondary | ICD-10-CM | POA: Diagnosis not present

## 2019-10-01 DIAGNOSIS — J449 Chronic obstructive pulmonary disease, unspecified: Secondary | ICD-10-CM | POA: Diagnosis not present

## 2019-10-01 DIAGNOSIS — M1712 Unilateral primary osteoarthritis, left knee: Secondary | ICD-10-CM | POA: Diagnosis not present

## 2019-10-01 DIAGNOSIS — F0390 Unspecified dementia without behavioral disturbance: Secondary | ICD-10-CM | POA: Diagnosis not present

## 2019-10-05 DIAGNOSIS — Z20828 Contact with and (suspected) exposure to other viral communicable diseases: Secondary | ICD-10-CM | POA: Diagnosis not present

## 2019-10-05 DIAGNOSIS — U071 COVID-19: Secondary | ICD-10-CM | POA: Diagnosis not present

## 2019-10-08 DIAGNOSIS — J9611 Chronic respiratory failure with hypoxia: Secondary | ICD-10-CM | POA: Diagnosis not present

## 2019-10-08 DIAGNOSIS — Z6830 Body mass index (BMI) 30.0-30.9, adult: Secondary | ICD-10-CM | POA: Diagnosis not present

## 2019-10-08 DIAGNOSIS — M1710 Unilateral primary osteoarthritis, unspecified knee: Secondary | ICD-10-CM | POA: Diagnosis not present

## 2019-10-08 DIAGNOSIS — Z299 Encounter for prophylactic measures, unspecified: Secondary | ICD-10-CM | POA: Diagnosis not present

## 2019-10-08 DIAGNOSIS — N390 Urinary tract infection, site not specified: Secondary | ICD-10-CM | POA: Diagnosis not present

## 2019-10-12 DIAGNOSIS — M6281 Muscle weakness (generalized): Secondary | ICD-10-CM | POA: Diagnosis not present

## 2019-10-12 DIAGNOSIS — Z20828 Contact with and (suspected) exposure to other viral communicable diseases: Secondary | ICD-10-CM | POA: Diagnosis not present

## 2019-10-12 DIAGNOSIS — U071 COVID-19: Secondary | ICD-10-CM | POA: Diagnosis not present

## 2019-10-12 DIAGNOSIS — R278 Other lack of coordination: Secondary | ICD-10-CM | POA: Diagnosis not present

## 2019-10-13 DIAGNOSIS — U071 COVID-19: Secondary | ICD-10-CM | POA: Diagnosis not present

## 2019-10-14 DIAGNOSIS — M79674 Pain in right toe(s): Secondary | ICD-10-CM | POA: Diagnosis not present

## 2019-10-14 DIAGNOSIS — I10 Essential (primary) hypertension: Secondary | ICD-10-CM | POA: Diagnosis not present

## 2019-10-14 DIAGNOSIS — G4700 Insomnia, unspecified: Secondary | ICD-10-CM | POA: Diagnosis not present

## 2019-10-14 DIAGNOSIS — D692 Other nonthrombocytopenic purpura: Secondary | ICD-10-CM | POA: Diagnosis not present

## 2019-10-14 DIAGNOSIS — I2510 Atherosclerotic heart disease of native coronary artery without angina pectoris: Secondary | ICD-10-CM | POA: Diagnosis not present

## 2019-10-14 DIAGNOSIS — Z6830 Body mass index (BMI) 30.0-30.9, adult: Secondary | ICD-10-CM | POA: Diagnosis not present

## 2019-10-14 DIAGNOSIS — B351 Tinea unguium: Secondary | ICD-10-CM | POA: Diagnosis not present

## 2019-10-14 DIAGNOSIS — Z299 Encounter for prophylactic measures, unspecified: Secondary | ICD-10-CM | POA: Diagnosis not present

## 2019-10-14 DIAGNOSIS — M79675 Pain in left toe(s): Secondary | ICD-10-CM | POA: Diagnosis not present

## 2019-10-14 DIAGNOSIS — J9611 Chronic respiratory failure with hypoxia: Secondary | ICD-10-CM | POA: Diagnosis not present

## 2019-10-15 ENCOUNTER — Emergency Department (HOSPITAL_COMMUNITY): Payer: Medicare Other

## 2019-10-15 ENCOUNTER — Other Ambulatory Visit: Payer: Self-pay

## 2019-10-15 ENCOUNTER — Inpatient Hospital Stay (HOSPITAL_COMMUNITY)
Admission: EM | Admit: 2019-10-15 | Discharge: 2019-10-20 | DRG: 064 | Disposition: A | Discharge status: Hospice | Payer: Medicare Other | Source: Skilled Nursing Facility | Attending: Family Medicine | Admitting: Family Medicine

## 2019-10-15 ENCOUNTER — Encounter (HOSPITAL_COMMUNITY): Payer: Self-pay | Admitting: Emergency Medicine

## 2019-10-15 DIAGNOSIS — R29711 NIHSS score 11: Secondary | ICD-10-CM | POA: Diagnosis present

## 2019-10-15 DIAGNOSIS — E039 Hypothyroidism, unspecified: Secondary | ICD-10-CM | POA: Diagnosis not present

## 2019-10-15 DIAGNOSIS — Z515 Encounter for palliative care: Secondary | ICD-10-CM | POA: Diagnosis not present

## 2019-10-15 DIAGNOSIS — G9341 Metabolic encephalopathy: Secondary | ICD-10-CM | POA: Diagnosis not present

## 2019-10-15 DIAGNOSIS — Z8673 Personal history of transient ischemic attack (TIA), and cerebral infarction without residual deficits: Secondary | ICD-10-CM | POA: Diagnosis not present

## 2019-10-15 DIAGNOSIS — R4189 Other symptoms and signs involving cognitive functions and awareness: Secondary | ICD-10-CM | POA: Diagnosis present

## 2019-10-15 DIAGNOSIS — E162 Hypoglycemia, unspecified: Secondary | ICD-10-CM | POA: Diagnosis present

## 2019-10-15 DIAGNOSIS — Z23 Encounter for immunization: Secondary | ICD-10-CM | POA: Diagnosis not present

## 2019-10-15 DIAGNOSIS — Z993 Dependence on wheelchair: Secondary | ICD-10-CM | POA: Diagnosis not present

## 2019-10-15 DIAGNOSIS — J9811 Atelectasis: Secondary | ICD-10-CM | POA: Diagnosis present

## 2019-10-15 DIAGNOSIS — E875 Hyperkalemia: Secondary | ICD-10-CM | POA: Diagnosis present

## 2019-10-15 DIAGNOSIS — Z90710 Acquired absence of both cervix and uterus: Secondary | ICD-10-CM

## 2019-10-15 DIAGNOSIS — R4182 Altered mental status, unspecified: Secondary | ICD-10-CM | POA: Diagnosis not present

## 2019-10-15 DIAGNOSIS — R339 Retention of urine, unspecified: Secondary | ICD-10-CM | POA: Diagnosis present

## 2019-10-15 DIAGNOSIS — I340 Nonrheumatic mitral (valve) insufficiency: Secondary | ICD-10-CM | POA: Diagnosis not present

## 2019-10-15 DIAGNOSIS — I361 Nonrheumatic tricuspid (valve) insufficiency: Secondary | ICD-10-CM | POA: Diagnosis not present

## 2019-10-15 DIAGNOSIS — G939 Disorder of brain, unspecified: Secondary | ICD-10-CM

## 2019-10-15 DIAGNOSIS — G629 Polyneuropathy, unspecified: Secondary | ICD-10-CM | POA: Diagnosis present

## 2019-10-15 DIAGNOSIS — I2510 Atherosclerotic heart disease of native coronary artery without angina pectoris: Secondary | ICD-10-CM | POA: Diagnosis present

## 2019-10-15 DIAGNOSIS — Z79890 Hormone replacement therapy (postmenopausal): Secondary | ICD-10-CM

## 2019-10-15 DIAGNOSIS — I081 Rheumatic disorders of both mitral and tricuspid valves: Secondary | ICD-10-CM | POA: Diagnosis present

## 2019-10-15 DIAGNOSIS — J9690 Respiratory failure, unspecified, unspecified whether with hypoxia or hypercapnia: Secondary | ICD-10-CM

## 2019-10-15 DIAGNOSIS — Z7982 Long term (current) use of aspirin: Secondary | ICD-10-CM

## 2019-10-15 DIAGNOSIS — I110 Hypertensive heart disease with heart failure: Secondary | ICD-10-CM | POA: Diagnosis present

## 2019-10-15 DIAGNOSIS — F0390 Unspecified dementia without behavioral disturbance: Secondary | ICD-10-CM | POA: Diagnosis present

## 2019-10-15 DIAGNOSIS — I5032 Chronic diastolic (congestive) heart failure: Secondary | ICD-10-CM | POA: Diagnosis present

## 2019-10-15 DIAGNOSIS — I6389 Other cerebral infarction: Principal | ICD-10-CM | POA: Diagnosis present

## 2019-10-15 DIAGNOSIS — Z8249 Family history of ischemic heart disease and other diseases of the circulatory system: Secondary | ICD-10-CM | POA: Diagnosis not present

## 2019-10-15 DIAGNOSIS — J9621 Acute and chronic respiratory failure with hypoxia: Secondary | ICD-10-CM | POA: Diagnosis not present

## 2019-10-15 DIAGNOSIS — Z90722 Acquired absence of ovaries, bilateral: Secondary | ICD-10-CM | POA: Diagnosis not present

## 2019-10-15 DIAGNOSIS — R404 Transient alteration of awareness: Secondary | ICD-10-CM | POA: Diagnosis not present

## 2019-10-15 DIAGNOSIS — R4701 Aphasia: Secondary | ICD-10-CM | POA: Diagnosis present

## 2019-10-15 DIAGNOSIS — R29715 NIHSS score 15: Secondary | ICD-10-CM | POA: Diagnosis not present

## 2019-10-15 DIAGNOSIS — I5030 Unspecified diastolic (congestive) heart failure: Secondary | ICD-10-CM | POA: Diagnosis not present

## 2019-10-15 DIAGNOSIS — I480 Paroxysmal atrial fibrillation: Secondary | ICD-10-CM | POA: Diagnosis present

## 2019-10-15 DIAGNOSIS — Z20822 Contact with and (suspected) exposure to covid-19: Secondary | ICD-10-CM | POA: Diagnosis present

## 2019-10-15 DIAGNOSIS — Z888 Allergy status to other drugs, medicaments and biological substances status: Secondary | ICD-10-CM

## 2019-10-15 DIAGNOSIS — K219 Gastro-esophageal reflux disease without esophagitis: Secondary | ICD-10-CM | POA: Diagnosis present

## 2019-10-15 DIAGNOSIS — D539 Nutritional anemia, unspecified: Secondary | ICD-10-CM | POA: Diagnosis present

## 2019-10-15 DIAGNOSIS — S299XXA Unspecified injury of thorax, initial encounter: Secondary | ICD-10-CM | POA: Diagnosis not present

## 2019-10-15 DIAGNOSIS — S065XAA Traumatic subdural hemorrhage with loss of consciousness status unknown, initial encounter: Secondary | ICD-10-CM | POA: Diagnosis present

## 2019-10-15 DIAGNOSIS — Z86010 Personal history of colonic polyps: Secondary | ICD-10-CM

## 2019-10-15 DIAGNOSIS — Y92121 Bathroom in nursing home as the place of occurrence of the external cause: Secondary | ICD-10-CM | POA: Diagnosis not present

## 2019-10-15 DIAGNOSIS — E782 Mixed hyperlipidemia: Secondary | ICD-10-CM | POA: Diagnosis present

## 2019-10-15 DIAGNOSIS — S81811A Laceration without foreign body, right lower leg, initial encounter: Secondary | ICD-10-CM | POA: Diagnosis not present

## 2019-10-15 DIAGNOSIS — Z87440 Personal history of urinary (tract) infections: Secondary | ICD-10-CM

## 2019-10-15 DIAGNOSIS — S065X9A Traumatic subdural hemorrhage with loss of consciousness of unspecified duration, initial encounter: Secondary | ICD-10-CM | POA: Diagnosis present

## 2019-10-15 DIAGNOSIS — Z66 Do not resuscitate: Secondary | ICD-10-CM | POA: Diagnosis present

## 2019-10-15 DIAGNOSIS — R519 Headache, unspecified: Secondary | ICD-10-CM | POA: Diagnosis not present

## 2019-10-15 DIAGNOSIS — W19XXXA Unspecified fall, initial encounter: Secondary | ICD-10-CM | POA: Diagnosis present

## 2019-10-15 DIAGNOSIS — F0391 Unspecified dementia with behavioral disturbance: Secondary | ICD-10-CM | POA: Diagnosis not present

## 2019-10-15 DIAGNOSIS — S3993XA Unspecified injury of pelvis, initial encounter: Secondary | ICD-10-CM | POA: Diagnosis not present

## 2019-10-15 DIAGNOSIS — M1990 Unspecified osteoarthritis, unspecified site: Secondary | ICD-10-CM | POA: Diagnosis present

## 2019-10-15 DIAGNOSIS — S0990XA Unspecified injury of head, initial encounter: Secondary | ICD-10-CM | POA: Diagnosis not present

## 2019-10-15 DIAGNOSIS — I10 Essential (primary) hypertension: Secondary | ICD-10-CM | POA: Diagnosis present

## 2019-10-15 DIAGNOSIS — I639 Cerebral infarction, unspecified: Secondary | ICD-10-CM | POA: Diagnosis not present

## 2019-10-15 DIAGNOSIS — Z79899 Other long term (current) drug therapy: Secondary | ICD-10-CM

## 2019-10-15 DIAGNOSIS — M1712 Unilateral primary osteoarthritis, left knee: Secondary | ICD-10-CM | POA: Diagnosis present

## 2019-10-15 DIAGNOSIS — I4891 Unspecified atrial fibrillation: Secondary | ICD-10-CM | POA: Diagnosis present

## 2019-10-15 LAB — BASIC METABOLIC PANEL
Anion gap: 9 (ref 5–15)
BUN: 44 mg/dL — ABNORMAL HIGH (ref 8–23)
CO2: 38 mmol/L — ABNORMAL HIGH (ref 22–32)
Calcium: 10 mg/dL (ref 8.9–10.3)
Chloride: 89 mmol/L — ABNORMAL LOW (ref 98–111)
Creatinine, Ser: 1.04 mg/dL — ABNORMAL HIGH (ref 0.44–1.00)
GFR calc Af Amer: 54 mL/min — ABNORMAL LOW (ref >60)
GFR calc non Af Amer: 46 mL/min — ABNORMAL LOW (ref >60)
Glucose, Bld: 100 mg/dL — ABNORMAL HIGH (ref 70–99)
Potassium: 5.8 mmol/L — ABNORMAL HIGH (ref 3.5–5.1)
Sodium: 136 mmol/L (ref 135–145)

## 2019-10-15 LAB — CBC WITH DIFFERENTIAL/PLATELET
Abs Immature Granulocytes: 0.15 10*3/uL — ABNORMAL HIGH (ref 0.00–0.07)
Basophils Absolute: 0.1 10*3/uL (ref 0.0–0.1)
Basophils Relative: 1 %
Eosinophils Absolute: 0.2 10*3/uL (ref 0.0–0.5)
Eosinophils Relative: 3 %
HCT: 30.5 % — ABNORMAL LOW (ref 36.0–46.0)
Hemoglobin: 9.4 g/dL — ABNORMAL LOW (ref 12.0–15.0)
Immature Granulocytes: 2 %
Lymphocytes Relative: 19 %
Lymphs Abs: 1.2 10*3/uL (ref 0.7–4.0)
MCH: 30.4 pg (ref 26.0–34.0)
MCHC: 30.8 g/dL (ref 30.0–36.0)
MCV: 98.7 fL (ref 80.0–100.0)
Monocytes Absolute: 0.6 10*3/uL (ref 0.1–1.0)
Monocytes Relative: 9 %
Neutro Abs: 4.2 10*3/uL (ref 1.7–7.7)
Neutrophils Relative %: 66 %
Platelets: 184 10*3/uL (ref 150–400)
RBC: 3.09 MIL/uL — ABNORMAL LOW (ref 3.87–5.11)
RDW: 13.1 % (ref 11.5–15.5)
WBC: 6.4 10*3/uL (ref 4.0–10.5)
nRBC: 0 % (ref 0.0–0.2)

## 2019-10-15 LAB — CK: Total CK: 47 U/L (ref 38–234)

## 2019-10-15 LAB — TROPONIN I (HIGH SENSITIVITY)
Troponin I (High Sensitivity): 7 ng/L (ref <18)
Troponin I (High Sensitivity): 8 ng/L (ref <18)

## 2019-10-15 LAB — BRAIN NATRIURETIC PEPTIDE: B Natriuretic Peptide: 260 pg/mL — ABNORMAL HIGH (ref 0.0–100.0)

## 2019-10-15 MED ORDER — ACETAMINOPHEN 650 MG RE SUPP: 650.0000 mg | Freq: Four times a day (QID) | RECTAL | Status: DC | PRN

## 2019-10-15 MED ORDER — PANTOPRAZOLE SODIUM 40 MG PO TBEC
40.0000 mg | DELAYED_RELEASE_TABLET | Freq: Every day | ORAL | Status: DC
  Administered 2019-10-16 – 2019-10-17 (×2): 40 mg via ORAL
  Filled 2019-10-15 (×4): qty 1

## 2019-10-15 MED ORDER — LEVOTHYROXINE SODIUM 75 MCG PO TABS
175.0000 ug | ORAL_TABLET | Freq: Every day | ORAL | Status: DC
  Administered 2019-10-16 – 2019-10-17 (×2): 175 ug via ORAL
  Filled 2019-10-15 (×4): qty 1

## 2019-10-15 MED ORDER — ASPIRIN 81 MG PO CHEW
81.0000 mg | CHEWABLE_TABLET | Freq: Every day | ORAL | Status: DC
  Administered 2019-10-16 – 2019-10-17 (×2): 81 mg via ORAL
  Filled 2019-10-15 (×4): qty 1

## 2019-10-15 MED ORDER — ONDANSETRON HCL 4 MG PO TABS: 4.0000 mg | ORAL_TABLET | Freq: Four times a day (QID) | ORAL | Status: DC | PRN

## 2019-10-15 MED ORDER — FUROSEMIDE 40 MG PO TABS
40.0000 mg | ORAL_TABLET | Freq: Every day | ORAL | Status: DC
  Administered 2019-10-16: 09:00:00 40 mg via ORAL
  Filled 2019-10-15: qty 1

## 2019-10-15 MED ORDER — ONDANSETRON HCL 4 MG/2ML IJ SOLN: 4.0000 mg | Freq: Four times a day (QID) | INTRAMUSCULAR | Status: DC | PRN

## 2019-10-15 MED ORDER — LISINOPRIL 5 MG PO TABS
5.0000 mg | ORAL_TABLET | Freq: Every day | ORAL | Status: DC
  Administered 2019-10-16: 09:00:00 5 mg via ORAL
  Filled 2019-10-15: qty 1

## 2019-10-15 MED ORDER — ACETAMINOPHEN 325 MG PO TABS: 650.0000 mg | ORAL_TABLET | Freq: Four times a day (QID) | ORAL | Status: DC | PRN

## 2019-10-15 MED ORDER — ENOXAPARIN SODIUM 40 MG/0.4ML ~~LOC~~ SOLN
40.0000 mg | SUBCUTANEOUS | Status: DC
  Administered 2019-10-15 – 2019-10-18 (×3): 40 mg via SUBCUTANEOUS
  Filled 2019-10-15 (×4): qty 0.4

## 2019-10-15 MED ORDER — HYDROCODONE-ACETAMINOPHEN 5-325 MG PO TABS
1.0000 | ORAL_TABLET | Freq: Four times a day (QID) | ORAL | Status: DC | PRN
  Administered 2019-10-15: 1 via ORAL
  Filled 2019-10-15: qty 1

## 2019-10-15 MED ORDER — CLOPIDOGREL BISULFATE 75 MG PO TABS
75.0000 mg | ORAL_TABLET | Freq: Every day | ORAL | Status: DC
  Administered 2019-10-16 – 2019-10-17 (×2): 75 mg via ORAL
  Filled 2019-10-15 (×4): qty 1

## 2019-10-15 MED ORDER — TETANUS-DIPHTH-ACELL PERTUSSIS 5-2.5-18.5 LF-MCG/0.5 IM SUSP
0.5000 mL | Freq: Once | INTRAMUSCULAR | Status: AC
  Administered 2019-10-15: 20:00:00 0.5 mL via INTRAMUSCULAR
  Filled 2019-10-15: qty 0.5

## 2019-10-15 MED ORDER — GADOBUTROL 1 MMOL/ML IV SOLN
5.0000 mL | Freq: Once | INTRAVENOUS | Status: AC | PRN
  Administered 2019-10-15: 5 mL via INTRAVENOUS

## 2019-10-15 MED ORDER — LIDOCAINE-EPINEPHRINE (PF) 1 %-1:200000 IJ SOLN
20.0000 mL | Freq: Once | INTRAMUSCULAR | Status: AC
  Administered 2019-10-15: 20 mL via INTRADERMAL
  Filled 2019-10-15: qty 30

## 2019-10-15 MED ORDER — POLYETHYLENE GLYCOL 3350 17 G PO PACK: 17.0000 g | PACK | Freq: Every day | ORAL | Status: DC | PRN

## 2019-10-15 MED ORDER — ASPIRIN 81 MG PO CHEW
162.0000 mg | CHEWABLE_TABLET | Freq: Once | ORAL | Status: AC
  Administered 2019-10-15: 162 mg via ORAL
  Filled 2019-10-15: qty 2

## 2019-10-15 MED ORDER — CLOPIDOGREL BISULFATE 75 MG PO TABS: 75.0000 mg | ORAL_TABLET | Freq: Every day | ORAL | Status: DC

## 2019-10-15 NOTE — H&P (Addendum)
History and Physical    Lisa Kemp [id number] DOB: 06/08/1924 DOA: 10/15/2019  PCP: Glenda Chroman, MD   Patient coming from: [facility]  I have personally briefly reviewed patient's old medical records in [place]  Chief Complaint: Fall  HPI: Lisa Kemp is a 86 y.o. adult with medical history significant for diastolic CHF, coronary artery disease, subdural hematoma, hypertension and dementia, with chronic respiratory failure on 3 L at baseline. History is obtained from nursing home and EDP, as at the time of my evaluation, the patient is awake and alert, she is not answering questions, but has incoherently. Patient was brought to the ED with reports of a fall in her bathroom at her ALF.  Nursing staff reported increasing confusion, and the patient has been diagnosed with UTI this week and is currently on Augmentin, last dose to be taken this evening.  Patient sustained a large skin tear to her right lateral lower leg.  Talk to nursing home staff, at baseline, patient can answer simple questions and hold simple conversations, she recognizes staff, but ambulates with a wheelchair, today she attempted to get up to use the bathroom on her own but she was supposed to ask for help but she did not because she has a predisposition to falls.  Confusion started about a week ago, with patient mumbling and muttering her urine had an odour and she was prescribed Augmentin to complete a 7 day course, last day.  Nursing home staff not aware of previous diagnosis of atrial fibrillation.  ED Course: Vitals.  WBC 6.4.  Normal CK 47.  Cr 5.8, baseline elevated serum bicarbonate at 38.  Portable chest x-ray without acute cardiopulmonary process.  Pelvic x-ray, right fibula/fibula x-ray negative for acute abnormality.  Head CT -6.2 cm region of abnormal hypodensity within the left temporal lobe, differentials include edema related to primary metastatic neoplasm ,  infectious/inflammatory process or recent ischemic infarct.  Subsequent MRI MRA brain - MRI brain, motion degraded, but shows acute to early subacute infarction affecting the left temporal lobe, with minor involvement of the insular and insular brain. Hospitalist admit for stroke work-up.  Review of Systems: Unable to assess due to baseline dementia  Past Medical History:  Diagnosis Date  . Arthritis   . CHF (congestive heart failure) ([place])   . Colon polyps   . Coronary atherosclerosis of native coronary artery    Nonobstructive at catherization 2006  . Dementia ([place])   . Esophageal ring    Distal esophageal ring status post dilation  . Gastrointestinal bleed    Related to NSAIDs  . GERD (gastroesophageal reflux disease)   . Hiatal hernia    Moderate sized sliding hiatal hernia  . Hypertension   . Hypothyroidism   . Intracranial hemorrhage ([place])    Left subcortical 2015  . Mixed hyperlipidemia    Statin intolerant  . Neuropathy     Past Surgical History:  Procedure Laterality Date  . APPENDECTOMY    . CATARACT EXTRACTION    . COLON SURGERY    . TOTAL ABDOMINAL HYSTERECTOMY    . TOTAL ABDOMINAL HYSTERECTOMY W/ BILATERAL SALPINGOOPHORECTOMY       reports that she has never smoked. She has never used smokeless tobacco. She reports that she does not drink alcohol or use drugs.  Allergies  Allergen Reactions  . Diazepam Other (See Comments)    Could not walk  . Statins Other (See Comments)    CRAMPING  Family History  Problem Relation Age of Onset  . Heart attack Mother   . Heart attack Father   . Hypertension Other     Prior to Admission medications   Medication Sig Start Date End Date Taking? Authorizing Provider  acetaminophen (TYLENOL) 500 MG tablet Take 500 mg by mouth every 8 (eight) hours as needed for mild pain or moderate pain.   Yes [provider]  aspirin 81 MG tablet Take 81 mg by mouth daily.   Yes [provider]  atenolol  (TENORMIN) 25 MG tablet TAKE ONE TABLET BY MOUTH TWICE DAILY Patient taking differently: Take 25 mg by mouth 2 (two) times daily.  03/08/16  Yes Satira Sark, MD  dextromethorphan-guaiFENesin (TUSSIN DM) 10-100 MG/5ML liquid Take 5 mLs by mouth every 6 (six) hours as needed for cough.   Yes [provider]  docusate sodium (COLACE) 100 MG capsule Take 1 capsule (100 mg total) by mouth daily. 03/07/19  Yes Shah, Pratik D, DO  ferrous sulfate 325 (65 FE) MG tablet Take 325 mg by mouth daily.   Yes [provider]  furosemide (LASIX) 40 MG tablet Take 1 tablet (40 mg total) by mouth daily. 03/06/19 03/05/20 Yes Shah, Pratik D, DO  gabapentin (NEURONTIN) 100 MG capsule Take 1 capsule (100 mg total) by mouth at bedtime. 01/01/14  Yes Angiulli, Lavon Paganini, PA-C  levothyroxine (SYNTHROID) 175 MCG tablet Take 175 mcg by mouth daily. 10/02/19  Yes [provider]  lisinopril (PRINIVIL,ZESTRIL) 5 MG tablet Take 5 mg by mouth daily.   Yes [provider]  loperamide (IMODIUM A-D) 2 MG tablet Take 2 mg by mouth 4 (four) times daily as needed for diarrhea or loose stools.   Yes [provider]  LORazepam (ATIVAN) 1 MG tablet Take 0.5-1 mg by mouth See admin instructions. Take 0.5mg  by mouth daily in the evening. Take 1mg   At bedtime   Yes [provider]  nitroGLYCERIN (NITROSTAT) 0.4 MG SL tablet Place 1 tablet (0.4 mg total) under the tongue as directed. 06/19/16  Yes Satira Sark, MD  omeprazole (PRILOSEC) 40 MG capsule Take 40 mg by mouth daily.    Yes [provider]  ondansetron (ZOFRAN) 4 MG tablet TAKE 1 TABLET BY MOUTH TWICE DAILY AS NEEDED FOR NAUSEA. Patient taking differently: Take 4 mg by mouth 3 (three) times daily as needed for nausea or vomiting.  12/27/16  Yes Rehman, Mechele Dawley, MD  Oyster Shell (OYSTER CALCIUM) 500 MG TABS tablet Take 500 mg of elemental calcium by mouth 2 (two) times daily.    Yes [provider]  potassium  chloride (K-DUR) 10 MEQ tablet Take 1 tablet (10 mEq total) by mouth daily. 03/06/19 10/15/19 Yes Shah, Pratik D, DO  simethicone (MYLICON) 80 MG chewable tablet Chew 80 mg by mouth 2 (two) times daily.   Yes [provider]  trolamine salicylate (ASPERCREME) 10 % cream Apply 1 application topically 2 (two) times daily as needed for muscle pain.   Yes [provider]  vitamin B-12 (CYANOCOBALAMIN) 500 MCG tablet Take 500 mcg by mouth daily.   Yes [provider]  amoxicillin-clavulanate (AUGMENTIN) 500-125 MG tablet Take 1 tablet by mouth 2 (two) times daily. 10/08/19   [provider]    Physical Exam: Exam limited by patient's mental status Vitals:   10/15/19 1645 10/15/19 1647  BP: (!) 161/67   Pulse: 86   Resp: 18   Temp: 98.2 F (36.8 C)  TempSrc: Oral   SpO2: 100%   Weight:  76 kg  Height:  5\' 5"  (1.651 m)    Constitutional: Calm comfortable no abnormalities detected Vitals:   10/15/19 1645 10/15/19 1647  BP: (!) 161/67   Pulse: 86   Resp: 18   Temp: 98.2 F (36.8 C)   TempSrc: Oral   SpO2: 100%   Weight:  76 kg  Height:  5\' 5"  (1.651 m)   Eyes: PERRL, lids and conjunctivae normal ENMT: Unable to fully examine due to mental status Neck: normal, supple, no masses, no thyromegaly Respiratory: Normal respiratory effort. No accessory muscle use.  Cardiovascular: Irregular rate and rhythm,  No extremity edema. 2+ pedal pulses.   Abdomen: no tenderness, no masses palpated. No hepatosplenomegaly. Bowel sounds positive.  Musculoskeletal: no clubbing / cyanosis. No joint deformity upper and lower extremities. Good ROM, no contractures.  Skin: ~10 cm longitudinal skin tear involving lateral side of left leg, No induration Neurologic: Unable to fully examine.  Pupils moving spontaneously, moving all extremities spontaneously, appears to have normal tone, unable to determine strength Psychiatric: Awake, alert, not verbalizing, appears confused.   Continuously picking at the spot on her chest, which now appears to be bleeding  Labs on Admission: I have personally reviewed following labs and imaging studies  CBC: Recent Labs  Lab 10/15/19 1656  WBC 6.4  NEUTROABS 4.2  HGB 9.4*  HCT 30.5*  MCV 98.7  PLT [id number]   Basic Metabolic Panel: Recent Labs  Lab 10/15/19 1656  NA 136  K 5.8*  CL 89*  CO2 38*  GLUCOSE 100*  BUN 44*  CREATININE 1.04*  CALCIUM 10.0    Recent Labs  Lab 10/15/19 1656  CKTOTAL 47   Radiological Exams on Admission: DG Chest 1 View  Result Date: 10/15/2019 CLINICAL DATA:  86 year old female with fall.  Shortness of breath. EXAM: CHEST  1 VIEW COMPARISON:  Chest radiograph dated 03/04/2019. FINDINGS: A crescentic airspace opacity at the medial right lung base, likely atelectasis secondary to a moderate-sized hiatal hernia. The lungs are otherwise clear. There is no pleural effusion or pneumothorax. Mild cardiomegaly. Atherosclerotic calcification of the aorta. No acute osseous pathology. Osteopenia. IMPRESSION: Probable area of atelectasis in the right infrahilar region secondary to a moderate size hiatal hernia. No definite acute cardiopulmonary process. Electronically Signed   By: Anner Crete M.D.   On: 10/15/2019 18:01   DG Pelvis 1-2 Views  Result Date: 10/15/2019 CLINICAL DATA:  86 year old female with fall. EXAM: PELVIS - 1-2 VIEW COMPARISON:  None. FINDINGS: There is no acute fracture or dislocation. The bones are osteopenic. There is degenerative changes of the visualized lower lumbar spine. Multiple surgical clips noted over the pelvis. The soft tissues are unremarkable. Small radiopaque foci over the lower abdomen bilaterally may represent fecalith versus renal calculi. Vascular calcifications noted. IMPRESSION: No acute fracture or dislocation. Electronically Signed   By: Anner Crete M.D.   On: 10/15/2019 18:03   DG Tibia/Fibula Right  Result Date: 10/15/2019 CLINICAL DATA:   86 year old female status post fall with right lower leg skin tear. EXAM: RIGHT TIBIA AND FIBULA - 2 VIEW COMPARISON:  None. FINDINGS: Calcified peripheral vascular disease. Generalized soft tissue stranding. No soft tissue gas identified. No radiopaque foreign body identified. No acute osseous abnormality identified. Severe lateral compartment joint degeneration at the right knee. IMPRESSION: 1. No acute osseous abnormality identified about the right tib-fib. Severe lateral compartment right knee joint degeneration. 2. Calcified peripheral vascular disease. Generalized soft  tissue stranding. Electronically Signed   By: Genevie Ann M.D.   On: 10/15/2019 18:03   CT Head Wo Contrast  Result Date: 10/15/2019 CLINICAL DATA:  Head trauma, headache. Additional history provided: Unwitnessed fall, increased confusion this week, recent diagnosis of UTI, history of hypertension, dementia, intracranial hemorrhage EXAM: CT HEAD WITHOUT CONTRAST TECHNIQUE: Contiguous axial images were obtained from the base of the skull through the vertex without intravenous contrast. COMPARISON:  Head CT 03/22/2015. FINDINGS: Brain: There is no evidence of acute intracranial hemorrhage. No midline shift or extra-axial fluid collection. There is a region of abnormal hypodensity within the left temporal lobe measuring 6.2 x 2.0 x 1.8 cm (AP x TV x CC) (series 2, image 15) (series 4, image 48). This abnormal hypodensity appears to predominantly affect the white matter. Mild for age ill-defined hypoattenuation within the remainder of the cerebral white matter is nonspecific, but consistent with chronic small vessel ischemic disease. Mild generalized parenchymal atrophy. Redemonstrated nonspecific irregular dural-based calcifications along right frontal convexity. Vascular: No hyperdense vessel. Atherosclerotic calcifications. Skull: No calvarial fracture or aggressive osseous lesion. Sinuses/Orbits: Visualized orbits demonstrate no acute  abnormality. No significant paranasal sinus disease or mastoid effusion. Other: Right parietal scalp soft tissue swelling These results were called by telephone at the time of interpretation on 10/15/2019 at 5:51 pm to provider [facility] , who verbally acknowledged these results. IMPRESSION: 6.2 cm region of abnormal hypodensity within the left temporal lobe predominantly affecting the white matter. This finding is new from CT 03/22/2015 and indeterminate. Primary differential considerations include edema related to a primary or metastatic neoplasm versus an infectious/inflammatory process. Recent ischemic infarction is also a consideration, although this would be an atypical appearance. Contrast-enhanced brain MRI is recommended for further evaluation. Background mild generalized parenchymal atrophy and chronic small vessel ischemic disease. No evidence of acute intracranial hemorrhage. Right parietal scalp soft tissue swelling. Electronically Signed   By: Kellie Simmering DO   On: 10/15/2019 17:51   MR ANGIO HEAD WO CONTRAST  Result Date: 10/15/2019 CLINICAL DATA:  [place].  Left temporal abnormality. EXAM: MRA HEAD WITHOUT CONTRAST TECHNIQUE: Angiographic images of the Circle of Willis were obtained using MRA technique without intravenous contrast. COMPARISON:  Brain study same day FINDINGS: The study is limited by motion degradation. Both internal carotid arteries are patent through the skull base and siphon regions. There is flow in the anterior and middle cerebral arteries. Distal vessels cannot be evaluated. Both vertebral arteries are patent to the basilar. The basilar artery shows flow. Both superior cerebellar arteries and posterior cerebral arteries show flow. IMPRESSION: Limited and motion degraded exam. Flow within the major vessels at the base of the brain. Distal vessel information not reliable because of pronounced motion. Electronically Signed   By: Nelson Chimes M.D.   On: 10/15/2019 19:20   MR  BRAIN WO CONTRAST  Result Date: 10/15/2019 CLINICAL DATA:  Encephalopathy. Unwitnessed fall with trauma to the head. EXAM: MRI HEAD WITHOUT CONTRAST TECHNIQUE: Multiplanar, multiecho pulse sequences of the brain and surrounding structures were obtained without intravenous contrast. COMPARISON:  Head CT same day.  Head CT 03/22/2015. FINDINGS: MRI HEAD FINDINGS The study is limited and abbreviated by patient motion. Brain: There is acute/subacute infarction affecting the left temporal lobe, with minor involvement of the insular brain. The study is markedly degraded by motion, but I do not have significant suspicion regarding tumor or cerebritis. No gross hemorrhage. No significant mass effect or shift. Elsewhere, there chronic small-vessel ischemic changes of  the white matter. Vascular: No vascular information available. Skull and upper cervical spine: No abnormality seen. Sinuses/Orbits: Negative Other: None IMPRESSION: Acute to early subacute infarction affecting the left temporal lobe, with minor involvement of the adjacent insular brain. The study is limited and abbreviated by patient motion. I do not have any significant suspicion regarding tumor or cerebritis. Electronically Signed   By: Nelson Chimes M.D.   On: 10/15/2019 19:17    EKG: Independently reviewed.  Assessment/Plan Principal Problem:   Acute CVA (cerebrovascular accident) ([place]) Active Problems:   Essential hypertension, benign   SDH (subdural hematoma) ([facility])   Hypothyroidism   Dementia ([facility])   Unspecified atrial fibrillation ([place])   Acute CVA- unable to tell exact deficits at this time, but her speech appears to be different, and she appears confused.  MRA, MRI brain done, motion degraded but shows acute to early subacute infarction affecting the left temporal lobe, minor involvement of adjacent insular brain.  EKG showing atrial fibrillation which appears to be new onset. -Echocardiogram - Carotid Doppler -Lipid panel,Hgba1c in  a.m. -Bedside swallow evaluation - PT, OT, speech therapy evaluation, determine if patient can return to previous level of functioning at the ALF or will need placement -Neurology consult -Continue aspirin and Plavix - Allergy to statins- cramps.  New onset atrial fibrillation-rate controlled, heart rate 80s.  Home medications include aspirin, atenolol 25 mg every 12 hourly.  Patient presented with a fall today, per nursing home predisposition to falls.  Nursing home staff not aware of a prior diagnosis of atrial fibrillation. - Considering advanced age, concern for subsequent falls, will hold off on starting anticoagulation - . CHADS2VASc score- 7. HASBLED Score - 4. -Repeat EKG in a.m. -Continue aspirin, add Plavix. -Follow-up echocardiogram -Resume atenolol  Metabolic encephalopathy with baseline dementia- recent diagnosis of UTI completing 5 day course of Augmentin today.  Differentials acute CVA.  Ambulates with wheelchair at baseline, able to hold simple conversation. Per nursing home staff patient does not like to ask for help hence fall today. -Obtain UA and urine cultures  Fall with Skin injury -sutures applied in ED to be removed in 7 to 10 days -Hydrocodone every 6 hourly for pain  Hypertension-systolic [id number]. -Resume home atenolol, Lasix 40 daily, lisinopril 5 mg,  Hyperkalemia-potassium 5.8. -Hold home potassium supplements, may need to discontinue home lisinopril  History of subdural hematoma  Hypothyroidism -Resume home Synthroid  Diastolic CHF with chronic respiratory failure- Stable and compensated.   Echo 2020 EF 55 to 60%. -Resume home Lasix -Potassium elevated at 5.8, I have discontinued home 10 mg daily K-Dur supplements   DVT prophylaxis: LOvenox Code Status: DNR, confirmed with nursing home. Family Communication: none at bedside.  Disposition Plan: 1 -2 days Consults called: Neurology Admission status: Inp, tele     Bethena Roys MD [facility]  10/15/2019, 9:07 PM

## 2019-10-15 NOTE — ED Triage Notes (Signed)
PT transported by [facility] today from [facility] for a fall in her bathroom. Nursing staff at [facility] states she has had increased confusion with dx of UTI this week and is currently on Augmentin BID (500-125) and takes her last scheduled dose this evening. PT took herself to the bathroom and fell at the toliet area and obtained large skintear to right lateral lower leg and some blood noted to right foot third toe. PT is normally on 3L per N/C continuously.

## 2019-10-15 NOTE — ED Provider Notes (Addendum)
[facility] EMERGENCY DEPARTMENT Provider Note   CSN: [id number] Arrival date & time: 10/15/19  1628     History No chief complaint on file.   Lisa Kemp is a 86 y.o. adult.  Past medical history heart failure, dementia, hypothyroidism presents to ER after fall.  New  History limited due to baseline dementia, level 5 caveat  Unwitnessed fall, found on ground, right lower leg skin tear.  Patient states that she has some pain around site of skin tear but denies any other symptoms.  Specifically she denies any neck pain, back pain, chest or abdominal pain.  Reviewed chart sent by facility, finishing course of Augmentin, no indication for antibiotic.  Last noted from epic is discharge summary in July 2020 for heart failure  HPI     Past Medical History:  Diagnosis Date  . Arthritis   . CHF (congestive heart failure) ([place])   . Colon polyps   . Coronary atherosclerosis of native coronary artery    Nonobstructive at catherization 2006  . Dementia ([place])   . Esophageal ring    Distal esophageal ring status post dilation  . Gastrointestinal bleed    Related to NSAIDs  . GERD (gastroesophageal reflux disease)   . Hiatal hernia    Moderate sized sliding hiatal hernia  . Hypertension   . Hypothyroidism   . Intracranial hemorrhage ([place])    Left subcortical 2015  . Mixed hyperlipidemia    Statin intolerant  . Neuropathy     Patient Active Problem List   Diagnosis Date Noted  . Acute diastolic (congestive) heart failure ([place]) 03/05/2019  . Acute CHF (congestive heart failure) ([place]) 03/04/2019  . Hyponatremia 09/12/2018  . HCAP (healthcare-associated pneumonia) 09/11/2018  . Hypothyroidism 09/11/2018  . GERD (gastroesophageal reflux disease) 09/11/2018  . Dementia ([place]) 09/11/2018  . Elevated troponin 09/11/2018  . Carotid atherosclerosis 06/11/2014  . Osteoarthritis of left knee 02/17/2014  . SDH (subdural hematoma) ([place]) 12/25/2013  . Intracerebral hemorrhage  ([place]) 12/22/2013  . Essential hypertension, benign 08/01/2009  . Mixed hyperlipidemia 06/12/2009  . CAD, NATIVE VESSEL 06/12/2009    Past Surgical History:  Procedure Laterality Date  . APPENDECTOMY    . CATARACT EXTRACTION    . COLON SURGERY    . TOTAL ABDOMINAL HYSTERECTOMY    . TOTAL ABDOMINAL HYSTERECTOMY W/ BILATERAL SALPINGOOPHORECTOMY       OB History   No obstetric history on file.     Family History  Problem Relation Age of Onset  . Heart attack Mother   . Heart attack Father   . Hypertension Other     Social History   Tobacco Use  . Smoking status: Never Smoker  . Smokeless tobacco: Never Used  Substance Use Topics  . Alcohol use: No    Alcohol/week: 0.0 standard drinks    Comment: 06/11/14 -- "Never tasted a beer, wine, or a whiskey.." - AJ  . Drug use: No    Home Medications Prior to Admission medications   Medication Sig Start Date End Date Taking? Authorizing Provider  acetaminophen (TYLENOL) 500 MG tablet Take 500 mg by mouth every 8 (eight) hours as needed for mild pain or moderate pain.    [provider]  aspirin 81 MG tablet Take 81 mg by mouth daily.    [provider]  atenolol (TENORMIN) 25 MG tablet TAKE ONE TABLET BY MOUTH TWICE DAILY Patient taking differently: Take 25 mg by mouth 2 (two) times daily.  03/08/16   Rozann Lesches  G, MD  dextromethorphan-guaiFENesin (TUSSIN DM) 10-100 MG/5ML liquid Take 5 mLs by mouth every 6 (six) hours as needed for cough.    [provider]  docusate sodium (COLACE) 100 MG capsule Take 1 capsule (100 mg total) by mouth daily. 03/07/19   Manuella Ghazi, Pratik D, DO  ferrous sulfate 325 (65 FE) MG tablet Take 325 mg by mouth daily.    [provider]  furosemide (LASIX) 40 MG tablet Take 1 tablet (40 mg total) by mouth daily. 03/06/19 03/05/20  Manuella Ghazi, Pratik D, DO  gabapentin (NEURONTIN) 100 MG capsule Take 1 capsule (100 mg total) by mouth at bedtime. 01/01/14   Angiulli, Lavon Paganini, PA-C    levothyroxine (SYNTHROID, LEVOTHROID) 150 MCG tablet Take 150 mcg by mouth daily before breakfast.    [provider]  lisinopril (PRINIVIL,ZESTRIL) 5 MG tablet Take 5 mg by mouth daily.    [provider]  loperamide (IMODIUM A-D) 2 MG tablet Take 2 mg by mouth 4 (four) times daily as needed for diarrhea or loose stools.    [provider]  LORazepam (ATIVAN) 1 MG tablet Take 0.5-1 mg by mouth See admin instructions. Take 0.5mg  by mouth daily in the evening. Take 1mg   At bedtime    [provider]  nitroGLYCERIN (NITROSTAT) 0.4 MG SL tablet Place 1 tablet (0.4 mg total) under the tongue as directed. 06/19/16   Satira Sark, MD  omeprazole (PRILOSEC) 40 MG capsule Take 40 mg by mouth daily.     [provider]  ondansetron (ZOFRAN) 4 MG tablet TAKE 1 TABLET BY MOUTH TWICE DAILY AS NEEDED FOR NAUSEA. Patient taking differently: Take 4 mg by mouth 3 (three) times daily as needed for nausea or vomiting.  12/27/16   Rehman, Mechele Dawley, MD  Oyster Shell (OYSTER CALCIUM) 500 MG TABS tablet Take 500 mg of elemental calcium by mouth 2 (two) times daily.     [provider]  potassium chloride (K-DUR) 10 MEQ tablet Take 1 tablet (10 mEq total) by mouth daily. 03/06/19 04/05/19  Manuella Ghazi, Pratik D, DO  simethicone (MYLICON) 80 MG chewable tablet Chew 80 mg by mouth 2 (two) times daily.    [provider]  trolamine salicylate (ASPERCREME) 10 % cream Apply 1 application topically 2 (two) times daily as needed for muscle pain.    [provider]  vitamin B-12 (CYANOCOBALAMIN) 500 MCG tablet Take 500 mcg by mouth daily.    [provider]    Allergies    Diazepam and Statins  Review of Systems   Review of Systems  Unable to perform ROS: Dementia    Physical Exam Updated Vital Signs There were no vitals taken for this visit.  Physical Exam Vitals and nursing note reviewed.  Constitutional:      Appearance: She is  well-developed.     Comments: Pleasantly demented, lying in bed no acute distress  HENT:     Head: Normocephalic and atraumatic.  Eyes:     Conjunctiva/sclera: Conjunctivae normal.  Cardiovascular:     Rate and Rhythm: Normal rate and regular rhythm.     Heart sounds: No murmur.  Pulmonary:     Effort: Pulmonary effort is normal. No respiratory distress.     Breath sounds: Normal breath sounds.  Abdominal:     Palpations: Abdomen is soft.     Tenderness: There is no abdominal tenderness.  Musculoskeletal:     Cervical back: Neck supple.     Comments: RLE: 4cm long  superficial skin tear, no active bleeding, not contaminated LLE: no TTP throughout, no deformity RUE: no TTP throughout, no deformity LUE: no TTP throughout, no deformity Back: no C, T, L spine TTP  Skin:    General: Skin is warm and dry.  Neurological:     Mental Status: She is alert.     Comments: Alert, oriented to person, but not place or time; follows commands, moves all four extremities equally  Psychiatric:        Mood and Affect: Mood normal.     ED Results / Procedures / Treatments   Labs (all labs ordered are listed, but only abnormal results are displayed) Labs Reviewed  CBC WITH DIFFERENTIAL/PLATELET  BASIC METABOLIC PANEL  CK  BRAIN NATRIURETIC PEPTIDE    EKG None  Radiology No results found.  Procedures .Critical Care Performed by: Lucrezia Starch, MD Authorized by: Lucrezia Starch, MD   Critical care provider statement:    Critical care time (minutes):  45   Critical care was necessary to treat or prevent imminent or life-threatening deterioration of the following conditions:  CNS failure or compromise   Critical care was time spent personally by me on the following activities:  Discussions with consultants, evaluation of patient's response to treatment, examination of patient, ordering and performing treatments and interventions, ordering and review of laboratory studies,  ordering and review of radiographic studies, pulse oximetry, re-evaluation of patient's condition, obtaining history from patient or surrogate and review of old charts .Marland KitchenLaceration Repair  Date/Time: 10/15/2019 9:39 PM Performed by: Lucrezia Starch, MD Authorized by: Lucrezia Starch, MD   Anesthesia (see MAR for exact dosages):    Anesthesia method:  Local infiltration   Local anesthetic:  Lidocaine 1% WITH epi Laceration details:    Location:  Leg   Leg location:  R lower leg   Length (cm):  5 Repair type:    Repair type:  Simple Exploration:    Hemostasis achieved with:  Direct pressure   Contaminated: no   Treatment:    Area cleansed with:  Betadine and saline   Amount of cleaning:  Extensive   Irrigation solution:  Sterile saline   Irrigation method:  Syringe Skin repair:    Repair method:  Sutures and Steri-Strips   Suture size:  4-0   Suture material:  Nylon   Suture technique:  Simple interrupted   Number of sutures:  5   Number of Steri-Strips:  4 Approximation:    Approximation:  Close Post-procedure details:    Patient tolerance of procedure:  Tolerated well, no immediate complications   (including critical care time)  Medications Ordered in ED Medications  Tdap (BOOSTRIX) injection 0.5 mL (has no administration in time range)    ED Course  I have reviewed the triage vital signs and the nursing notes.  Pertinent labs & imaging results that were available during my care of the patient were reviewed by me and considered in my medical decision making (see chart for details).  Clinical Course as of Oct 14 1940  Thu Oct 15, 2019  1639 Completed initial assessment   [RD]  1751 D/w radiologist - mass vs stroke; will check MRI, will need hospital admission regardless and either NSGY or neuro consults   [RD]  1851 Discussed with hospitalist - wants MRI back prior to accepting   [RD]  1938 D/w sister who is emergency contact, provided update   [RD]  1938  Discussed with hospitalist, Dr. Johnette Abraham who will  admit   [RD]    Clinical Course User Index [RD] Lucrezia Starch, MD   MDM Rules/Calculators/A&P                      A 86 year old lady presents to ER after unwitnessed fall.  Lives in assisted living, found on the ground.  Trauma exam concerning for laceration to right lower leg - repaired with sutures, steri strips.  Otherwise no traumatic findings on my physical exam.  Trauma work-up negative for any other acute injuries.  Noted history of dementia in chart but patient seems to be more confused.  CT head was obtained to further evaluate.  CT head concerning for new brain lesion - stroke or mass. MRI confirmed acute stroke. No clear onset of symptoms from facility or family. Discussed with hospitalist, Dr. Denton Brick who will admit.   Final Clinical Impression(s) / ED Diagnoses Final diagnoses:  Brain lesion  Cerebrovascular accident (CVA), unspecified mechanism ([place])    Rx / DC Orders ED Discharge Orders    None       Lucrezia Starch, MD 10/15/19 1950    Lucrezia Starch, MD 10/15/19 [id number]    Lucrezia Starch, MD 10/15/19 2330

## 2019-10-16 ENCOUNTER — Inpatient Hospital Stay (HOSPITAL_COMMUNITY): Payer: Medicare Other

## 2019-10-16 DIAGNOSIS — I361 Nonrheumatic tricuspid (valve) insufficiency: Secondary | ICD-10-CM

## 2019-10-16 DIAGNOSIS — I340 Nonrheumatic mitral (valve) insufficiency: Secondary | ICD-10-CM

## 2019-10-16 DIAGNOSIS — G9341 Metabolic encephalopathy: Secondary | ICD-10-CM | POA: Diagnosis present

## 2019-10-16 LAB — HEMOGLOBIN A1C
Hgb A1c MFr Bld: 5.6 % (ref 4.8–5.6)
Mean Plasma Glucose: 114.02 mg/dL

## 2019-10-16 LAB — CBC
HCT: 29.7 % — ABNORMAL LOW (ref 36.0–46.0)
Hemoglobin: 9.2 g/dL — ABNORMAL LOW (ref 12.0–15.0)
MCH: 30 pg (ref 26.0–34.0)
MCHC: 31 g/dL (ref 30.0–36.0)
MCV: 96.7 fL (ref 80.0–100.0)
Platelets: 198 10*3/uL (ref 150–400)
RBC: 3.07 MIL/uL — ABNORMAL LOW (ref 3.87–5.11)
RDW: 13 % (ref 11.5–15.5)
WBC: 7 10*3/uL (ref 4.0–10.5)
nRBC: 0 % (ref 0.0–0.2)

## 2019-10-16 LAB — LIPID PANEL
Cholesterol: 177 mg/dL (ref 0–200)
HDL: 45 mg/dL (ref >40)
LDL Cholesterol: 105 mg/dL — ABNORMAL HIGH (ref 0–99)
Total CHOL/HDL Ratio: 3.9 RATIO
Triglycerides: 137 mg/dL (ref <150)
VLDL: 27 mg/dL (ref 0–40)

## 2019-10-16 LAB — BASIC METABOLIC PANEL
Anion gap: 11 (ref 5–15)
BUN: 42 mg/dL — ABNORMAL HIGH (ref 8–23)
CO2: 36 mmol/L — ABNORMAL HIGH (ref 22–32)
Calcium: 10.1 mg/dL (ref 8.9–10.3)
Chloride: 89 mmol/L — ABNORMAL LOW (ref 98–111)
Creatinine, Ser: 0.93 mg/dL (ref 0.44–1.00)
GFR calc Af Amer: >60 mL/min (ref >60)
GFR calc non Af Amer: 53 mL/min — ABNORMAL LOW (ref >60)
Glucose, Bld: 95 mg/dL (ref 70–99)
Potassium: 5.1 mmol/L (ref 3.5–5.1)
Sodium: 136 mmol/L (ref 135–145)

## 2019-10-16 LAB — ECHOCARDIOGRAM COMPLETE
Height: 65 in
Weight: 2610.25 oz

## 2019-10-16 LAB — SARS CORONAVIRUS 2 (TAT 6-24 HRS): SARS Coronavirus 2: NEGATIVE

## 2019-10-16 MED ORDER — FUROSEMIDE 40 MG PO TABS
40.0000 mg | ORAL_TABLET | Freq: Every day | ORAL | Status: DC
  Filled 2019-10-16: qty 1

## 2019-10-16 MED ORDER — LISINOPRIL 5 MG PO TABS
5.0000 mg | ORAL_TABLET | Freq: Every day | ORAL | Status: DC
  Filled 2019-10-16 (×2): qty 1

## 2019-10-16 MED ORDER — ATORVASTATIN CALCIUM 20 MG PO TABS
20.0000 mg | ORAL_TABLET | Freq: Every day | ORAL | Status: DC
  Administered 2019-10-16 – 2019-10-19 (×2): 20 mg via ORAL
  Filled 2019-10-16 (×3): qty 1

## 2019-10-16 NOTE — Progress Notes (Signed)
PT Cancellation Note  Patient Details Name: Lisa Kemp MRN: [id number] DOB: Jul 05, 1924   Cancelled Treatment:    Reason Eval/Treat Not Completed: Patient's level of consciousness Attempted to see pt this am, pt asleep with mitts on, not responding to name. RN came by room and requested we wait until later to see pt as she has been agitated overnight. Will attempt evaluation at a later time.    8:14 AM, 10/16/19 Mearl Latin PT, DPT Physical Therapist at [facility]

## 2019-10-16 NOTE — Plan of Care (Signed)
  Problem: Education: Goal: Knowledge of General Education information will improve Description: Including pain rating scale, medication(s)/side effects and non-pharmacologic comfort measures Outcome: Progressing   Problem: Nutrition: Goal: Adequate nutrition will be maintained Outcome: Progressing   Problem: Safety: Goal: Ability to remain free from injury will improve Outcome: Progressing   Problem: Skin Integrity: Goal: Risk for impaired skin integrity will decrease Outcome: Progressing   Problem: Education: Goal: Knowledge of disease or condition will improve Outcome: Progressing Goal: Knowledge of patient specific risk factors addressed and post discharge goals established will improve Outcome: Progressing

## 2019-10-16 NOTE — Consult Note (Signed)
[place] A. Merlene Laughter, MD     [email or web address]          Lisa Kemp is an 86 y.o. adult.   ASSESSMENT/PLAN: 1. Acute mental status changes due to acute aphasia from left temporal infarct: Risk factors new onset atrial fibrillation, age hypertension and dyslipidemia. There seems no contraindication to anticoagulation and therefore this is recommended. This can be started by initiation of Eliquis 1 week after the ischemic event to minimize the risk of hemorrhagic transformation. In the meantime dual antiplatelet agents are fine.  Carotid duplex Doppler should be obtained to complete the workup. 2. Baseline dementia 3. Baseline gait impairment that is most likely multifactorial    The patient is a 86 year old white female who presents with a week history of cognitive impairment over baseline. She typically is lucid and coherent in recognizes people at the facility that she stays. She ambulates in a wheelchair. However, over last week she is being confused with alteration of her baseline mentation. She was diagnosed with urinary tract infection and has been treated with antibiotics over the last several days. However, it appears the confusion appears to not improving fact was deteriorating and hence the patient was taken to the emergency room for further evaluation. There are no clear reports of focal deficits. The patient has significant cognitive impairment during evaluation if her review of system is not obtainable.   GENERAL:   This is an average weight female who is lying in bed with eyes closed. She is in no acute distress.  HEENT:  Neck is supple no trauma appreciated. This some skin hyperpigmentation of the skin.  ABDOMEN: soft  EXTREMITIES: No edema; there appears to be passed surgery of the right leg consistent with fasciotomy. There is mild erythema of the distal legs with scaly appearance.   BACK: Normal  SKIN: Normal by inspection.    MENTAL  STATUS:  She lays in bed with eyes closed but opens her eyes to verbal commands. She clearly has a marked receptive aphasia resulting in grade difficulties following commands. She does speak in simple sentences but this is often nonsensical. I do not appreciate dysarthria.   CRANIAL NERVES: Pupils are equal, round and reactive to light; extra ocular movements are full, there is no significant nystagmus; visual fields are full; upper and lower facial muscles are normal in strength and symmetric, there is no flattening of the nasolabial folds; tongue is midline; shoulder elevation is normal.  MOTOR:  Tone and bulk are normal throughout. Exact strength is difficult given the difficulties following commands. She has at least 3/5 strength in the upper extremities. Right lower extremity and left lower extremity a great at least 2/5. No drift is noted in the upper extremities.  COORDINATION:  No dysmetria, myoclonus or tremors are noted. No evidence of parkinsonism.   REFLEXES: Deep tendon reflexes are symmetrical and normal. Plantar reflexes are flexor on the left and extensor on the right.   SENSATION: Normal to pain.     Blood pressure (!) 161/73, pulse 81, temperature 98.2 F (36.8 C), temperature source Oral, resp. rate 18, height 5\' 5"  (1.651 m), weight 74 kg, SpO2 93 %.  Past Medical History:  Diagnosis Date  . Arthritis   . CHF (congestive heart failure) ([place])   . Colon polyps   . Coronary atherosclerosis of native coronary artery    Nonobstructive at catherization 2006  . Dementia ([place])   . Esophageal ring    Distal esophageal ring status post  dilation  . Gastrointestinal bleed    Related to NSAIDs  . GERD (gastroesophageal reflux disease)   . Hiatal hernia    Moderate sized sliding hiatal hernia  . Hypertension   . Hypothyroidism   . Intracranial hemorrhage ([facility])    Left subcortical 2015  . Mixed hyperlipidemia    Statin intolerant  . Neuropathy     Past Surgical History:   Procedure Laterality Date  . APPENDECTOMY    . CATARACT EXTRACTION    . COLON SURGERY    . TOTAL ABDOMINAL HYSTERECTOMY    . TOTAL ABDOMINAL HYSTERECTOMY W/ BILATERAL SALPINGOOPHORECTOMY      Family History  Problem Relation Age of Onset  . Heart attack Mother   . Heart attack Father   . Hypertension Other     Social History:  reports that she has never smoked. She has never used smokeless tobacco. She reports that she does not drink alcohol or use drugs.  Allergies:  Allergies  Allergen Reactions  . Diazepam Other (See Comments)    Could not walk  . Statins Other (See Comments)    CRAMPING    Medications: Prior to Admission medications   Medication Sig Start Date End Date Taking? Authorizing Provider  acetaminophen (TYLENOL) 500 MG tablet Take 500 mg by mouth every 8 (eight) hours as needed for mild pain or moderate pain.   Yes [provider]  aspirin 81 MG tablet Take 81 mg by mouth daily.   Yes [provider]  atenolol (TENORMIN) 25 MG tablet TAKE ONE TABLET BY MOUTH TWICE DAILY Patient taking differently: Take 25 mg by mouth 2 (two) times daily.  03/08/16  Yes Satira Sark, MD  dextromethorphan-guaiFENesin (TUSSIN DM) 10-100 MG/5ML liquid Take 5 mLs by mouth every 6 (six) hours as needed for cough.   Yes [provider]  docusate sodium (COLACE) 100 MG capsule Take 1 capsule (100 mg total) by mouth daily. 03/07/19  Yes Shah, Pratik D, DO  ferrous sulfate 325 (65 FE) MG tablet Take 325 mg by mouth daily.   Yes [provider]  furosemide (LASIX) 40 MG tablet Take 1 tablet (40 mg total) by mouth daily. 03/06/19 03/05/20 Yes Shah, Pratik D, DO  gabapentin (NEURONTIN) 100 MG capsule Take 1 capsule (100 mg total) by mouth at bedtime. 01/01/14  Yes Angiulli, Lavon Paganini, PA-C  levothyroxine (SYNTHROID) 175 MCG tablet Take 175 mcg by mouth daily. 10/02/19  Yes [provider]  lisinopril (PRINIVIL,ZESTRIL) 5 MG tablet Take 5 mg by mouth  daily.   Yes [provider]  loperamide (IMODIUM A-D) 2 MG tablet Take 2 mg by mouth 4 (four) times daily as needed for diarrhea or loose stools.   Yes [provider]  LORazepam (ATIVAN) 1 MG tablet Take 0.5-1 mg by mouth See admin instructions. Take 0.5mg  by mouth daily in the evening. Take 1mg   At bedtime   Yes [provider]  nitroGLYCERIN (NITROSTAT) 0.4 MG SL tablet Place 1 tablet (0.4 mg total) under the tongue as directed. 06/19/16  Yes Satira Sark, MD  omeprazole (PRILOSEC) 40 MG capsule Take 40 mg by mouth daily.    Yes [provider]  ondansetron (ZOFRAN) 4 MG tablet TAKE 1 TABLET BY MOUTH TWICE DAILY AS NEEDED FOR NAUSEA. Patient taking differently: Take 4 mg by mouth 3 (three) times daily as needed for nausea or vomiting.  12/27/16  Yes Rehman, Mechele Dawley, MD  Oyster Shell (OYSTER CALCIUM) 500 MG TABS tablet  Take 500 mg of elemental calcium by mouth 2 (two) times daily.    Yes [provider]  simethicone (MYLICON) 80 MG chewable tablet Chew 80 mg by mouth 2 (two) times daily.   Yes [provider]  trolamine salicylate (ASPERCREME) 10 % cream Apply 1 application topically 2 (two) times daily as needed for muscle pain.   Yes [provider]  vitamin B-12 (CYANOCOBALAMIN) 500 MCG tablet Take 500 mcg by mouth daily.   Yes [provider]  amoxicillin-clavulanate (AUGMENTIN) 500-125 MG tablet Take 1 tablet by mouth 2 (two) times daily. 10/08/19   [provider]    Scheduled Meds: . aspirin  81 mg Oral Daily  . clopidogrel  75 mg Oral Daily  . enoxaparin (LOVENOX) injection  40 mg Subcutaneous Q24H  . furosemide  40 mg Oral Daily  . levothyroxine  175 mcg Oral Daily  . lisinopril  5 mg Oral Daily  . pantoprazole  40 mg Oral Daily   Continuous Infusions: PRN Meds:.acetaminophen **OR** acetaminophen, HYDROcodone-acetaminophen, ondansetron **OR** ondansetron (ZOFRAN) IV, polyethylene  glycol     Results for orders placed or performed during the hospital encounter of 10/15/19 (from the past 48 hour(s))  CBC with Differential     Status: Abnormal   Collection Time: 10/15/19  4:56 PM  Result Value Ref Range   WBC 6.4 4.0 - 10.5 K/uL   RBC 3.09 (L) 3.87 - 5.11 MIL/uL   Hemoglobin 9.4 (L) 12.0 - 15.0 g/dL   HCT 30.5 (L) 36.0 - 46.0 %   MCV 98.7 80.0 - 100.0 fL   MCH 30.4 26.0 - 34.0 pg   MCHC 30.8 30.0 - 36.0 g/dL   RDW 13.1 11.5 - 15.5 %   Platelets 184 150 - 400 K/uL   nRBC 0.0 0.0 - 0.2 %   Neutrophils Relative % 66 %   Neutro Abs 4.2 1.7 - 7.7 K/uL   Lymphocytes Relative 19 %   Lymphs Abs 1.2 0.7 - 4.0 K/uL   Monocytes Relative 9 %   Monocytes Absolute 0.6 0.1 - 1.0 K/uL   Eosinophils Relative 3 %   Eosinophils Absolute 0.2 0.0 - 0.5 K/uL   Basophils Relative 1 %   Basophils Absolute 0.1 0.0 - 0.1 K/uL   Immature Granulocytes 2 %   Abs Immature Granulocytes 0.15 (H) 0.00 - 0.07 K/uL    Comment: Performed at [facility], [address], [place], [place] [id number]  Basic metabolic panel     Status: Abnormal   Collection Time: 10/15/19  4:56 PM  Result Value Ref Range   Sodium 136 135 - 145 mmol/L   Potassium 5.8 (H) 3.5 - 5.1 mmol/L   Chloride 89 (L) 98 - 111 mmol/L   CO2 38 (H) 22 - 32 mmol/L   Glucose, Bld 100 (H) 70 - 99 mg/dL   BUN 44 (H) 8 - 23 mg/dL   Creatinine, Ser 1.04 (H) 0.44 - 1.00 mg/dL   Calcium 10.0 8.9 - 10.3 mg/dL   GFR calc non Af Amer 46 (L) >60 mL/min   GFR calc Af Amer 54 (L) >60 mL/min   Anion gap 9 5 - 15    Comment: Performed at [facility], [address] [place], [place] [address]  CK     Status: None   Collection Time: 10/15/19  4:56 PM  Result Value Ref Range   Total CK 47 38 - 234 U/L    Comment: Performed at [facility], [address], [place], [place] [address]  Brain natriuretic peptide     Status: Abnormal   Collection Time: 10/15/19  4:56 PM  Result Value Ref Range   B Natriuretic Peptide 260.0 (H) 0.0 - 100.0  pg/mL    Comment: Performed at [facility], [address], [place], [place] [address]  Troponin I (High Sensitivity)     Status: None   Collection Time: 10/15/19  4:56 PM  Result Value Ref Range   Troponin I (High Sensitivity) 7 <18 ng/L    Comment: (NOTE) Elevated high sensitivity troponin I (hsTnI) values and significant  changes across serial measurements may suggest ACS but many other  chronic and acute conditions are known to elevate hsTnI results.  Refer to the "Links" section for chest pain algorithms and additional  guidance. Performed at [facility], [address], [place], [place] [address]   Hemoglobin A1c     Status: None   Collection Time: 10/15/19  4:56 PM  Result Value Ref Range   Hgb A1c MFr Bld 5.6 4.8 - 5.6 %    Comment: (NOTE) Pre diabetes:          5.7%-6.4% Diabetes:              >6.4% Glycemic control for   <7.0% adults with diabetes    Mean Plasma Glucose 114.02 mg/dL    Comment: Performed at [facility] [address] [place], [place] [address]  Troponin I (High Sensitivity)     Status: None   Collection Time: 10/15/19  6:37 PM  Result Value Ref Range   Troponin I (High Sensitivity) 8 <18 ng/L    Comment: (NOTE) Elevated high sensitivity troponin I (hsTnI) values and significant  changes across serial measurements may suggest ACS but many other  chronic and acute conditions are known to elevate hsTnI results.  Refer to the "Links" section for chest pain algorithms and additional  guidance. Performed at [facility], [address] [place], [place] [address]   SARS CORONAVIRUS 2 (TAT 6-24 HRS) Nasopharyngeal Nasopharyngeal Swab     Status: None   Collection Time: 10/15/19  7:40 PM   Specimen: Nasopharyngeal Swab  Result Value Ref Range   SARS Coronavirus 2 NEGATIVE NEGATIVE    Comment: (NOTE) SARS-CoV-2 target nucleic acids are NOT DETECTED. The SARS-CoV-2 RNA is generally detectable in upper and lower respiratory specimens during the  acute phase of infection. Negative results do not preclude SARS-CoV-2 infection, do not rule out co-infections with other pathogens, and should not be used as the sole basis for treatment or other patient management decisions. Negative results must be combined with clinical observations, patient history, and epidemiological information. The expected result is Negative. Fact Sheet for Patients: SugarRoll.be Fact Sheet for Healthcare Providers: https://www.woods-mathews.com/ This test is not yet approved or cleared by the [place] FDA and  has been authorized for detection and/or diagnosis of SARS-CoV-2 by FDA under an Emergency Use Authorization (EUA). This EUA will remain  in effect (meaning this test can be used) for the duration of the COVID-19 declaration under Section 56 4(b)(1) of the Act, 21 U.S.C. section 360bbb-3(b)(1), unless the authorization is terminated or revoked sooner. Performed at [facility], [place] [address], [place], [place] [id number]   Basic metabolic panel     Status: Abnormal   Collection Time: 10/16/19  5:17 AM  Result Value Ref Range   Sodium 136 135 - 145 mmol/L   Potassium 5.1 3.5 - 5.1 mmol/L  Chloride 89 (L) 98 - 111 mmol/L   CO2 36 (H) 22 - 32 mmol/L   Glucose, Bld 95 70 - 99 mg/dL   BUN 42 (H) 8 - 23 mg/dL   Creatinine, Ser 0.93 0.44 - 1.00 mg/dL   Calcium 10.1 8.9 - 10.3 mg/dL   GFR calc non Af Amer 53 (L) >60 mL/min   GFR calc Af Amer >60 >60 mL/min   Anion gap 11 5 - 15    Comment: Performed at [facility], [address], [place], [place] [address]  CBC     Status: Abnormal   Collection Time: 10/16/19  5:17 AM  Result Value Ref Range   WBC 7.0 4.0 - 10.5 K/uL   RBC 3.07 (L) 3.87 - 5.11 MIL/uL   Hemoglobin 9.2 (L) 12.0 - 15.0 g/dL   HCT 29.7 (L) 36.0 - 46.0 %   MCV 96.7 80.0 - 100.0 fL   MCH 30.0 26.0 - 34.0 pg   MCHC 31.0 30.0 - 36.0 g/dL   RDW 13.0 11.5 - 15.5 %   Platelets 198 150 -  400 K/uL   nRBC 0.0 0.0 - 0.2 %    Comment: Performed at [facility], [address] [place], [place] [address]  Lipid panel     Status: Abnormal   Collection Time: 10/16/19  5:17 AM  Result Value Ref Range   Cholesterol 177 0 - 200 mg/dL   Triglycerides 137 <150 mg/dL   HDL 45 >40 mg/dL   Total CHOL/HDL Ratio 3.9 RATIO   VLDL 27 0 - 40 mg/dL   LDL Cholesterol 105 (H) 0 - 99 mg/dL    Comment:        Total Cholesterol/HDL:CHD Risk Coronary Heart Disease Risk Table                     Men   Women  1/2 Average Risk   3.4   3.3  Average Risk       5.0   4.4  2 X Average Risk   9.6   7.1  3 X Average Risk  23.4   11.0        Use the calculated Patient Ratio above and the CHD Risk Table to determine the patient's CHD Risk.        ATP III CLASSIFICATION (LDL):  <100     mg/dL   Optimal  100-129  mg/dL   Near or Above                    Optimal  130-159  mg/dL   Borderline  160-189  mg/dL   High  >190     mg/dL   Very High Performed at [facility] [place], [place] [address]     Studies/Results:  BRAIN MRI FINDINGS: MRI HEAD FINDINGS  The study is limited and abbreviated by patient motion. Brain: There is acute/subacute infarction affecting the left temporal lobe, with minor involvement of the insular brain. The study is markedly degraded by motion, but I do not have significant suspicion regarding tumor or cerebritis. No gross hemorrhage. No significant mass effect or shift. Elsewhere, there chronic small-vessel ischemic changes of the white matter.  Vascular: No vascular information available.  Skull and upper cervical spine: No abnormality seen.  Sinuses/Orbits: Negative  Other: None  IMPRESSION: Acute to early subacute infarction affecting the left temporal lobe, with minor involvement of the adjacent insular brain. The study is limited and  abbreviated by patient motion. I do not have any significant suspicion regarding tumor or  cerebritis.    BRAIN MRA  FINDINGS: The study is limited by motion degradation. Both internal carotid arteries are patent through the skull base and siphon regions. There is flow in the anterior and middle cerebral arteries. Distal vessels cannot be evaluated.  Both vertebral arteries are patent to the basilar. The basilar artery shows flow. Both superior cerebellar arteries and posterior cerebral arteries show flow.  IMPRESSION: Limited and motion degraded exam. Flow within the major vessels at the base of the brain. Distal vessel information not reliable because of pronounced motion.    TTE  1. Left ventricular ejection fraction, by estimation, is 55 to 60%. The  left ventricle has normal function. The left ventricle has no regional  wall motion abnormalities. There is mild left ventricular hypertrophy.  Left ventricular diastolic parameters  are consistent with Grade I diastolic dysfunction (impaired relaxation).  2. Right ventricular systolic function is normal. The right ventricular  size is normal. There is moderately elevated pulmonary artery systolic  pressure. The estimated right ventricular systolic pressure is [id number] mmHg.  3. Left atrial size was mildly dilated.  4. The mitral valve is grossly normal. Mild mitral valve regurgitation.  5. Tricuspid valve regurgitation is mild to moderate.  6. The aortic valve is tricuspid. Aortic valve regurgitation is trivial.  7. The inferior vena cava is normal in size with greater than 50%  respiratory variability, suggesting right atrial pressure of 3 mmHg.    The brain MRI is reviewed in person. There is a homogeneously bright signal seen on DWI involving most of the temple region on the left side. This is associated with reduced signal on the ADC scan. The may be petechial hemorrhages noted. There is appropriate mild-to-moderate global atrophy. There is mild deep white matter and periventricular  leukoencephalopathy consistent with chronic microvascular ischemic changes. MRA shows significant motion artifact which limits the findings. There is possible reduced signal in the MCA is. The left vertebral is hypoplastic.  Lisa Kemp A. Merlene Laughter, M.D.  Diplomate, Tax adviser of Psychiatry and Neurology ( Neurology). 10/16/2019, 4:31 PM

## 2019-10-16 NOTE — Progress Notes (Addendum)
Patient Demographics:    Lisa Kemp, is a 86 y.o. adult, DOB - March 23, 1924, [id number]  Admit date - 10/15/2019   Admitting Physician Lisa Arlyce Dice, MD  Outpatient Primary MD for the patient is Lisa Chroman, MD  LOS - 1  Chief Complaint  Patient presents with  . Fall        Subjective:    Lisa Kemp today has no fevers, no emesis,  No chest pain,   -Patient is confused, disoriented,   Assessment  & Plan :    Principal Problem:   Acute CVA (cerebrovascular accident)/Lt temporal lobe AND Insula Active Problems:   Acute metabolic encephalopathy   Dementia (HCC)   Unspecified atrial fibrillation (HCC)   Essential hypertension, benign   H/o SDH (subdural hematoma) -2015   Hypothyroidism   Brief Summary:- 86 y.o.female with medical history significant for dCHF, CAD, H/o prior SDH , HTN, dementia and  chronic respiratory failure on 3 L at baseline admitted on 10/15/2019 [facility] after a fall, found to have acute CVA-remains confused  A/p 1) acute CVA of left temporal lobe and insular --cranial imaging studies including CT head, MRI brain, MRA head noted -A1c 5.6,  -Echo with EF of 55 to 60% with grade 1 diastolic dysfunction -No regional wall motion abnormalities -PTA patient was on aspirin 81 mg daily, treat empirically with aspirin and Plavix combo for 3 weeks then transition to Plavix monotherapy after 3 weeks  -History of statin intolerance, LDL 105, treat empirically with Lipitor 20 mg daily -PT and speech therapy eval appreciated recommends SNF rehab  2) acute metabolic encephalopathy superimposed on underlying dementia--- most likely due to acute stroke as above #1, apparently recently treated for UTI with Augmentin, --check UA to rule out concomitant UTI as a contributing factor -At baseline apparently patient has difficulties with mobility related ADLs,  gets around with wheelchair at ALF - 3)HTN--allow permissive hypertension in the setting of acute stroke, hold lisinopril and Lasix until 10/18/2019  4) hypothyroidism--- continue levothyroxine 175 mcg daily, recheck TSH in a.m.  5) status post fall--- may be secondary to #1 above, PT eval appreciated, no acute fractures on imaging studies, recommends SNF rehab  6) hyperkalemia--- most likely due to concomitant lisinopril use as well as potassium replacements PTA -Lisinopril and potassium has been discontinued, repeat potassium down to 5.1 from 5.8, creatinine is 0.93  7)New onset atrial fibrillation --- most likely paroxysmal A. Fib -TSH pending, potassium has normalized, echo with EF of 55 to 60% HASBLED Score = 4  CHA2DS2- VASc score   is = 7    Which is  equal to = 11.2% annual risk of stroke  --Discussed risk Versus benefit of FULL anticoagulation with patient's sister [name]-- [phone] sister declined for anticoagulation given increased's HASBLED score and history of falls -Continue antiplatelet therapy as above #1  8) chronic anemia--- suspect partly nutritional, hemoglobin currently 9.2, her baseline is around 10, no evidence of ongoing bleeding -Monitor closely for possible bleeding especially with DAPT  Disposition/Need for in-Hospital Stay- patient unable to be discharged at this time due to --- acute CVA, awaiting transfer to SNF rehab  Code Status : DNR  Family Communication:  Discussed with Ms. Lisa Frohlich  Kemp-- [phone] sister   Consults  :  na  DVT Prophylaxis  :  Lovenox -   SCDs   Lab Results  Component Value Date   PLT 198 10/16/2019    Inpatient Medications  Scheduled Meds: . aspirin  81 mg Oral Daily  . clopidogrel  75 mg Oral Daily  . enoxaparin (LOVENOX) injection  40 mg Subcutaneous Q24H  . furosemide  40 mg Oral Daily  . levothyroxine  175 mcg Oral Daily  . lisinopril  5 mg Oral Daily  . pantoprazole  40 mg  Oral Daily   Continuous Infusions: PRN Meds:.acetaminophen **OR** acetaminophen, HYDROcodone-acetaminophen, ondansetron **OR** ondansetron (ZOFRAN) IV, polyethylene glycol   Anti-infectives (From admission, onward)   None        Objective:   Vitals:   10/16/19 0731 10/16/19 0930 10/16/19 1330 10/16/19 1730  BP: (!) 119/55 (!) 159/132 (!) 161/73 (!) 160/65  Pulse: 82 99 81 84  Resp: 18 18 18 18   Temp: 98 F (36.7 C) 98.3 F (36.8 C) 98.2 F (36.8 C) 97.7 F (36.5 C)  TempSrc: Oral Oral Oral Oral  SpO2: 98% 98% 93% 98%  Weight:      Height:        Wt Readings from Last 3 Encounters:  10/15/19 74 kg  03/06/19 76.9 kg  02/12/19 67.5 kg     Intake/Output Summary (Last 24 hours) at 10/16/2019 1825 Last data filed at 10/16/2019 0900 Gross per 24 hour  Intake 0 ml  Output --  Net 0 ml     Physical Exam  Gen:- Awake Alert, resting comfortably HEENT:- [place].AT, No sclera icterus Neck-Supple Neck,No JVD,.  Lungs-  CTAB , fair symmetrical air movement CV- S1, S2 normal, irregular  abd-  +ve B.Sounds, Abd Soft, No tenderness,    Extremity/Skin:- No  edema, pedal pulses present  Psych-confused, disoriented, does not follow commands appropriately  neuro-generalized weakness, no new focal deficits, no tremors   Data Review:   Micro Results Recent Results (from the past 240 hour(s))  SARS CORONAVIRUS 2 (TAT 6-24 HRS) Nasopharyngeal Nasopharyngeal Swab     Status: None   Collection Time: 10/15/19  7:40 PM   Specimen: Nasopharyngeal Swab  Result Value Ref Range Status   SARS Coronavirus 2 NEGATIVE NEGATIVE Final    Comment: (NOTE) SARS-CoV-2 target nucleic acids are NOT DETECTED. The SARS-CoV-2 RNA is generally detectable in upper and lower respiratory specimens during the acute phase of infection. Negative results do not preclude SARS-CoV-2 infection, do not rule out co-infections with other pathogens, and should not be used as the sole basis for treatment or other  patient management decisions. Negative results must be combined with clinical observations, patient history, and epidemiological information. The expected result is Negative. Fact Sheet for Patients: SugarRoll.be Fact Sheet for Healthcare Providers: https://www.woods-mathews.com/ This test is not yet approved or cleared by the [place] FDA and  has been authorized for detection and/or diagnosis of SARS-CoV-2 by FDA under an Emergency Use Authorization (EUA). This EUA will remain  in effect (meaning this test can be used) for the duration of the COVID-19 declaration under Section 56 4(b)(1) of the Act, 21 U.S.C. section 360bbb-3(b)(1), unless the authorization is terminated or revoked sooner. Performed at [facility], [place] [address] [place], [place] [address]     Radiology Reports DG Chest 1 View  Result Date: 10/15/2019 CLINICAL DATA:  86 year old female with fall.  Shortness of breath. EXAM: CHEST  1 VIEW COMPARISON:  Chest  radiograph dated 03/04/2019. FINDINGS: A crescentic airspace opacity at the medial right lung base, likely atelectasis secondary to a moderate-sized hiatal hernia. The lungs are otherwise clear. There is no pleural effusion or pneumothorax. Mild cardiomegaly. Atherosclerotic calcification of the aorta. No acute osseous pathology. Osteopenia. IMPRESSION: Probable area of atelectasis in the right infrahilar region secondary to a moderate size hiatal hernia. No definite acute cardiopulmonary process. Electronically Signed   By: Anner Crete M.D.   On: 10/15/2019 18:01   DG Pelvis 1-2 Views  Result Date: 10/15/2019 CLINICAL DATA:  86 year old female with fall. EXAM: PELVIS - 1-2 VIEW COMPARISON:  None. FINDINGS: There is no acute fracture or dislocation. The bones are osteopenic. There is degenerative changes of the visualized lower lumbar spine. Multiple surgical clips noted over the pelvis. The soft tissues are  unremarkable. Small radiopaque foci over the lower abdomen bilaterally may represent fecalith versus renal calculi. Vascular calcifications noted. IMPRESSION: No acute fracture or dislocation. Electronically Signed   By: Anner Crete M.D.   On: 10/15/2019 18:03   DG Tibia/Fibula Right  Result Date: 10/15/2019 CLINICAL DATA:  86 year old female status post fall with right lower leg skin tear. EXAM: RIGHT TIBIA AND FIBULA - 2 VIEW COMPARISON:  None. FINDINGS: Calcified peripheral vascular disease. Generalized soft tissue stranding. No soft tissue gas identified. No radiopaque foreign body identified. No acute osseous abnormality identified. Severe lateral compartment joint degeneration at the right knee. IMPRESSION: 1. No acute osseous abnormality identified about the right tib-fib. Severe lateral compartment right knee joint degeneration. 2. Calcified peripheral vascular disease. Generalized soft tissue stranding. Electronically Signed   By: Genevie Ann M.D.   On: 10/15/2019 18:03   CT Head Wo Contrast  Result Date: 10/15/2019 CLINICAL DATA:  Head trauma, headache. Additional history provided: Unwitnessed fall, increased confusion this week, recent diagnosis of UTI, history of hypertension, dementia, intracranial hemorrhage EXAM: CT HEAD WITHOUT CONTRAST TECHNIQUE: Contiguous axial images were obtained from the base of the skull through the vertex without intravenous contrast. COMPARISON:  Head CT 03/22/2015. FINDINGS: Brain: There is no evidence of acute intracranial hemorrhage. No midline shift or extra-axial fluid collection. There is a region of abnormal hypodensity within the left temporal lobe measuring 6.2 x 2.0 x 1.8 cm (AP x TV x CC) (series 2, image 15) (series 4, image 48). This abnormal hypodensity appears to predominantly affect the white matter. Mild for age ill-defined hypoattenuation within the remainder of the cerebral white matter is nonspecific, but consistent with chronic small vessel  ischemic disease. Mild generalized parenchymal atrophy. Redemonstrated nonspecific irregular dural-based calcifications along right frontal convexity. Vascular: No hyperdense vessel. Atherosclerotic calcifications. Skull: No calvarial fracture or aggressive osseous lesion. Sinuses/Orbits: Visualized orbits demonstrate no acute abnormality. No significant paranasal sinus disease or mastoid effusion. Other: Right parietal scalp soft tissue swelling These results were called by telephone at the time of interpretation on 10/15/2019 at 5:51 pm to provider [facility] , who verbally acknowledged these results. IMPRESSION: 6.2 cm region of abnormal hypodensity within the left temporal lobe predominantly affecting the white matter. This finding is new from CT 03/22/2015 and indeterminate. Primary differential considerations include edema related to a primary or metastatic neoplasm versus an infectious/inflammatory process. Recent ischemic infarction is also a consideration, although this would be an atypical appearance. Contrast-enhanced brain MRI is recommended for further evaluation. Background mild generalized parenchymal atrophy and chronic small vessel ischemic disease. No evidence of acute intracranial hemorrhage. Right parietal scalp soft tissue swelling. Electronically Signed   By:  Kellie Simmering DO   On: 10/15/2019 17:51   MR ANGIO HEAD WO CONTRAST  Result Date: 10/15/2019 CLINICAL DATA:  [place].  Left temporal abnormality. EXAM: MRA HEAD WITHOUT CONTRAST TECHNIQUE: Angiographic images of the Circle of Willis were obtained using MRA technique without intravenous contrast. COMPARISON:  Brain study same day FINDINGS: The study is limited by motion degradation. Both internal carotid arteries are patent through the skull base and siphon regions. There is flow in the anterior and middle cerebral arteries. Distal vessels cannot be evaluated. Both vertebral arteries are patent to the basilar. The basilar artery shows  flow. Both superior cerebellar arteries and posterior cerebral arteries show flow. IMPRESSION: Limited and motion degraded exam. Flow within the major vessels at the base of the brain. Distal vessel information not reliable because of pronounced motion. Electronically Signed   By: Nelson Chimes M.D.   On: 10/15/2019 19:20   MR BRAIN WO CONTRAST  Result Date: 10/15/2019 CLINICAL DATA:  Encephalopathy. Unwitnessed fall with trauma to the head. EXAM: MRI HEAD WITHOUT CONTRAST TECHNIQUE: Multiplanar, multiecho pulse sequences of the brain and surrounding structures were obtained without intravenous contrast. COMPARISON:  Head CT same day.  Head CT 03/22/2015. FINDINGS: MRI HEAD FINDINGS The study is limited and abbreviated by patient motion. Brain: There is acute/subacute infarction affecting the left temporal lobe, with minor involvement of the insular brain. The study is markedly degraded by motion, but I do not have significant suspicion regarding tumor or cerebritis. No gross hemorrhage. No significant mass effect or shift. Elsewhere, there chronic small-vessel ischemic changes of the white matter. Vascular: No vascular information available. Skull and upper cervical spine: No abnormality seen. Sinuses/Orbits: Negative Other: None IMPRESSION: Acute to early subacute infarction affecting the left temporal lobe, with minor involvement of the adjacent insular brain. The study is limited and abbreviated by patient motion. I do not have any significant suspicion regarding tumor or cerebritis. Electronically Signed   By: Nelson Chimes M.D.   On: 10/15/2019 19:17   ECHOCARDIOGRAM COMPLETE  Result Date: 10/16/2019    ECHOCARDIOGRAM REPORT   Patient Name:   SAUDA STILSON Date of Exam: 10/16/2019 Medical Rec #:  [id number]           Height:       65.0 in Accession #:    [id number]          Weight:       163.1 lb Date of Birth:  March 14, 1924           BSA:          1.81 m Patient Age:    64 years            BP:            119/55 mmHg Patient Gender: F                   HR:           82 bpm. Exam Location:  [facility] Procedure: 2D Echo Indications:    Abnormal ECG 794.31 / R94.31  History:        Patient has prior history of Echocardiogram examinations, most                 recent 03/05/2019. CHF, CAD, Stroke, Arrythmias:Atrial                 Fibrillation; Risk Factors:Hypertension, Non-Smoker and  Dyslipidemia. GERD, Elevated troponin.  Sonographer:    Leavy Cella RDCS (AE) Referring Phys: [place]  1. Left ventricular ejection fraction, by estimation, is 55 to 60%. The left ventricle has normal function. The left ventricle has no regional wall motion abnormalities. There is mild left ventricular hypertrophy. Left ventricular diastolic parameters are consistent with Grade I diastolic dysfunction (impaired relaxation).  2. Right ventricular systolic function is normal. The right ventricular size is normal. There is moderately elevated pulmonary artery systolic pressure. The estimated right ventricular systolic pressure is Q000111Q mmHg.  3. Left atrial size was mildly dilated.  4. The mitral valve is grossly normal. Mild mitral valve regurgitation.  5. Tricuspid valve regurgitation is mild to moderate.  6. The aortic valve is tricuspid. Aortic valve regurgitation is trivial.  7. The inferior vena cava is normal in size with greater than 50% respiratory variability, suggesting right atrial pressure of 3 mmHg. FINDINGS  Left Ventricle: Left ventricular ejection fraction, by estimation, is 55 to 60%. The left ventricle has normal function. The left ventricle has no regional wall motion abnormalities. The left ventricular internal cavity size was normal in size. There is  mild left ventricular hypertrophy. Left ventricular diastolic parameters are consistent with Grade I diastolic dysfunction (impaired relaxation). Right Ventricle: The right ventricular size is normal. No increase in right  ventricular wall thickness. Right ventricular systolic function is normal. There is moderately elevated pulmonary artery systolic pressure. The tricuspid regurgitant velocity is 3.41 m/s, and with an assumed right atrial pressure of 3 mmHg, the estimated right ventricular systolic pressure is Q000111Q mmHg. Left Atrium: Left atrial size was mildly dilated. Right Atrium: Right atrial size was normal in size. Pericardium: There is no evidence of pericardial effusion. Mitral Valve: The mitral valve is grossly normal. Mild mitral valve regurgitation. Tricuspid Valve: The tricuspid valve is grossly normal. Tricuspid valve regurgitation is mild to moderate. Aortic Valve: The aortic valve is tricuspid. Aortic valve regurgitation is trivial. Aortic regurgitation PHT measures 297 msec. Moderate aortic valve annular calcification. There is mild calcification of the aortic valve. Pulmonic Valve: The pulmonic valve was grossly normal. Pulmonic valve regurgitation is not visualized. Aorta: The aortic root is normal in size and structure. Venous: The inferior vena cava is normal in size with greater than 50% respiratory variability, suggesting right atrial pressure of 3 mmHg. IAS/Shunts: No atrial level shunt detected by color flow Doppler.  LEFT VENTRICLE PLAX 2D LVIDd:         4.11 cm  Diastology LVIDs:         3.13 cm  LV e' lateral:   5.87 cm/s LV PW:         1.10 cm  LV E/e' lateral: 19.4 LV IVS:        1.33 cm  LV e' medial:    10.10 cm/s LVOT diam:     2.00 cm  LV E/e' medial:  11.3 LV SV Index:   19.27 LVOT Area:     3.14 cm  RIGHT VENTRICLE RV S prime:     8.81 cm/s TAPSE (M-mode): 2.6 cm LEFT ATRIUM             Index       RIGHT ATRIUM           Index LA diam:        3.70 cm 2.04 cm/m  RA Area:     14.20 cm LA Vol (A2C):   82.8 ml 45.64 ml/m RA Volume:  34.10 ml  18.80 ml/m LA Vol (A4C):   54.0 ml 29.77 ml/m LA Biplane Vol: 68.9 ml 37.98 ml/m  AORTIC VALVE AI PHT:      297 msec  AORTA Ao Root diam: 3.00 cm MITRAL  VALVE                TRICUSPID VALVE MV Area (PHT): 5.13 cm     TR Peak grad:   46.5 mmHg MV Decel Time: 148 msec     TR Vmax:        341.00 cm/s MR Peak grad: 130.0 mmHg MR Mean grad: 95.0 mmHg     SHUNTS MR Vmax:      570.00 cm/s   Systemic Diam: 2.00 cm MR Vmean:     463.0 cm/s MV E velocity: 114.00 cm/s MV A velocity: 117.00 cm/s MV E/A ratio:  0.97 Rozann Lesches MD Electronically signed by Rozann Lesches MD Signature Date/Time: 10/16/2019/11:40:24 AM    Final      CBC Recent Labs  Lab 10/15/19 1656 10/16/19 0517  WBC 6.4 7.0  HGB 9.4* 9.2*  HCT 30.5* 29.7*  PLT 184 198  MCV 98.7 96.7  MCH 30.4 30.0  MCHC 30.8 31.0  RDW 13.1 13.0  LYMPHSABS 1.2  --   MONOABS 0.6  --   EOSABS 0.2  --   BASOSABS 0.1  --     Chemistries  Recent Labs  Lab 10/15/19 1656 10/16/19 0517  NA 136 136  K 5.8* 5.1  CL 89* 89*  CO2 38* 36*  GLUCOSE 100* 95  BUN 44* 42*  CREATININE 1.04* 0.93  CALCIUM 10.0 10.1   ------------------------------------------------------------------------------------------------------------------ Recent Labs    10/16/19 0517  CHOL 177  HDL 45  LDLCALC 105*  TRIG 137  CHOLHDL 3.9    Lab Results  Component Value Date   HGBA1C 5.6 10/15/2019   ------------------------------------------------------------------------------------------------------------------ No results for input(s): TSH, T4TOTAL, T3FREE, THYROIDAB in the last 72 hours.  Invalid input(s): FREET3 ------------------------------------------------------------------------------------------------------------------ No results for input(s): VITAMINB12, FOLATE, FERRITIN, TIBC, IRON, RETICCTPCT in the last 72 hours.  Coagulation profile No results for input(s): INR, PROTIME in the last 168 hours.  No results for input(s): DDIMER in the last 72 hours.  Cardiac Enzymes No results for input(s): CKMB, TROPONINI, MYOGLOBIN in the last 168 hours.  Invalid input(s):  CK ------------------------------------------------------------------------------------------------------------------    Component Value Date/Time   BNP 260.0 (H) 10/15/2019 1656     Roxan Hockey M.D on 10/16/2019 at 6:25 PM  Go to [email or web address] - for contact info  [facility] - Office  [phone]

## 2019-10-16 NOTE — Evaluation (Signed)
Clinical/Bedside Swallow Evaluation Patient Details  Name: Lisa Kemp MRN: [id number] Date of Birth: [birth year]  Today's Date: 10/16/2019 Time: SLP Start Time (ACUTE ONLY): [id number] SLP Stop Time (ACUTE ONLY): 1055 SLP Time Calculation (min) (ACUTE ONLY): 20 min  Past Medical History:  Past Medical History:  Diagnosis Date  . Arthritis   . CHF (congestive heart failure) ([place])   . Colon polyps   . Coronary atherosclerosis of native coronary artery    Nonobstructive at catherization 2006  . Dementia ([place])   . Esophageal ring    Distal esophageal ring status post dilation  . Gastrointestinal bleed    Related to NSAIDs  . GERD (gastroesophageal reflux disease)   . Hiatal hernia    Moderate sized sliding hiatal hernia  . Hypertension   . Hypothyroidism   . Intracranial hemorrhage ([place])    Left subcortical 2015  . Mixed hyperlipidemia    Statin intolerant  . Neuropathy    Past Surgical History:  Past Surgical History:  Procedure Laterality Date  . APPENDECTOMY    . CATARACT EXTRACTION    . COLON SURGERY    . TOTAL ABDOMINAL HYSTERECTOMY    . TOTAL ABDOMINAL HYSTERECTOMY W/ BILATERAL SALPINGOOPHORECTOMY     HPI:  Lisa Kemp is a 86 y.o. adult with medical history significant for diastolic CHF, coronary artery disease, subdural hematoma, hypertension and dementia, with chronic respiratory failure on 3 L at baseline. History is obtained from nursing home and EDP, as at the time of my evaluation, the patient is awake and alert, she is not answering questions, but has incoherently. Patient was brought to the ED with reports of a fall in her bathroom at her ALF.  Nursing staff reported increasing confusion, and the patient has been diagnosed with UTI this week and is currently on Augmentin, last dose to be taken this evening.  Patient sustained a large skin tear to her right lateral lower leg.   Assessment / Plan / Recommendation Clinical Impression  Clinical swallowing  evaluation complete while Pt was sitting upright in bed; Pt was confused and asking to "please go home". SLP provided regular, thin and puree trials to Pt and no overt s/sx of aspiration were observed with any textures/consistencies. Pt was pleasantly confused throughout session; acknowledge SLE order. There are no further ST needs for dysphagia at this time, Note to follow for SLE later today. SLP Visit Diagnosis: Dysphagia, unspecified (R13.10)    Aspiration Risk  Mild aspiration risk    Diet Recommendation Regular;Thin liquid   Liquid Administration via: Straw Medication Administration: Whole meds with liquid Supervision: Full supervision/cueing for compensatory strategies Compensations: Minimize environmental distractions;Slow rate;Small sips/bites Postural Changes: Seated upright at 90 degrees;Remain upright for at least 30 minutes after po intake    Other  Recommendations Oral Care Recommendations: Oral care BID   Follow up Recommendations None        Swallow Study   General Date of Onset: 10/15/19 HPI: Lisa Kemp is a 86 y.o. adult with medical history significant for diastolic CHF, coronary artery disease, subdural hematoma, hypertension and dementia, with chronic respiratory failure on 3 L at baseline. History is obtained from nursing home and EDP, as at the time of my evaluation, the patient is awake and alert, she is not answering questions, but has incoherently. Patient was brought to the ED with reports of a fall in her bathroom at her ALF.  Nursing staff reported increasing confusion, and the patient has been diagnosed with UTI  this week and is currently on Augmentin, last dose to be taken this evening.  Patient sustained a large skin tear to her right lateral lower leg. Type of Study: Bedside Swallow Evaluation Previous Swallow Assessment: none in chart Diet Prior to this Study: Regular;Thin liquids Temperature Spikes Noted: No Respiratory Status: Nasal  cannula History of Recent Intubation: No Behavior/Cognition: Alert;Cooperative;Pleasant mood;Confused;Requires cueing;Distractible Oral Cavity Assessment: Within Functional Limits Oral Care Completed by SLP: Recent completion by staff Oral Cavity - Dentition: Missing dentition Self-Feeding Abilities: Needs assist;Needs set up;Total assist Patient Positioning: Upright in bed Baseline Vocal Quality: Normal Volitional Cough: Strong Volitional Swallow: Able to elicit    Oral/Motor/Sensory Function Overall Oral Motor/Sensory Function: Within functional limits   Ice Chips Ice chips: Within functional limits   Thin Liquid Thin Liquid: Within functional limits    Nectar Thick Nectar Thick Liquid: Not tested   Honey Thick Honey Thick Liquid: Not tested   Puree Puree: Within functional limits   Solid     Solid: Within functional limits     Nylan Nakatani H. Roddie Mc, CCC-SLP Speech Language Pathologist  Wende Bushy 10/16/2019,11:09 AM

## 2019-10-16 NOTE — Evaluation (Signed)
Physical Therapy Evaluation Patient Details Name: Lisa Kemp MRN: [id number] DOB: June 18, 1924 Today's Date: 10/16/2019   History of Present Illness  Lisa Kemp is a 86 y.o. adult with medical history significant for diastolic CHF, coronary artery disease, subdural hematoma, hypertension and dementia, with chronic respiratory failure on 3 L at baseline.History is obtained from nursing home and EDP, as at the time of my evaluation, the patient is awake and alert, she is not answering questions, but has incoherently.Patient was brought to the ED with reports of a fall in her bathroom at her ALF.  Nursing staff reported increasing confusion, and the patient has been diagnosed with UTI this week and is currently on Augmentin, last dose to be taken this evening.  Patient sustained a large skin tear to her right lateral lower leg.     Clinical Impression  Patient limited for functional mobility as stated below secondary to BLE weakness, fatigue and poor standing balance. Patient demonstrates limited carry over with verbal cueing but is able to transition to seated EOB with min assist and frequent verbal cueing. She transitions to stand with use of RW and assist but requires assist for balance and to remain standing. She is limited by fatigue and impaired activity tolerance. Patient uses nonsensical phrasing not related to topic/question throughout session. Patient will benefit from continued physical therapy in hospital and recommended venue below to increase strength, balance, endurance for safe ADLs and gait.     Follow Up Recommendations SNF    Equipment Recommendations  None recommended by PT    Recommendations for Other Services       Precautions / Restrictions Precautions Precautions: Fall Restrictions Weight Bearing Restrictions: No      Mobility  Bed Mobility Overal bed mobility: Needs Assistance Bed Mobility: Supine to Sit;Sit to Supine     Supine to sit: Min  assist;HOB elevated Sit to supine: Min assist;HOB elevated   General bed mobility comments: verbal cueing and assist to transition to seated EOB  Transfers Overall transfer level: Needs assistance Equipment used: Rolling walker (2 wheeled) Transfers: Sit to/from Stand Sit to Stand: Min assist         General transfer comment: able to stand with RW and assist, she fatigues quickly with standing  Ambulation/Gait                Stairs            Wheelchair Mobility    Modified Rankin (Stroke Patients Only)       Balance Overall balance assessment: Needs assistance Sitting-balance support: Feet supported;Bilateral upper extremity supported Sitting balance-Leahy Scale: Good Sitting balance - Comments: seated EOB     Standing balance-Leahy Scale: Poor Standing balance comment: using RW                             Pertinent Vitals/Pain Pain Assessment: No/denies pain    Home Living Family/patient expects to be discharged to:: Other (Comment)(ALF)                 Additional Comments: history obtained from chart review    Prior Function Level of Independence: Needs assistance   Gait / Transfers Assistance Needed: uses wheelchair  ADL's / Homemaking Assistance Needed: assisted by ALF staff  Comments: patient unable to provide history     Hand Dominance        Extremity/Trunk Assessment   Upper Extremity Assessment Upper Extremity Assessment: Overall [facility]  for tasks assessed    Lower Extremity Assessment Lower Extremity Assessment: Generalized weakness    Cervical / Trunk Assessment Cervical / Trunk Assessment: Kyphotic  Communication   Communication: Receptive difficulties;Expressive difficulties;HOH  Cognition Arousal/Alertness: Awake/alert Behavior During Therapy: Flat affect;Impulsive;Restless;Agitated Overall Cognitive Status: Impaired/Different from baseline                                 General  Comments: Patient appears to be confused and cognition is impaired, she is alert but not orientated; limited carry over with cueing      General Comments      Exercises     Assessment/Plan    PT Assessment Patient needs continued PT services  PT Problem List Decreased strength;Decreased mobility;Decreased safety awareness;Decreased activity tolerance;Decreased balance;Decreased knowledge of use of DME       PT Treatment Interventions DME instruction;Gait training;Therapeutic exercise;Balance training;Neuromuscular re-education;Functional mobility training;Wheelchair mobility training;Therapeutic activities;Patient/family education    PT Goals (Current goals can be found in the Care Plan section)  Acute Rehab PT Goals Patient Stated Goal: go home PT Goal Formulation: With patient Time For Goal Achievement: 10/30/19 Potential to Achieve Goals: Fair    Frequency Min 3X/week   Barriers to discharge        Co-evaluation               AM-PAC PT "6 Clicks" Mobility  Outcome Measure Help needed turning from your back to your side while in a flat bed without using bedrails?: A Little Help needed moving from lying on your back to sitting on the side of a flat bed without using bedrails?: A Little Help needed moving to and from a bed to a chair (including a wheelchair)?: A Lot Help needed standing up from a chair using your arms (e.g., wheelchair or bedside chair)?: A Lot Help needed to walk in hospital room?: A Lot Help needed climbing 3-5 steps with a railing? : Total 6 Click Score: 13    End of Session Equipment Utilized During Treatment: Gait belt;Oxygen Activity Tolerance: Other (comment);Patient limited by fatigue(limited by confusion and limited ability to follow commands) Patient left: in bed;with nursing/sitter in room;with call bell/phone within reach;with bed alarm set Nurse Communication: Mobility status PT Visit Diagnosis: Unsteadiness on feet (R26.81);Other  abnormalities of gait and mobility (R26.89);Muscle weakness (generalized) (M62.81)    Time: [id number] PT Time Calculation (min) (ACUTE ONLY): 18 min   Charges:   PT Evaluation $PT Eval Moderate Complexity: 1 Mod PT Treatments $Therapeutic Activity: 8-22 mins       2:30 PM, 10/16/19 Mearl Latin PT, DPT Physical Therapist at [facility]

## 2019-10-16 NOTE — Plan of Care (Signed)
  Problem: SLP Language Goals Goal: Patient will communicate needs/wants with Flowsheets (Taken 10/16/2019 1534) Patient will communicate ____  needs/wants with: mod assist Goal: Patient will follow commands during a functional ADL with Flowsheets (Taken 10/16/2019 1534) Patient will follow ____ commands during a functional ADL with ____: mod assist

## 2019-10-16 NOTE — Progress Notes (Signed)
OT Cancellation Note  Patient Details Name: Lisa Kemp MRN: [id number] DOB: [birth year]   Cancelled Treatment:    Reason Eval/Treat Not Completed: Patient's level of consciousness;Other (comment). Attempted to see pt this am, pt asleep with mitts on, not responding to name. RN came by room and requested we wait until later to see pt as she has been agitated overnight. Will attempt evaluation at a later time.   Guadelupe Sabin, OTR/L  [phone] 10/16/2019, 8:10 AM

## 2019-10-16 NOTE — Evaluation (Signed)
Speech Language Pathology Evaluation Patient Details Name: Lisa Kemp MRN: [id number] DOB: 1924-08-14 Today's Date: 10/16/2019 Time: [id number] SLP Time Calculation (min) (ACUTE ONLY): 30 min  Problem List:  Patient Active Problem List   Diagnosis Date Noted  . Acute CVA (cerebrovascular accident) ([place]) 10/15/2019  . Unspecified atrial fibrillation ([place]) 10/15/2019  . Acute diastolic (congestive) heart failure ([place]) 03/05/2019  . Acute CHF (congestive heart failure) ([place]) 03/04/2019  . Hyponatremia 09/12/2018  . HCAP (healthcare-associated pneumonia) 09/11/2018  . Hypothyroidism 09/11/2018  . GERD (gastroesophageal reflux disease) 09/11/2018  . Dementia ([place]) 09/11/2018  . Elevated troponin 09/11/2018  . Carotid atherosclerosis 06/11/2014  . Osteoarthritis of left knee 02/17/2014  . SDH (subdural hematoma) ([place]) 12/25/2013  . Intracerebral hemorrhage ([place]) 12/22/2013  . Essential hypertension, benign 08/01/2009  . Mixed hyperlipidemia 06/12/2009  . CAD, NATIVE VESSEL 06/12/2009   Past Medical History:  Past Medical History:  Diagnosis Date  . Arthritis   . CHF (congestive heart failure) ([place])   . Colon polyps   . Coronary atherosclerosis of native coronary artery    Nonobstructive at catherization 2006  . Dementia ([place])   . Esophageal ring    Distal esophageal ring status post dilation  . Gastrointestinal bleed    Related to NSAIDs  . GERD (gastroesophageal reflux disease)   . Hiatal hernia    Moderate sized sliding hiatal hernia  . Hypertension   . Hypothyroidism   . Intracranial hemorrhage ([place])    Left subcortical 2015  . Mixed hyperlipidemia    Statin intolerant  . Neuropathy    Past Surgical History:  Past Surgical History:  Procedure Laterality Date  . APPENDECTOMY    . CATARACT EXTRACTION    . COLON SURGERY    . TOTAL ABDOMINAL HYSTERECTOMY    . TOTAL ABDOMINAL HYSTERECTOMY W/ BILATERAL SALPINGOOPHORECTOMY     HPI:  Lisa Kemp is a  86 y.o. adult with medical history significant for diastolic CHF, coronary artery disease, subdural hematoma, hypertension and dementia, with chronic respiratory failure on 3 L at baseline.History is obtained from nursing home and EDP, as at the time of my evaluation, the patient is awake and alert, she is not answering questions, but has incoherently.Patient was brought to the ED with reports of a fall in her bathroom at her ALF.  Nursing staff reported increasing confusion, and the patient has been diagnosed with UTI this week and is currently on Augmentin, last dose to be taken this evening.  Patient sustained a large skin tear to her right lateral lower leg. MRI reveals "Acute to early subacute infarction affecting the left temporal lobe, with minor involvement of the adjacent insular brain."   Assessment / Plan / Recommendation Clinical Impression  Pt presents with expressive and receptive aphasia; Expressive aphasia is characterized by neologisms/jargon, substitutions of words, fluently stringing together nonsense words and real words and leaving out relevent content -- She does not seem to be aware of her errors at this time. Receptive Aphasia is characterized by unreliable yes/no questions, and difficulty understanding speech. Pt named 1/10 objects in the room; note perseveration of the word "cottage saw"- this word was spoken when asked to name most objects. Pt required maximal cues and multiple different attempts of asking with different approaches in order to produce her name; she did eventually report her name and her last name. It is difficult to assess Pt's cognition at this time secondary to expressive/receptive aphasia. ST will continue to follow while in acute.  SLP Assessment  SLP Recommendation/Assessment: Patient needs continued Speech Lanaguage Pathology Services SLP Visit Diagnosis: Aphasia (R47.01)    Follow Up Recommendations  Skilled Nursing facility    Frequency and Duration  min 1 x/week  1 week      SLP Evaluation Cognition  Overall Cognitive Status: Impaired/Different from baseline Arousal/Alertness: Awake/alert Orientation Level: Disoriented X4       Comprehension  Auditory Comprehension Overall Auditory Comprehension: Impaired Yes/No Questions: Impaired Basic Biographical Questions: 0-25% accurate Commands: Impaired Conversation: Simple    Expression Expression Primary Mode of Expression: Verbal Verbal Expression Overall Verbal Expression: Impaired Initiation: Impaired Automatic Speech: Name Level of Generative/Spontaneous Verbalization: Word Repetition: Impaired Level of Impairment: Word level Naming: Impairment Responsive: 0-25% accurate Confrontation: Impaired Convergent: 0-24% accurate Divergent: 0-24% accurate Verbal Errors: Neologisms;Perseveration;Phonemic paraphasias;Semantic paraphasias;Language of confusion;Not aware of errors;Jargon Pragmatics: No impairment   Oral / Motor       Oral Motor/Sensory Function Overall Oral Motor/Sensory Function: Within functional limits Motor Speech Overall Motor Speech: Appears within functional limits for tasks assessed   Mycah Mcdougall H. Roddie Mc, CCC-SLP Speech Language Pathologist            Wende Bushy 10/16/2019, 3:32 PM

## 2019-10-16 NOTE — Plan of Care (Signed)
  Problem: Acute Rehab PT Goals(only PT should resolve) Goal: Pt Will Go Supine/Side To Sit Outcome: Progressing Flowsheets (Taken 10/16/2019 1432) Pt will go Supine/Side to Sit: with supervision Goal: Patient Will Transfer Sit To/From Stand Outcome: Progressing Flowsheets (Taken 10/16/2019 1432) Patient will transfer sit to/from stand: with min guard assist Goal: Pt Will Transfer Bed To Chair/Chair To Bed Outcome: Progressing Flowsheets (Taken 10/16/2019 1432) Pt will Transfer Bed to Chair/Chair to Bed: min guard assist  2:33 PM, 10/16/19 Mearl Latin PT, DPT Physical Therapist at [facility]

## 2019-10-16 NOTE — Progress Notes (Signed)
*  PRELIMINARY RESULTS* Echocardiogram 2D Echocardiogram has been performed.  Lisa Kemp 10/16/2019, 9:46 AM

## 2019-10-17 ENCOUNTER — Inpatient Hospital Stay (HOSPITAL_COMMUNITY): Payer: Medicare Other

## 2019-10-17 DIAGNOSIS — G9341 Metabolic encephalopathy: Secondary | ICD-10-CM

## 2019-10-17 DIAGNOSIS — F0390 Unspecified dementia without behavioral disturbance: Secondary | ICD-10-CM

## 2019-10-17 DIAGNOSIS — I480 Paroxysmal atrial fibrillation: Secondary | ICD-10-CM

## 2019-10-17 DIAGNOSIS — I10 Essential (primary) hypertension: Secondary | ICD-10-CM

## 2019-10-17 DIAGNOSIS — E039 Hypothyroidism, unspecified: Secondary | ICD-10-CM

## 2019-10-17 LAB — URINALYSIS, ROUTINE W REFLEX MICROSCOPIC
Bilirubin Urine: NEGATIVE
Glucose, UA: NEGATIVE mg/dL
Hgb urine dipstick: NEGATIVE
Ketones, ur: NEGATIVE mg/dL
Leukocytes,Ua: NEGATIVE
Nitrite: NEGATIVE
Protein, ur: NEGATIVE mg/dL
Specific Gravity, Urine: 1.012 (ref 1.005–1.030)
pH: 7 (ref 5.0–8.0)

## 2019-10-17 LAB — TSH: TSH: 0.29 u[IU]/mL — ABNORMAL LOW (ref 0.350–4.500)

## 2019-10-17 MED ORDER — LORAZEPAM 2 MG/ML IJ SOLN
INTRAMUSCULAR | Status: AC
  Administered 2019-10-17: 16:00:00 0.5 mg via INTRAVENOUS
  Filled 2019-10-17: qty 1

## 2019-10-17 MED ORDER — LORAZEPAM 2 MG/ML IJ SOLN
0.5000 mg | Freq: Four times a day (QID) | INTRAMUSCULAR | Status: DC | PRN
  Administered 2019-10-17 – 2019-10-19 (×4): 0.5 mg via INTRAVENOUS
  Filled 2019-10-17 (×4): qty 1

## 2019-10-17 MED ORDER — LORAZEPAM 2 MG/ML IJ SOLN: 1.0000 mg | Freq: Four times a day (QID) | INTRAMUSCULAR | Status: DC | PRN

## 2019-10-17 MED ORDER — HALOPERIDOL LACTATE 5 MG/ML IJ SOLN
5.0000 mg | Freq: Once | INTRAMUSCULAR | Status: AC
  Administered 2019-10-17: 23:00:00 5 mg via INTRAVENOUS
  Filled 2019-10-17: qty 1

## 2019-10-17 NOTE — Procedures (Signed)
Patient refused Carotid Ultrasound . vmm

## 2019-10-17 NOTE — Progress Notes (Signed)
Patient Demographics:    Clarice Scherff, is a 86 y.o. adult, DOB - 1923-10-13, [id number]  Admit date - 10/15/2019   Admitting Physician Ejiroghene Arlyce Dice, MD  Outpatient Primary MD for the patient is Glenda Chroman, MD  LOS - 2  Chief Complaint  Patient presents with   Fall        Subjective:    Janele Mcbrearty denies any complaints today. She wants to be left alone and does not engage in conversation.    Assessment  & Plan :    Principal Problem:   Acute CVA (cerebrovascular accident)/Lt temporal lobe AND [place] Active Problems:   Essential hypertension, benign   H/o SDH (subdural hematoma) -2015   Hypothyroidism   Dementia (HCC)   Unspecified atrial fibrillation (HCC)   Acute metabolic encephalopathy   Brief Summary:- 86 y.o.female with medical history significant for dCHF, CAD, H/o prior SDH , HTN, dementia and  chronic respiratory failure on 3 L at baseline admitted [facility] 10/15/2019 [facility] after a fall, found to have acute CVA-remains confused  A/p 1) acute CVA of left temporal lobe and insular --cranial imaging studies including CT head, MRI brain, MRA head noted -A1c 5.6,  -Echo with EF of 55 to 60% with grade 1 diastolic dysfunction -No regional wall motion abnormalities -PTA patient was on aspirin 81 mg daily, treat empirically with aspirin and Plavix combo for 3 weeks then transition to Plavix monotherapy after 3 weeks  -History of statin intolerance, LDL 105, treat empirically with Lipitor 20 mg daily -PT and speech therapy eval appreciated recommends SNF rehab  2) acute metabolic encephalopathy superimposed on underlying dementia--- most likely due to acute stroke as above #1, apparently recently treated for UTI with Augmentin, --Current UA does not show any signs of infection -At baseline apparently patient has difficulties with mobility related ADLs, gets  around with wheelchair at ALF -Use Ativan as needed for agitation - 3)HTN--allow permissive hypertension in the setting of acute stroke, hold lisinopril and Lasix until 10/18/2019  4) hypothyroidism--- continue levothyroxine 175 mcg daily, TSH 0.2  5) status post fall--- may be secondary to #1 above, PT eval appreciated, no acute fractures on imaging studies, recommends SNF rehab  6) hyperkalemia--- most likely due to concomitant lisinopril use as well as potassium replacements PTA -Lisinopril and potassium has been discontinued, repeat potassium down to 5.1 from 5.8, creatinine is 0.93  7)New onset atrial fibrillation --- most likely paroxysmal A. Fib -TSH pending, potassium has normalized, echo with EF of 55 to 60% HASBLED Score = 4  CHA2DS2- VASc score   is = 7    Which is  equal to = 11.2% annual risk of stroke  --Discussed risk Versus benefit of FULL anticoagulation with patient's sister [name]-- [phone] sister declined for anticoagulation given increased's HASBLED score and history of falls -Continue antiplatelet therapy as above #1  8) chronic anemia--- suspect partly nutritional, hemoglobin currently 9.2, her baseline is around 10, no evidence of ongoing bleeding -Monitor closely for possible bleeding especially with DAPT  Disposition/Need for in-Hospital Stay- patient unable to be discharged at this time due to --- acute CVA, awaiting transfer to SNF rehab. Family would like for her to return to ALF where  she has been for 2.5 years. Will discuss with TOC to see if [facility] is able to accommodate patient's needs   Code Status : DNR  Family Communication:  Discussed with [name]-- [phone] sister who is her POA  Consults  :  na  DVT Prophylaxis  :  Lovenox -   SCDs   Lab Results  Component Value Date   PLT 198 10/16/2019    Inpatient Medications  Scheduled Meds:  aspirin  81 mg Oral Daily   atorvastatin  20 mg Oral  q1800   clopidogrel  75 mg Oral Daily   enoxaparin (LOVENOX) injection  40 mg Subcutaneous Q24H   [START ON 10/18/2019] furosemide  40 mg Oral Daily   levothyroxine  175 mcg Oral Daily   [START ON 10/18/2019] lisinopril  5 mg Oral Daily   pantoprazole  40 mg Oral Daily   Continuous Infusions: PRN Meds:.acetaminophen **OR** acetaminophen, HYDROcodone-acetaminophen, LORazepam, ondansetron **OR** ondansetron (ZOFRAN) IV, polyethylene glycol   Anti-infectives (From admission, onward)   None        Objective:   Vitals:   10/17/19 0500 10/17/19 0930 10/17/19 1330 10/17/19 1730  BP: 126/89 (!) 162/69 (!) 168/72 (!) 130/42  Pulse: 82 79 88 78  Resp: 20 20 20 18   Temp: (!) 97.5 F (36.4 C) 98.2 F (36.8 C) 98.5 F (36.9 C) 98.8 F (37.1 C)  TempSrc: Oral Oral    SpO2: 100% 100% 98% 100%  Weight:      Height:        Wt Readings from Last 3 Encounters:  10/15/19 74 kg  03/06/19 76.9 kg  02/12/19 67.5 kg    No intake or output data in the 24 hours ending 10/17/19 1844   Physical Exam  General exam: Alert, awake, no distress Respiratory system: Clear to auscultation. Respiratory effort normal. Cardiovascular system:RRR. No murmurs, rubs, gallops. Gastrointestinal system: Abdomen is nondistended, soft and nontender. No organomegaly or masses felt. Normal bowel sounds heard. Central nervous system:  No focal neurological deficits. Extremities: No C/C/E, +pedal pulses Skin: No rashes, lesions or ulcers Psychiatry: Awake, does not want to cooperate with exam    Data Review:   Micro Results Recent Results (from the past 240 hour(s))  SARS CORONAVIRUS 2 (TAT 6-24 HRS) Nasopharyngeal Nasopharyngeal Swab     Status: None   Collection Time: 10/15/19  7:40 PM   Specimen: Nasopharyngeal Swab  Result Value Ref Range Status   SARS Coronavirus 2 NEGATIVE NEGATIVE Final    Comment: (NOTE) SARS-CoV-2 target nucleic acids are NOT DETECTED. The SARS-CoV-2 RNA is generally  detectable in upper and lower respiratory specimens during the acute phase of infection. Negative results do not preclude SARS-CoV-2 infection, do not rule out co-infections with other pathogens, and should not be used as the sole basis for treatment or other patient management decisions. Negative results must be combined with clinical observations, patient history, and epidemiological information. The expected result is Negative. Fact Sheet for Patients: SugarRoll.be Fact Sheet for Healthcare Providers: https://www.woods-mathews.com/ This test is not yet approved or cleared by the [place] FDA and  has been authorized for detection and/or diagnosis of SARS-CoV-2 by FDA under an Emergency Use Authorization (EUA). This EUA will remain  in effect (meaning this test can be used) for the duration of the COVID-19 declaration under Section 56 4(b)(1) of the Act, 21 U.S.C. section 360bbb-3(b)(1), unless the authorization is terminated or revoked sooner. Performed at [facility], [place],  [place] [address]     Radiology Reports DG Chest 1 View  Result Date: 10/15/2019 CLINICAL DATA:  86 year old female with fall.  Shortness of breath. EXAM: CHEST  1 VIEW COMPARISON:  Chest radiograph dated 03/04/2019. FINDINGS: A crescentic airspace opacity at the medial right lung base, likely atelectasis secondary to a moderate-sized hiatal hernia. The lungs are otherwise clear. There is no pleural effusion or pneumothorax. Mild cardiomegaly. Atherosclerotic calcification of the aorta. No acute osseous pathology. Osteopenia. IMPRESSION: Probable area of atelectasis in the right infrahilar region secondary to a moderate size hiatal hernia. No definite acute cardiopulmonary process. Electronically Signed   By: Anner Crete M.D.   On: 10/15/2019 18:01   DG Pelvis 1-2 Views  Result Date: 10/15/2019 CLINICAL DATA:  86 year old female with  fall. EXAM: PELVIS - 1-2 VIEW COMPARISON:  None. FINDINGS: There is no acute fracture or dislocation. The bones are osteopenic. There is degenerative changes of the visualized lower lumbar spine. Multiple surgical clips noted over the pelvis. The soft tissues are unremarkable. Small radiopaque foci over the lower abdomen bilaterally may represent fecalith versus renal calculi. Vascular calcifications noted. IMPRESSION: No acute fracture or dislocation. Electronically Signed   By: Anner Crete M.D.   On: 10/15/2019 18:03   DG Tibia/Fibula Right  Result Date: 10/15/2019 CLINICAL DATA:  86 year old female status post fall with right lower leg skin tear. EXAM: RIGHT TIBIA AND FIBULA - 2 VIEW COMPARISON:  None. FINDINGS: Calcified peripheral vascular disease. Generalized soft tissue stranding. No soft tissue gas identified. No radiopaque foreign body identified. No acute osseous abnormality identified. Severe lateral compartment joint degeneration at the right knee. IMPRESSION: 1. No acute osseous abnormality identified about the right tib-fib. Severe lateral compartment right knee joint degeneration. 2. Calcified peripheral vascular disease. Generalized soft tissue stranding. Electronically Signed   By: Genevie Ann M.D.   On: 10/15/2019 18:03   CT Head Wo Contrast  Result Date: 10/15/2019 CLINICAL DATA:  Head trauma, headache. Additional history provided: Unwitnessed fall, increased confusion this week, recent diagnosis of UTI, history of hypertension, dementia, intracranial hemorrhage EXAM: CT HEAD WITHOUT CONTRAST TECHNIQUE: Contiguous axial images were obtained from the base of the skull through the vertex without intravenous contrast. COMPARISON:  Head CT 03/22/2015. FINDINGS: Brain: There is no evidence of acute intracranial hemorrhage. No midline shift or extra-axial fluid collection. There is a region of abnormal hypodensity within the left temporal lobe measuring 6.2 x 2.0 x 1.8 cm (AP x TV x CC) (series  2, image 15) (series 4, image 48). This abnormal hypodensity appears to predominantly affect the white matter. Mild for age ill-defined hypoattenuation within the remainder of the cerebral white matter is nonspecific, but consistent with chronic small vessel ischemic disease. Mild generalized parenchymal atrophy. Redemonstrated nonspecific irregular dural-based calcifications along right frontal convexity. Vascular: No hyperdense vessel. Atherosclerotic calcifications. Skull: No calvarial fracture or aggressive osseous lesion. Sinuses/Orbits: Visualized orbits demonstrate no acute abnormality. No significant paranasal sinus disease or mastoid effusion. Other: Right parietal scalp soft tissue swelling These results were called by telephone at the time of interpretation on 10/15/2019 at 5:51 pm to provider [facility] , who verbally acknowledged these results. IMPRESSION: 6.2 cm region of abnormal hypodensity within the left temporal lobe predominantly affecting the white matter. This finding is new from CT 03/22/2015 and indeterminate. Primary differential considerations include edema related to a primary or metastatic neoplasm versus an infectious/inflammatory process. Recent ischemic infarction is also a consideration, although this would be an atypical appearance. Contrast-enhanced  brain MRI is recommended for further evaluation. Background mild generalized parenchymal atrophy and chronic small vessel ischemic disease. No evidence of acute intracranial hemorrhage. Right parietal scalp soft tissue swelling. Electronically Signed   By: Kellie Simmering DO   On: 10/15/2019 17:51   MR ANGIO HEAD WO CONTRAST  Result Date: 10/15/2019 CLINICAL DATA:  [place].  Left temporal abnormality. EXAM: MRA HEAD WITHOUT CONTRAST TECHNIQUE: Angiographic images of the Circle of Willis were obtained using MRA technique without intravenous contrast. COMPARISON:  Brain study same day FINDINGS: The study is limited by motion degradation.  Both internal carotid arteries are patent through the skull base and siphon regions. There is flow in the anterior and middle cerebral arteries. Distal vessels cannot be evaluated. Both vertebral arteries are patent to the basilar. The basilar artery shows flow. Both superior cerebellar arteries and posterior cerebral arteries show flow. IMPRESSION: Limited and motion degraded exam. Flow within the major vessels at the base of the brain. Distal vessel information not reliable because of pronounced motion. Electronically Signed   By: Nelson Chimes M.D.   On: 10/15/2019 19:20   MR BRAIN WO CONTRAST  Result Date: 10/15/2019 CLINICAL DATA:  Encephalopathy. Unwitnessed fall with trauma to the head. EXAM: MRI HEAD WITHOUT CONTRAST TECHNIQUE: Multiplanar, multiecho pulse sequences of the brain and surrounding structures were obtained without intravenous contrast. COMPARISON:  Head CT same day.  Head CT 03/22/2015. FINDINGS: MRI HEAD FINDINGS The study is limited and abbreviated by patient motion. Brain: There is acute/subacute infarction affecting the left temporal lobe, with minor involvement of the insular brain. The study is markedly degraded by motion, but I do not have significant suspicion regarding tumor or cerebritis. No gross hemorrhage. No significant mass effect or shift. Elsewhere, there chronic small-vessel ischemic changes of the white matter. Vascular: No vascular information available. Skull and upper cervical spine: No abnormality seen. Sinuses/Orbits: Negative Other: None IMPRESSION: Acute to early subacute infarction affecting the left temporal lobe, with minor involvement of the adjacent insular brain. The study is limited and abbreviated by patient motion. I do not have any significant suspicion regarding tumor or cerebritis. Electronically Signed   By: Nelson Chimes M.D.   On: 10/15/2019 19:17   US Carotid Bilateral  Result Date: 10/17/2019 Griffin Basil, RT     10/17/2019 12:31 PM Patient  refused Carotid Ultrasound . vmm  ECHOCARDIOGRAM COMPLETE  Result Date: 10/16/2019    ECHOCARDIOGRAM REPORT   Patient Name:   WENDELYN CULLIPHER Date of Exam: 10/16/2019 Medical Rec #:  [id number]           Height:       65.0 in Accession #:    [id number]          Weight:       163.1 lb Date of Birth:  06-Nov-1923           BSA:          1.81 m Patient Age:    22 years            BP:           119/55 mmHg Patient Gender: F                   HR:           82 bpm. Exam Location:  [facility] Procedure: 2D Echo Indications:    Abnormal ECG 794.31 / R94.31  History:        Patient has prior history  of Echocardiogram examinations, most                 recent 03/05/2019. CHF, CAD, Stroke, Arrythmias:Atrial                 Fibrillation; Risk Factors:Hypertension, Non-Smoker and                 Dyslipidemia. GERD, Elevated troponin.  Sonographer:    Leavy Cella RDCS (AE) Referring Phys: [place]  1. Left ventricular ejection fraction, by estimation, is 55 to 60%. The left ventricle has normal function. The left ventricle has no regional wall motion abnormalities. There is mild left ventricular hypertrophy. Left ventricular diastolic parameters are consistent with Grade I diastolic dysfunction (impaired relaxation).  2. Right ventricular systolic function is normal. The right ventricular size is normal. There is moderately elevated pulmonary artery systolic pressure. The estimated right ventricular systolic pressure is Q000111Q mmHg.  3. Left atrial size was mildly dilated.  4. The mitral valve is grossly normal. Mild mitral valve regurgitation.  5. Tricuspid valve regurgitation is mild to moderate.  6. The aortic valve is tricuspid. Aortic valve regurgitation is trivial.  7. The inferior vena cava is normal in size with greater than 50% respiratory variability, suggesting right atrial pressure of 3 mmHg. FINDINGS  Left Ventricle: Left ventricular ejection fraction, by estimation, is 55 to 60%.  The left ventricle has normal function. The left ventricle has no regional wall motion abnormalities. The left ventricular internal cavity size was normal in size. There is  mild left ventricular hypertrophy. Left ventricular diastolic parameters are consistent with Grade I diastolic dysfunction (impaired relaxation). Right Ventricle: The right ventricular size is normal. No increase in right ventricular wall thickness. Right ventricular systolic function is normal. There is moderately elevated pulmonary artery systolic pressure. The tricuspid regurgitant velocity is 3.41 m/s, and with an assumed right atrial pressure of 3 mmHg, the estimated right ventricular systolic pressure is Q000111Q mmHg. Left Atrium: Left atrial size was mildly dilated. Right Atrium: Right atrial size was normal in size. Pericardium: There is no evidence of pericardial effusion. Mitral Valve: The mitral valve is grossly normal. Mild mitral valve regurgitation. Tricuspid Valve: The tricuspid valve is grossly normal. Tricuspid valve regurgitation is mild to moderate. Aortic Valve: The aortic valve is tricuspid. Aortic valve regurgitation is trivial. Aortic regurgitation PHT measures 297 msec. Moderate aortic valve annular calcification. There is mild calcification of the aortic valve. Pulmonic Valve: The pulmonic valve was grossly normal. Pulmonic valve regurgitation is not visualized. Aorta: The aortic root is normal in size and structure. Venous: The inferior vena cava is normal in size with greater than 50% respiratory variability, suggesting right atrial pressure of 3 mmHg. IAS/Shunts: No atrial level shunt detected by color flow Doppler.  LEFT VENTRICLE PLAX 2D LVIDd:         4.11 cm  Diastology LVIDs:         3.13 cm  LV e' lateral:   5.87 cm/s LV PW:         1.10 cm  LV E/e' lateral: 19.4 LV IVS:        1.33 cm  LV e' medial:    10.10 cm/s LVOT diam:     2.00 cm  LV E/e' medial:  11.3 LV SV Index:   19.27 LVOT Area:     3.14 cm  RIGHT  VENTRICLE RV S prime:     8.81 cm/s TAPSE (M-mode): 2.6 cm LEFT ATRIUM  Index       RIGHT ATRIUM           Index LA diam:        3.70 cm 2.04 cm/m  RA Area:     14.20 cm LA Vol (A2C):   82.8 ml 45.64 ml/m RA Volume:   34.10 ml  18.80 ml/m LA Vol (A4C):   54.0 ml 29.77 ml/m LA Biplane Vol: 68.9 ml 37.98 ml/m  AORTIC VALVE AI PHT:      297 msec  AORTA Ao Root diam: 3.00 cm MITRAL VALVE                TRICUSPID VALVE MV Area (PHT): 5.13 cm     TR Peak grad:   46.5 mmHg MV Decel Time: 148 msec     TR Vmax:        341.00 cm/s MR Peak grad: 130.0 mmHg MR Mean grad: 95.0 mmHg     SHUNTS MR Vmax:      570.00 cm/s   Systemic Diam: 2.00 cm MR Vmean:     463.0 cm/s MV E velocity: 114.00 cm/s MV A velocity: 117.00 cm/s MV E/A ratio:  0.97 Rozann Lesches MD Electronically signed by Rozann Lesches MD Signature Date/Time: 10/16/2019/11:40:24 AM    Final      CBC Recent Labs  Lab 10/15/19 1656 10/16/19 0517  WBC 6.4 7.0  HGB 9.4* 9.2*  HCT 30.5* 29.7*  PLT 184 198  MCV 98.7 96.7  MCH 30.4 30.0  MCHC 30.8 31.0  RDW 13.1 13.0  LYMPHSABS 1.2  --   MONOABS 0.6  --   EOSABS 0.2  --   BASOSABS 0.1  --     Chemistries  Recent Labs  Lab 10/15/19 1656 10/16/19 0517  NA 136 136  K 5.8* 5.1  CL 89* 89*  CO2 38* 36*  GLUCOSE 100* 95  BUN 44* 42*  CREATININE 1.04* 0.93  CALCIUM 10.0 10.1   ------------------------------------------------------------------------------------------------------------------ Recent Labs    10/16/19 0517  CHOL 177  HDL 45  LDLCALC 105*  TRIG 137  CHOLHDL 3.9    Lab Results  Component Value Date   HGBA1C 5.6 10/15/2019   ------------------------------------------------------------------------------------------------------------------ Recent Labs    10/15/19 1657  TSH 0.290*   ------------------------------------------------------------------------------------------------------------------ No results for input(s): VITAMINB12, FOLATE, FERRITIN,  TIBC, IRON, RETICCTPCT in the last 72 hours.  Coagulation profile No results for input(s): INR, PROTIME in the last 168 hours.  No results for input(s): DDIMER in the last 72 hours.  Cardiac Enzymes No results for input(s): CKMB, TROPONINI, MYOGLOBIN in the last 168 hours.  Invalid input(s): CK ------------------------------------------------------------------------------------------------------------------    Component Value Date/Time   BNP 260.0 (H) 10/15/2019 1656     Kathie Dike M.D on 10/17/2019 at 6:44 PM  Go to [email or web address] - for contact info  [facility] - Office  [phone]

## 2019-10-17 NOTE — Progress Notes (Signed)
Patient refuses to have Carotid US done today, MD informed

## 2019-10-18 ENCOUNTER — Inpatient Hospital Stay (HOSPITAL_COMMUNITY): Payer: Medicare Other

## 2019-10-18 LAB — BASIC METABOLIC PANEL
Anion gap: 11 (ref 5–15)
BUN: 31 mg/dL — ABNORMAL HIGH (ref 8–23)
CO2: 34 mmol/L — ABNORMAL HIGH (ref 22–32)
Calcium: 9.3 mg/dL (ref 8.9–10.3)
Chloride: 92 mmol/L — ABNORMAL LOW (ref 98–111)
Creatinine, Ser: 1.09 mg/dL — ABNORMAL HIGH (ref 0.44–1.00)
GFR calc Af Amer: 51 mL/min — ABNORMAL LOW (ref >60)
GFR calc non Af Amer: 44 mL/min — ABNORMAL LOW (ref >60)
Glucose, Bld: 74 mg/dL (ref 70–99)
Potassium: 4.1 mmol/L (ref 3.5–5.1)
Sodium: 137 mmol/L (ref 135–145)

## 2019-10-18 LAB — CBC
HCT: 26.5 % — ABNORMAL LOW (ref 36.0–46.0)
Hemoglobin: 8.2 g/dL — ABNORMAL LOW (ref 12.0–15.0)
MCH: 30 pg (ref 26.0–34.0)
MCHC: 30.9 g/dL (ref 30.0–36.0)
MCV: 97.1 fL (ref 80.0–100.0)
Platelets: 152 10*3/uL (ref 150–400)
RBC: 2.73 MIL/uL — ABNORMAL LOW (ref 3.87–5.11)
RDW: 13.3 % (ref 11.5–15.5)
WBC: 5.2 10*3/uL (ref 4.0–10.5)
nRBC: 0 % (ref 0.0–0.2)

## 2019-10-18 MED ORDER — QUETIAPINE FUMARATE 100 MG PO TABS
100.0000 mg | ORAL_TABLET | Freq: Every day | ORAL | Status: DC
  Filled 2019-10-18: qty 1

## 2019-10-18 MED ORDER — DIPHENHYDRAMINE HCL 50 MG/ML IJ SOLN
12.5000 mg | Freq: Once | INTRAMUSCULAR | Status: AC
  Administered 2019-10-18: 02:00:00 12.5 mg via INTRAVENOUS
  Filled 2019-10-18: qty 1

## 2019-10-18 NOTE — Progress Notes (Signed)
Patient Demographics:    Lisa Kemp, is a 86 y.o. adult, DOB - 11/24/1923, [id number]  Admit date - 10/15/2019   Admitting Physician Ejiroghene Arlyce Dice, MD  Outpatient Primary MD for the patient is Glenda Chroman, MD  LOS - 3  Chief Complaint  Patient presents with  . Fall        Subjective:    Lisa Kemp is currently somnolent, she recently received medication for agitation.  She remained agitated for most of the night.  P.o. intake has been poor.   Assessment  & Plan :    Principal Problem:   Acute CVA (cerebrovascular accident)/Lt temporal lobe AND Insula Active Problems:   Essential hypertension, benign   H/o SDH (subdural hematoma) -2015   Hypothyroidism   Dementia (HCC)   Unspecified atrial fibrillation (HCC)   Acute metabolic encephalopathy   Brief Summary:- 86 y.o.female with medical history significant for dCHF, CAD, H/o prior SDH , HTN, dementia and  chronic respiratory failure on 3 L at baseline admitted on 10/15/2019 from [facility] after a fall, found to have acute CVA-remains confused  A/p 1) acute CVA of left temporal lobe and insular --cranial imaging studies including CT head, MRI brain, MRA head noted -A1c 5.6,  -Echo with EF of 55 to 60% with grade 1 diastolic dysfunction -No regional wall motion abnormalities -PTA patient was on aspirin 81 mg daily, treat empirically with aspirin and Plavix combo for 3 weeks then transition to Plavix monotherapy after 3 weeks  -History of statin intolerance, LDL 105, treat empirically with Lipitor 20 mg daily -PT and speech therapy eval appreciated recommends SNF rehab  2) acute metabolic encephalopathy superimposed on underlying dementia--- most likely due to acute stroke as above #1, apparently recently treated for UTI with Augmentin, --Current UA does not show any signs of infection -At baseline apparently patient  has difficulties with mobility related ADLs, gets around with wheelchair at ALF -She has been receiving Ativan as needed, but is either somnolent or agitated -We will start on Seroquel nightly and use Haldol as needed during the day. -If mental status does not improve with improved p.o. intake, may need to consider palliative care consult.  3)HTN--allow permissive hypertension in the setting of acute stroke, hold lisinopril and Lasix until 10/18/2019  4) hypothyroidism--- continue levothyroxine 175 mcg daily, TSH 0.2  5) status post fall--- may be secondary to #1 above, PT eval appreciated, no acute fractures on imaging studies, recommends SNF rehab  6) hyperkalemia--- most likely due to concomitant lisinopril use as well as potassium replacements PTA -Lisinopril and potassium has been discontinued, repeat potassium down to 4.73from 5.8  7)New onset atrial fibrillation --- most likely paroxysmal A. Fib -TSH pending, potassium has normalized, echo with EF of 55 to 60% HASBLED Score = 4  CHA2DS2- VASc score   is = 7    Which is  equal to = 11.2% annual risk of stroke  --Discussed risk Versus benefit of FULL anticoagulation with patient's sister [name]-- [phone] sister declined for anticoagulation given increased's HASBLED score and history of falls -Continue antiplatelet therapy as above #1  8) chronic anemia--- suspect partly nutritional, hemoglobin currently 8.2, her baseline is around 10, no evidence of ongoing bleeding -  Monitor closely for possible bleeding especially with DAPT  9) urinary retention.  Noted by staff to have 450 cc retained urine on bladder scan.  She underwent in and out catheterization.  10) acute respiratory failure with hypoxia.  Patient requiring 3 to 4 L of oxygen.  When oxygen is removed, she does drop down into the 80s.  She is not coughing or febrile.  She does not appear to be volume overloaded.  Suspect that she may have hypoventilation  and atelectasis.  Will check chest x-ray.  Wean off oxygen as tolerated.  Disposition/Need for in-Hospital Stay- patient unable to be discharged at this time due to --- acute CVA, awaiting transfer to SNF rehab. Family would like for her to return to ALF where she has been for 2.5 years. Will discuss with TOC to see if [facility] is able to accommodate patient's needs   Code Status : DNR  Family Communication:  Discussed with [name]-- [id number] sister who is her POA  Consults  :  na  DVT Prophylaxis  :  Lovenox -   SCDs   Lab Results  Component Value Date   PLT 152 10/18/2019    Inpatient Medications  Scheduled Meds: . aspirin  81 mg Oral Daily  . atorvastatin  20 mg Oral q1800  . clopidogrel  75 mg Oral Daily  . enoxaparin (LOVENOX) injection  40 mg Subcutaneous Q24H  . furosemide  40 mg Oral Daily  . levothyroxine  175 mcg Oral Daily  . lisinopril  5 mg Oral Daily  . pantoprazole  40 mg Oral Daily   Continuous Infusions: PRN Meds:.acetaminophen **OR** acetaminophen, HYDROcodone-acetaminophen, LORazepam, ondansetron **OR** ondansetron (ZOFRAN) IV, polyethylene glycol   Anti-infectives (From admission, onward)   None        Objective:   Vitals:   10/18/19 0044 10/18/19 0830 10/18/19 1230 10/18/19 1630  BP: (!) 145/46 (!) 90/50 (!) 102/49 (!) 119/54  Pulse: 90 64 70 77  Resp: 18 16 16 18   Temp: 97.7 F (36.5 C) 98.2 F (36.8 C) 98.2 F (36.8 C) 98.5 F (36.9 C)  TempSrc: Oral Axillary    SpO2: 100% 98% 99% 98%  Weight:      Height:        Wt Readings from Last 3 Encounters:  10/15/19 74 kg  03/06/19 76.9 kg  02/12/19 67.5 kg     Intake/Output Summary (Last 24 hours) at 10/18/2019 1856 Last data filed at 10/18/2019 1722 Gross per 24 hour  Intake 0 ml  Output 450 ml  Net -450 ml     Physical Exam  General exam: Somnolent, recently medicated Respiratory system: Clear to auscultation. Respiratory effort  normal. Cardiovascular system:RRR. No murmurs, rubs, gallops. Gastrointestinal system: Abdomen is nondistended, soft and nontender. No organomegaly or masses felt. Normal bowel sounds heard. Central nervous system: No focal neurological deficits. Extremities: No C/C/E, +pedal pulses Skin: No rashes, lesions or ulcers Psychiatry: Somnolent.     Data Review:   Micro Results Recent Results (from the past 240 hour(s))  SARS CORONAVIRUS 2 (TAT 6-24 HRS) Nasopharyngeal Nasopharyngeal Swab     Status: None   Collection Time: 10/15/19  7:40 PM   Specimen: Nasopharyngeal Swab  Result Value Ref Range Status   SARS Coronavirus 2 NEGATIVE NEGATIVE Final    Comment: (NOTE) SARS-CoV-2 target nucleic acids are NOT DETECTED. The SARS-CoV-2 RNA is generally detectable in upper and lower respiratory specimens during the acute phase of infection. Negative results do not preclude SARS-CoV-2  infection, do not rule out co-infections with other pathogens, and should not be used as the sole basis for treatment or other patient management decisions. Negative results must be combined with clinical observations, patient history, and epidemiological information. The expected result is Negative. Fact Sheet for Patients: SugarRoll.be Fact Sheet for Healthcare Providers: https://www.woods-mathews.com/ This test is not yet approved or cleared by the [place] FDA and  has been authorized for detection and/or diagnosis of SARS-CoV-2 by FDA under an Emergency Use Authorization (EUA). This EUA will remain  in effect (meaning this test can be used) for the duration of the COVID-19 declaration under Section 56 4(b)(1) of the Act, 21 U.S.C. section 360bbb-3(b)(1), unless the authorization is terminated or revoked sooner. Performed at [facility], [place] [address], [place], [place] [address]   Urine Culture     Status: Abnormal (Preliminary result)   Collection  Time: 10/17/19  7:53 AM   Specimen: Urine, Clean Catch  Result Value Ref Range Status   Specimen Description   Final    URINE, CLEAN CATCH Performed at [facility], [address], [place], [place] [address]    Special Requests   Final    Normal Performed at [facility], [address] [place], [place] [address]    Culture 20,000 COLONIES/mL CITROBACTER SPECIES (A)  Final   Report Status PENDING  Incomplete    Radiology Reports DG Chest 1 View  Result Date: 10/15/2019 CLINICAL DATA:  86 year old female with fall.  Shortness of breath. EXAM: CHEST  1 VIEW COMPARISON:  Chest radiograph dated 03/04/2019. FINDINGS: A crescentic airspace opacity at the medial right lung base, likely atelectasis secondary to a moderate-sized hiatal hernia. The lungs are otherwise clear. There is no pleural effusion or pneumothorax. Mild cardiomegaly. Atherosclerotic calcification of the aorta. No acute osseous pathology. Osteopenia. IMPRESSION: Probable area of atelectasis in the right infrahilar region secondary to a moderate size hiatal hernia. No definite acute cardiopulmonary process. Electronically Signed   By: Anner Crete M.D.   On: 10/15/2019 18:01   DG Pelvis 1-2 Views  Result Date: 10/15/2019 CLINICAL DATA:  86 year old female with fall. EXAM: PELVIS - 1-2 VIEW COMPARISON:  None. FINDINGS: There is no acute fracture or dislocation. The bones are osteopenic. There is degenerative changes of the visualized lower lumbar spine. Multiple surgical clips noted over the pelvis. The soft tissues are unremarkable. Small radiopaque foci over the lower abdomen bilaterally may represent fecalith versus renal calculi. Vascular calcifications noted. IMPRESSION: No acute fracture or dislocation. Electronically Signed   By: Anner Crete M.D.   On: 10/15/2019 18:03   DG Tibia/Fibula Right  Result Date: 10/15/2019 CLINICAL DATA:  86 year old female status post fall with right lower leg skin tear. EXAM: RIGHT  TIBIA AND FIBULA - 2 VIEW COMPARISON:  None. FINDINGS: Calcified peripheral vascular disease. Generalized soft tissue stranding. No soft tissue gas identified. No radiopaque foreign body identified. No acute osseous abnormality identified. Severe lateral compartment joint degeneration at the right knee. IMPRESSION: 1. No acute osseous abnormality identified about the right tib-fib. Severe lateral compartment right knee joint degeneration. 2. Calcified peripheral vascular disease. Generalized soft tissue stranding. Electronically Signed   By: Genevie Ann M.D.   On: 10/15/2019 18:03   CT Head Wo Contrast  Result Date: 10/15/2019 CLINICAL DATA:  Head trauma, headache. Additional history provided: Unwitnessed fall, increased confusion this week, recent diagnosis of UTI, history of hypertension, dementia, intracranial hemorrhage EXAM: CT HEAD WITHOUT CONTRAST TECHNIQUE: Contiguous axial images were  obtained from the base of the skull through the vertex without intravenous contrast. COMPARISON:  Head CT 03/22/2015. FINDINGS: Brain: There is no evidence of acute intracranial hemorrhage. No midline shift or extra-axial fluid collection. There is a region of abnormal hypodensity within the left temporal lobe measuring 6.2 x 2.0 x 1.8 cm (AP x TV x CC) (series 2, image 15) (series 4, image 48). This abnormal hypodensity appears to predominantly affect the white matter. Mild for age ill-defined hypoattenuation within the remainder of the cerebral white matter is nonspecific, but consistent with chronic small vessel ischemic disease. Mild generalized parenchymal atrophy. Redemonstrated nonspecific irregular dural-based calcifications along right frontal convexity. Vascular: No hyperdense vessel. Atherosclerotic calcifications. Skull: No calvarial fracture or aggressive osseous lesion. Sinuses/Orbits: Visualized orbits demonstrate no acute abnormality. No significant paranasal sinus disease or mastoid effusion. Other: Right  parietal scalp soft tissue swelling These results were called by telephone at the time of interpretation on 10/15/2019 at 5:51 pm to provider [facility] , who verbally acknowledged these results. IMPRESSION: 6.2 cm region of abnormal hypodensity within the left temporal lobe predominantly affecting the white matter. This finding is new from CT 03/22/2015 and indeterminate. Primary differential considerations include edema related to a primary or metastatic neoplasm versus an infectious/inflammatory process. Recent ischemic infarction is also a consideration, although this would be an atypical appearance. Contrast-enhanced brain MRI is recommended for further evaluation. Background mild generalized parenchymal atrophy and chronic small vessel ischemic disease. No evidence of acute intracranial hemorrhage. Right parietal scalp soft tissue swelling. Electronically Signed   By: Kellie Simmering DO   On: 10/15/2019 17:51   MR ANGIO HEAD WO CONTRAST  Result Date: 10/15/2019 CLINICAL DATA:  [place].  Left temporal abnormality. EXAM: MRA HEAD WITHOUT CONTRAST TECHNIQUE: Angiographic images of the Circle of Willis were obtained using MRA technique without intravenous contrast. COMPARISON:  Brain study same day FINDINGS: The study is limited by motion degradation. Both internal carotid arteries are patent through the skull base and siphon regions. There is flow in the anterior and middle cerebral arteries. Distal vessels cannot be evaluated. Both vertebral arteries are patent to the basilar. The basilar artery shows flow. Both superior cerebellar arteries and posterior cerebral arteries show flow. IMPRESSION: Limited and motion degraded exam. Flow within the major vessels at the base of the brain. Distal vessel information not reliable because of pronounced motion. Electronically Signed   By: Nelson Chimes M.D.   On: 10/15/2019 19:20   MR BRAIN WO CONTRAST  Result Date: 10/15/2019 CLINICAL DATA:  Encephalopathy.  Unwitnessed fall with trauma to the head. EXAM: MRI HEAD WITHOUT CONTRAST TECHNIQUE: Multiplanar, multiecho pulse sequences of the brain and surrounding structures were obtained without intravenous contrast. COMPARISON:  Head CT same day.  Head CT 03/22/2015. FINDINGS: MRI HEAD FINDINGS The study is limited and abbreviated by patient motion. Brain: There is acute/subacute infarction affecting the left temporal lobe, with minor involvement of the insular brain. The study is markedly degraded by motion, but I do not have significant suspicion regarding tumor or cerebritis. No gross hemorrhage. No significant mass effect or shift. Elsewhere, there chronic small-vessel ischemic changes of the white matter. Vascular: No vascular information available. Skull and upper cervical spine: No abnormality seen. Sinuses/Orbits: Negative Other: None IMPRESSION: Acute to early subacute infarction affecting the left temporal lobe, with minor involvement of the adjacent insular brain. The study is limited and abbreviated by patient motion. I do not have any significant suspicion regarding tumor or cerebritis. Electronically Signed  By: Nelson Chimes M.D.   On: 10/15/2019 19:17   US Carotid Bilateral  Result Date: 10/17/2019 Griffin Basil, RT     10/17/2019 12:31 PM Patient refused Carotid Ultrasound . vmm  ECHOCARDIOGRAM COMPLETE  Result Date: 10/16/2019    ECHOCARDIOGRAM REPORT   Patient Name:   Lisa Kemp Date of Exam: 10/16/2019 Medical Rec #:  [id number]           Height:       65.0 in Accession #:    [id number]          Weight:       163.1 lb Date of Birth:  Jun 10, 1924           BSA:          1.81 m Patient Age:    50 years            BP:           119/55 mmHg Patient Gender: F                   HR:           82 bpm. Exam Location:  [facility] Procedure: 2D Echo Indications:    Abnormal ECG 794.31 / R94.31  History:        Patient has prior history of Echocardiogram examinations, most                 recent  03/05/2019. CHF, CAD, Stroke, Arrythmias:Atrial                 Fibrillation; Risk Factors:Hypertension, Non-Smoker and                 Dyslipidemia. GERD, Elevated troponin.  Sonographer:    Leavy Cella RDCS (AE) Referring Phys: [place]  1. Left ventricular ejection fraction, by estimation, is 55 to 60%. The left ventricle has normal function. The left ventricle has no regional wall motion abnormalities. There is mild left ventricular hypertrophy. Left ventricular diastolic parameters are consistent with Grade I diastolic dysfunction (impaired relaxation).  2. Right ventricular systolic function is normal. The right ventricular size is normal. There is moderately elevated pulmonary artery systolic pressure. The estimated right ventricular systolic pressure is Q000111Q mmHg.  3. Left atrial size was mildly dilated.  4. The mitral valve is grossly normal. Mild mitral valve regurgitation.  5. Tricuspid valve regurgitation is mild to moderate.  6. The aortic valve is tricuspid. Aortic valve regurgitation is trivial.  7. The inferior vena cava is normal in size with greater than 50% respiratory variability, suggesting right atrial pressure of 3 mmHg. FINDINGS  Left Ventricle: Left ventricular ejection fraction, by estimation, is 55 to 60%. The left ventricle has normal function. The left ventricle has no regional wall motion abnormalities. The left ventricular internal cavity size was normal in size. There is  mild left ventricular hypertrophy. Left ventricular diastolic parameters are consistent with Grade I diastolic dysfunction (impaired relaxation). Right Ventricle: The right ventricular size is normal. No increase in right ventricular wall thickness. Right ventricular systolic function is normal. There is moderately elevated pulmonary artery systolic pressure. The tricuspid regurgitant velocity is 3.41 m/s, and with an assumed right atrial pressure of 3 mmHg, the estimated right  ventricular systolic pressure is Q000111Q mmHg. Left Atrium: Left atrial size was mildly dilated. Right Atrium: Right atrial size was normal in size. Pericardium: There is no evidence of pericardial effusion. Mitral Valve: The mitral valve  is grossly normal. Mild mitral valve regurgitation. Tricuspid Valve: The tricuspid valve is grossly normal. Tricuspid valve regurgitation is mild to moderate. Aortic Valve: The aortic valve is tricuspid. Aortic valve regurgitation is trivial. Aortic regurgitation PHT measures 297 msec. Moderate aortic valve annular calcification. There is mild calcification of the aortic valve. Pulmonic Valve: The pulmonic valve was grossly normal. Pulmonic valve regurgitation is not visualized. Aorta: The aortic root is normal in size and structure. Venous: The inferior vena cava is normal in size with greater than 50% respiratory variability, suggesting right atrial pressure of 3 mmHg. IAS/Shunts: No atrial level shunt detected by color flow Doppler.  LEFT VENTRICLE PLAX 2D LVIDd:         4.11 cm  Diastology LVIDs:         3.13 cm  LV e' lateral:   5.87 cm/s LV PW:         1.10 cm  LV E/e' lateral: 19.4 LV IVS:        1.33 cm  LV e' medial:    10.10 cm/s LVOT diam:     2.00 cm  LV E/e' medial:  11.3 LV SV Index:   19.27 LVOT Area:     3.14 cm  RIGHT VENTRICLE RV S prime:     8.81 cm/s TAPSE (M-mode): 2.6 cm LEFT ATRIUM             Index       RIGHT ATRIUM           Index LA diam:        3.70 cm 2.04 cm/m  RA Area:     14.20 cm LA Vol (A2C):   82.8 ml 45.64 ml/m RA Volume:   34.10 ml  18.80 ml/m LA Vol (A4C):   54.0 ml 29.77 ml/m LA Biplane Vol: 68.9 ml 37.98 ml/m  AORTIC VALVE AI PHT:      297 msec  AORTA Ao Root diam: 3.00 cm MITRAL VALVE                TRICUSPID VALVE MV Area (PHT): 5.13 cm     TR Peak grad:   46.5 mmHg MV Decel Time: 148 msec     TR Vmax:        341.00 cm/s MR Peak grad: 130.0 mmHg MR Mean grad: 95.0 mmHg     SHUNTS MR Vmax:      570.00 cm/s   Systemic Diam: 2.00 cm MR  Vmean:     463.0 cm/s MV E velocity: 114.00 cm/s MV A velocity: 117.00 cm/s MV E/A ratio:  0.97 Rozann Lesches MD Electronically signed by Rozann Lesches MD Signature Date/Time: 10/16/2019/11:40:24 AM    Final      CBC Recent Labs  Lab 10/15/19 1656 10/16/19 0517 10/18/19 0856  WBC 6.4 7.0 5.2  HGB 9.4* 9.2* 8.2*  HCT 30.5* 29.7* 26.5*  PLT 184 198 152  MCV 98.7 96.7 97.1  MCH 30.4 30.0 30.0  MCHC 30.8 31.0 30.9  RDW 13.1 13.0 13.3  LYMPHSABS 1.2  --   --   MONOABS 0.6  --   --   EOSABS 0.2  --   --   BASOSABS 0.1  --   --     Chemistries  Recent Labs  Lab 10/15/19 1656 10/16/19 0517 10/18/19 0856  NA 136 136 137  K 5.8* 5.1 4.1  CL 89* 89* 92*  CO2 38* 36* 34*  GLUCOSE 100* 95 74  BUN 44* 42* 31*  CREATININE 1.04* 0.93 1.09*  CALCIUM 10.0 10.1 9.3   ------------------------------------------------------------------------------------------------------------------ Recent Labs    10/16/19 0517  CHOL 177  HDL 45  LDLCALC 105*  TRIG 137  CHOLHDL 3.9    Lab Results  Component Value Date   HGBA1C 5.6 10/15/2019   ------------------------------------------------------------------------------------------------------------------ No results for input(s): TSH, T4TOTAL, T3FREE, THYROIDAB in the last 72 hours.  Invalid input(s): FREET3 ------------------------------------------------------------------------------------------------------------------ No results for input(s): VITAMINB12, FOLATE, FERRITIN, TIBC, IRON, RETICCTPCT in the last 72 hours.  Coagulation profile No results for input(s): INR, PROTIME in the last 168 hours.  No results for input(s): DDIMER in the last 72 hours.  Cardiac Enzymes No results for input(s): CKMB, TROPONINI, MYOGLOBIN in the last 168 hours.  Invalid input(s): CK ------------------------------------------------------------------------------------------------------------------    Component Value Date/Time   BNP 260.0 (H)  10/15/2019 1656     Kathie Dike M.D on 10/18/2019 at 6:56 PM  Go to [email or web address] - for contact info  [facility] - Office  [phone]

## 2019-10-18 NOTE — Progress Notes (Signed)
Patient had not urinated this shift and bladder scanner showed 500cc urine. In and out catheterization performed with 450cc returned. MD aware

## 2019-10-19 LAB — BASIC METABOLIC PANEL
Anion gap: 14 (ref 5–15)
BUN: 42 mg/dL — ABNORMAL HIGH (ref 8–23)
CO2: 31 mmol/L (ref 22–32)
Calcium: 9.3 mg/dL (ref 8.9–10.3)
Chloride: 96 mmol/L — ABNORMAL LOW (ref 98–111)
Creatinine, Ser: 1.27 mg/dL — ABNORMAL HIGH (ref 0.44–1.00)
GFR calc Af Amer: 42 mL/min — ABNORMAL LOW (ref >60)
GFR calc non Af Amer: 36 mL/min — ABNORMAL LOW (ref >60)
Glucose, Bld: 57 mg/dL — ABNORMAL LOW (ref 70–99)
Potassium: 4.4 mmol/L (ref 3.5–5.1)
Sodium: 141 mmol/L (ref 135–145)

## 2019-10-19 LAB — GLUCOSE, CAPILLARY
Glucose-Capillary: 109 mg/dL — ABNORMAL HIGH (ref 70–99)
Glucose-Capillary: 53 mg/dL — ABNORMAL LOW (ref 70–99)
Glucose-Capillary: 73 mg/dL (ref 70–99)

## 2019-10-19 LAB — URINE CULTURE
Culture: 20000 — AB
Special Requests: NORMAL

## 2019-10-19 MED ORDER — HYDROCODONE-ACETAMINOPHEN 5-325 MG PO TABS: 1.0000 | ORAL_TABLET | Freq: Four times a day (QID) | ORAL | 0 refills | Status: DC | PRN

## 2019-10-19 MED ORDER — ASPIRIN 81 MG PO TABS: 81.0000 mg | ORAL_TABLET | Freq: Every day | ORAL | 0 refills | Status: DC

## 2019-10-19 MED ORDER — QUETIAPINE FUMARATE 25 MG PO TABS: 25.0000 mg | ORAL_TABLET | Freq: Two times a day (BID) | ORAL | 2 refills | Status: DC

## 2019-10-19 MED ORDER — MIRTAZAPINE 15 MG PO TBDP
ORAL_TABLET | ORAL | Status: AC
  Filled 2019-10-19: qty 1

## 2019-10-19 MED ORDER — BISACODYL 10 MG RE SUPP: 10.0000 mg | Freq: Every day | RECTAL | Status: DC | PRN

## 2019-10-19 MED ORDER — CLOPIDOGREL BISULFATE 75 MG PO TABS: 75.0000 mg | ORAL_TABLET | Freq: Every day | ORAL | Status: DC

## 2019-10-19 MED ORDER — ACETAMINOPHEN 325 MG PO TABS: 650.0000 mg | ORAL_TABLET | Freq: Four times a day (QID) | ORAL | 0 refills | Status: DC | PRN

## 2019-10-19 MED ORDER — MIRTAZAPINE 15 MG PO TABS: 15.0000 mg | ORAL_TABLET | Freq: Every day | ORAL | 2 refills | Status: DC

## 2019-10-19 MED ORDER — LORAZEPAM 2 MG/ML PO CONC: 1.0000 mg | ORAL | Status: DC | PRN

## 2019-10-19 MED ORDER — DEXTROSE 50 % IV SOLN
INTRAVENOUS | Status: AC
  Administered 2019-10-19: 09:00:00 25 mL via INTRAVENOUS
  Filled 2019-10-19: qty 50

## 2019-10-19 MED ORDER — ONDANSETRON HCL 4 MG/2ML IJ SOLN: 4.0000 mg | Freq: Four times a day (QID) | INTRAMUSCULAR | Status: DC | PRN

## 2019-10-19 MED ORDER — LORAZEPAM 2 MG/ML IJ SOLN: 1.0000 mg | INTRAMUSCULAR | Status: DC | PRN

## 2019-10-19 MED ORDER — MIRTAZAPINE 15 MG PO TBDP
15.0000 mg | ORAL_TABLET | Freq: Every day | ORAL | Status: DC
  Administered 2019-10-19: 15 mg via ORAL
  Filled 2019-10-19 (×2): qty 1

## 2019-10-19 MED ORDER — GLYCOPYRROLATE 1 MG PO TABS: 1.0000 mg | ORAL_TABLET | ORAL | Status: DC | PRN

## 2019-10-19 MED ORDER — FENTANYL CITRATE (PF) 100 MCG/2ML IJ SOLN: 25.0000 ug | INTRAMUSCULAR | Status: DC | PRN

## 2019-10-19 MED ORDER — ENOXAPARIN SODIUM 30 MG/0.3ML ~~LOC~~ SOLN: 30.0000 mg | SUBCUTANEOUS | Status: DC

## 2019-10-19 MED ORDER — CHLORHEXIDINE GLUCONATE CLOTH 2 % EX PADS
6.0000 | MEDICATED_PAD | Freq: Every day | CUTANEOUS | Status: DC
  Administered 2019-10-19: 23:00:00 6 via TOPICAL

## 2019-10-19 MED ORDER — BIOTENE DRY MOUTH MT LIQD: 15.0000 mL | OROMUCOSAL | Status: DC | PRN

## 2019-10-19 MED ORDER — LORAZEPAM 1 MG PO TABS: 1.0000 mg | ORAL_TABLET | ORAL | Status: DC | PRN

## 2019-10-19 MED ORDER — LORAZEPAM 1 MG PO TABS: 0.5000 mg | ORAL_TABLET | ORAL | 0 refills | Status: DC

## 2019-10-19 MED ORDER — DIPHENHYDRAMINE HCL 50 MG/ML IJ SOLN: 12.5000 mg | INTRAMUSCULAR | Status: DC | PRN

## 2019-10-19 MED ORDER — OLANZAPINE 5 MG PO TBDP
5.0000 mg | ORAL_TABLET | Freq: Every day | ORAL | Status: DC
  Administered 2019-10-19: 5 mg via ORAL
  Filled 2019-10-19 (×3): qty 1

## 2019-10-19 MED ORDER — GLYCOPYRROLATE 0.2 MG/ML IJ SOLN: 0.2000 mg | INTRAMUSCULAR | Status: DC | PRN

## 2019-10-19 MED ORDER — DEXTROSE 50 % IV SOLN
INTRAVENOUS | Status: AC
  Administered 2019-10-19: 11:00:00 50 mL
  Filled 2019-10-19: qty 50

## 2019-10-19 MED ORDER — ALBUTEROL SULFATE (2.5 MG/3ML) 0.083% IN NEBU: 2.5000 mg | INHALATION_SOLUTION | RESPIRATORY_TRACT | Status: DC | PRN

## 2019-10-19 MED ORDER — ENSURE COMPLETE PO LIQD: 237.0000 mL | Freq: Two times a day (BID) | ORAL | 0 refills | Status: DC

## 2019-10-19 MED ORDER — QUETIAPINE FUMARATE 25 MG PO TABS
25.0000 mg | ORAL_TABLET | Freq: Every day | ORAL | Status: DC
  Administered 2019-10-19: 23:00:00 25 mg via ORAL
  Filled 2019-10-19: qty 1

## 2019-10-19 MED ORDER — ATORVASTATIN CALCIUM 20 MG PO TABS: 20.0000 mg | ORAL_TABLET | Freq: Every evening | ORAL | 0 refills | Status: DC

## 2019-10-19 MED ORDER — POLYVINYL ALCOHOL 1.4 % OP SOLN: 1.0000 [drp] | Freq: Four times a day (QID) | OPHTHALMIC | Status: DC | PRN

## 2019-10-19 MED ORDER — ONDANSETRON 4 MG PO TBDP: 4.0000 mg | ORAL_TABLET | Freq: Four times a day (QID) | ORAL | Status: DC | PRN

## 2019-10-19 MED ORDER — ATENOLOL 25 MG PO TABS: 12.5000 mg | ORAL_TABLET | Freq: Every day | ORAL | 1 refills | Status: DC

## 2019-10-19 NOTE — Progress Notes (Signed)
Patient refused dinner despite multiple attempts by staff. Took Lipitor without difficulty with sips of water. Becoming increasingly more agitated and taking off gown. Staff entered room to find patient pulling at foley catheter. Mitts reapplied and PRN Ativan given. Will continue to monitor.

## 2019-10-19 NOTE — Progress Notes (Signed)
SLP Cancellation Note  Patient Details Name: ALIONNA CLINGERMAN MRN: [id number] DOB: 08-04-1924   Cancelled treatment:       Reason Eval/Treat Not Completed: Patient at procedure or test/unavailable; Pt currently with nursing being bathed. SLP will check back as schedule permits for SLP treatment.  Thank you,  Genene Churn, [place]    [place] 10/19/2019, 2:54 PM

## 2019-10-19 NOTE — Progress Notes (Signed)
Hypoglycemic Event  CBG: 10/19/19 0856 53  Treatment: 67mL D50  Symptoms: lethargic, also given Ativan this morning PRN  Follow-up CBG: Time: 0920 CBG Result: 109  Possible Reasons for Event: patient confused, not eating.  Comments/MD notified: Dr. Jodelle Gross

## 2019-10-19 NOTE — Progress Notes (Signed)
OT Cancellation Note  Patient Details Name: Lisa Kemp MRN: [id number] DOB: 02-19-1924   Cancelled Treatment:    Reason Eval/Treat Not Completed: Patient declined, no reason specified;Other (comment)(Cognitive deficits hindered completion of evaluation.)  Attempted to see patient for OT evaluation. In bed sleeping, although able to wake up with verbal cues. Did not show recognition to name. Requested water to drink although refused to eat breakfast when it arrived. Displayed confusion and inability to answer yes/no questions. Stated "too hot. Pull off, legs hot" repeatedly. Blankets were pulled down although patient continued to make repetitive comments. Stated " I need hot water," several times even when therapist attempted to correct and ask if she wanted cold water. With increased time, patient did state, "I need cold water," and it was provided. Unable to complete OT evaluation due to decreased cognition and confusion this AM. Patient did not appear to be oriented and it was determined that an OT evaluation would not be beneficial. Recommend re-attempting evaluation at a later time if cognition and ability to participate improves.      Ailene Ravel, OTR/L,CBIS  [phone]  10/19/2019, 8:40 AM

## 2019-10-19 NOTE — TOC Progression Note (Signed)
Transition of Care [facility]) - Progression Note    Patient Details  Name: CYANNA CARLILE MRN: [id number] Date of Birth: 1923-11-01  Transition of Care [facility]) CM/SW Contact  Boneta Lucks, RN Phone Number: 10/19/2019, 2:32 PM  Clinical Narrative:   Patient from [facility], [place] with [place] at [facility], she was willing to take patient, she did not realize the condition of patient. Normally alert and eating well. Updated that, patient is declining, agitated, not eating or drinking.  Palliative consult recommending Hospice. Sister is consenting to facility.  Referred to [facility], They do not have any beds. Ron is on his way to assess, Updated MD and RN, we can now make patient GIP.    Expected Discharge Plan: [place] Barriers to Discharge: Hospice Bed not available  Expected Discharge Plan and Services Expected Discharge Plan: [place]       Living arrangements for the past 2 months: [place] Expected Discharge Date: 10/19/19                   Readmission Risk Interventions Readmission Risk Prevention Plan 03/05/2019  Transportation Screening Complete  PCP or Specialist Appt within 3-5 Days Complete  HRI or [place] Not Complete  Social Work Consult for [place] Planning/Counseling Not Complete  Palliative Care Screening Not Complete  Medication Review [employer]) Complete  Some recent data might be hidden

## 2019-10-19 NOTE — Progress Notes (Signed)
Patient Demographics:    Lisa Kemp, is a 87 y.o. adult, DOB - [birth year], UZ:[id number]  Admit date - 10/15/2019   Admitting Physician Bethena Roys, MD  Outpatient Primary MD for the patient is Glenda Chroman, MD  LOS - 4  Chief Complaint  Patient presents with   Fall        Subjective:    Lisa Kemp is currently somnolent,  -Recurrent episodes of hypoglycemia due to very very poor oral intake -Requiring IV dextrose solutions repeatedly -Patient alternates from being very lethargic after sedation with medications to being very agitated and requiring sedatives .   Assessment  & Plan :    Principal Problem:   Acute CVA (cerebrovascular accident)/Lt temporal lobe AND Insula Active Problems:   Acute metabolic encephalopathy   Dementia (HCC)   Unspecified atrial fibrillation (HCC)   Essential hypertension, benign   H/o SDH (subdural hematoma) -2015   Hypothyroidism   Brief Summary:- 86 y.o.female with medical history significant for dCHF, CAD, H/o prior SDH , HTN, dementia and  chronic respiratory failure on 3 L at baseline admitted on 10/15/2019 from [facility] after a fall, found to have acute CVA-remains confused -Recurrent episodes of hypoglycemia due to very very poor oral intake -Requiring IV dextrose solutions repeatedly -Patient alternates from being very lethargic after sedation with medications to being very agitated and requiring sedatives --Patient sister requesting transition to comfort care, hospice consult requested  A/p 1) acute CVA of left temporal lobe and insular --cranial imaging studies including CT head, MRI brain, MRA head noted -A1c 5.6,  -Echo with EF of 55 to 60% with grade 1 diastolic dysfunction -No regional wall motion abnormalities -PTA patient was on aspirin 81 mg daily, treat empirically with aspirin and Plavix combo for 3 weeks then  transition to Plavix monotherapy after 3 weeks  -History of statin intolerance, LDL 105, treat empirically with Lipitor 20 mg daily -PT and speech therapy eval appreciated recommends SNF rehab  2) acute metabolic encephalopathy superimposed on underlying dementia--- most likely due to acute stroke as above #1, apparently recently treated for UTI with Augmentin, --Current UA does not show any signs of infection -At baseline apparently patient has difficulties with mobility related ADLs, gets around with wheelchair at ALF -She has been receiving Ativan as needed, but is either somnolent or agitated -We will start on Seroquel nightly and use Haldol as needed during the day. -If mental status does not improve with improved p.o. intake, may need to consider palliative care consult. -Acute stroke with severe metabolic encephalopathy superimposed on underlying dementia with recurrent episodes of hypoglycemia due to poor oral intake associated with lethargy and alternating severe agitation--- overall prognosis is poor --transfer to hospice house when bed available    3)HTN--allow permissive hypertension in the setting of acute stroke, hold lisinopril and Lasix until 10/18/2019  4) hypothyroidism--- continue levothyroxine 175 mcg daily, TSH 0.2  5) status post fall--- may be secondary to #1 above, PT eval appreciated, no acute fractures on imaging studies, recommends SNF rehab--transfer to hospice house when bed available    6) hyperkalemia--- most likely due to concomitant lisinopril use as well as potassium replacements PTA -Lisinopril and potassium has been discontinued, repeat potassium down to  4.9from 5.8  7)New onset atrial fibrillation --- most likely paroxysmal A. Fib -TSH pending, potassium has normalized, echo with EF of 55 to 60% HASBLED Score = 4  CHA2DS2- VASc score   is = 7    Which is  equal to = 11.2% annual risk of stroke  --Discussed risk Versus benefit of FULL anticoagulation  with patient's sister [name]-- [phone] sister declined for anticoagulation given increased's HASBLED score and history of falls -Continue antiplatelet therapy as above #1  8) chronic anemia--- suspect partly nutritional, hemoglobin currently 8.2, her baseline is around 10, no evidence of ongoing bleeding -Monitor closely for possible bleeding especially with DAPT  9) urinary retention.  Noted by staff to have 450 cc retained urine on bladder scan.  She underwent in and out catheterization.  10) acute respiratory failure with hypoxia.  Patient requiring 3 to 4 L of oxygen.  When oxygen is removed, she does drop down into the 80s.  She is not coughing or febrile.  She does not appear to be volume overloaded.  Suspect that she may have hypoventilation and atelectasis.  Will check chest x-ray.  Wean off oxygen as tolerated.  11)FEN--recurrent hypoglycemia-- -Recurrent episodes of hypoglycemia due to very very poor oral intake -Requiring IV dextrose solutions repeatedly -Patient alternates from being very lethargic after sedation with medications to being very agitated and requiring sedatives --Patient sister requesting transition to comfort care, hospice consult requested  Disposition/Need for in-Hospital Stay- patient unable to be discharged at this time due to --- -transfer to hospice house when bed available  Code Status : DNR  Family Communication:  Discussed with [name]-- [phone] sister who is her POA ---Patient sister requesting transition to comfort care, hospice consult requested  Consults  : Hospice  DVT Prophylaxis  :   -   SCDs   Lab Results  Component Value Date   PLT 152 10/18/2019    Inpatient Medications  Scheduled Meds:  aspirin  81 mg Oral Daily   atorvastatin  20 mg Oral q1800   Chlorhexidine Gluconate Cloth  6 each Topical Daily   clopidogrel  75 mg Oral Daily   enoxaparin (LOVENOX) injection  30 mg  Subcutaneous Q24H   levothyroxine  175 mcg Oral Daily   lisinopril  5 mg Oral Daily   pantoprazole  40 mg Oral Daily   QUEtiapine  100 mg Oral QHS   Continuous Infusions: PRN Meds:.acetaminophen **OR** acetaminophen, HYDROcodone-acetaminophen, LORazepam, ondansetron **OR** ondansetron (ZOFRAN) IV, polyethylene glycol   Anti-infectives (From admission, onward)   None        Objective:   Vitals:   10/18/19 2030 10/19/19 0007 10/19/19 0407 10/19/19 1205  BP: (!) 134/41 (!) 124/54 106/64 (!) 129/57  Pulse: 86 86 94 89  Resp: 16 20 18 18   Temp: 97.8 F (36.6 C) 98.2 F (36.8 C) 98.5 F (36.9 C) 98.6 F (37 C)  TempSrc: Axillary Axillary Axillary Axillary  SpO2: 98% 97% 100% 97%  Weight:      Height:        Wt Readings from Last 3 Encounters:  10/15/19 74 kg  03/06/19 76.9 kg  02/12/19 67.5 kg     Intake/Output Summary (Last 24 hours) at 10/19/2019 1524 Last data filed at 10/19/2019 1400 Gross per 24 hour  Intake 240 ml  Output 450 ml  Net -210 ml     Physical Exam  General exam: Somnolent, recently medicated Respiratory system: Clear to auscultation. Respiratory effort normal. Cardiovascular  system:RRR. No murmurs, rubs, gallops. Gastrointestinal system: Abdomen is nondistended, soft and nontender. No organomegaly or masses felt. Normal bowel sounds heard. Central nervous system: No focal neurological deficits. Extremities: No C/C/E, +pedal pulses Skin: No rashes, lesions or ulcers Psychiatry: Somnolent.     Data Review:   Micro Results Recent Results (from the past 240 hour(s))  SARS CORONAVIRUS 2 (TAT 6-24 HRS) Nasopharyngeal Nasopharyngeal Swab     Status: None   Collection Time: 10/15/19  7:40 PM   Specimen: Nasopharyngeal Swab  Result Value Ref Range Status   SARS Coronavirus 2 NEGATIVE NEGATIVE Final    Comment: (NOTE) SARS-CoV-2 target nucleic acids are NOT DETECTED. The SARS-CoV-2 RNA is generally detectable in upper and lower respiratory  specimens during the acute phase of infection. Negative results do not preclude SARS-CoV-2 infection, do not rule out co-infections with other pathogens, and should not be used as the sole basis for treatment or other patient management decisions. Negative results must be combined with clinical observations, patient history, and epidemiological information. The expected result is Negative. Fact Sheet for Patients: SugarRoll.be Fact Sheet for Healthcare Providers: https://www.woods-mathews.com/ This test is not yet approved or cleared by the [place] FDA and  has been authorized for detection and/or diagnosis of SARS-CoV-2 by FDA under an Emergency Use Authorization (EUA). This EUA will remain  in effect (meaning this test can be used) for the duration of the COVID-19 declaration under Section 56 4(b)(1) of the Act, 21 U.S.C. section 360bbb-3(b)(1), unless the authorization is terminated or revoked sooner. Performed at [facility], [place] [address], [place], [place] [address]   Urine Culture     Status: Abnormal   Collection Time: 10/17/19  7:53 AM   Specimen: Urine, Clean Catch  Result Value Ref Range Status   Specimen Description   Final    URINE, CLEAN CATCH Performed at [facility], [address], [place], [place] [address]    Special Requests   Final    Normal Performed at [facility], [address], [place], [place] [address]    Culture 20,000 COLONIES/mL Su Hoff (A)  Final   Report Status 10/19/2019 FINAL  Final   Organism ID, Bacteria CITROBACTER YOUNGAE (A)  Final      Susceptibility   Citrobacter youngae - MIC*    CEFAZOLIN >=64 RESISTANT Resistant     CEFTRIAXONE >=64 RESISTANT Resistant     CIPROFLOXACIN <=0.25 SENSITIVE Sensitive     GENTAMICIN <=1 SENSITIVE Sensitive     IMIPENEM 0.5 SENSITIVE Sensitive     NITROFURANTOIN <=16 SENSITIVE Sensitive     TRIMETH/SULFA <=20 SENSITIVE Sensitive      PIP/TAZO >=128 RESISTANT Resistant     * 20,000 COLONIES/mL CITROBACTER YOUNGAE    Radiology Reports DG Chest 1 View  Result Date: 10/15/2019 CLINICAL DATA:  86 year old female with fall.  Shortness of breath. EXAM: CHEST  1 VIEW COMPARISON:  Chest radiograph dated 03/04/2019. FINDINGS: A crescentic airspace opacity at the medial right lung base, likely atelectasis secondary to a moderate-sized hiatal hernia. The lungs are otherwise clear. There is no pleural effusion or pneumothorax. Mild cardiomegaly. Atherosclerotic calcification of the aorta. No acute osseous pathology. Osteopenia. IMPRESSION: Probable area of atelectasis in the right infrahilar region secondary to a moderate size hiatal hernia. No definite acute cardiopulmonary process. Electronically Signed   By: Anner Crete M.D.   On: 10/15/2019 18:01   DG Pelvis 1-2 Views  Result Date: 10/15/2019 CLINICAL DATA:  86 year old female with fall. EXAM:  PELVIS - 1-2 VIEW COMPARISON:  None. FINDINGS: There is no acute fracture or dislocation. The bones are osteopenic. There is degenerative changes of the visualized lower lumbar spine. Multiple surgical clips noted over the pelvis. The soft tissues are unremarkable. Small radiopaque foci over the lower abdomen bilaterally may represent fecalith versus renal calculi. Vascular calcifications noted. IMPRESSION: No acute fracture or dislocation. Electronically Signed   By: Anner Crete M.D.   On: 10/15/2019 18:03   DG Tibia/Fibula Right  Result Date: 10/15/2019 CLINICAL DATA:  86 year old female status post fall with right lower leg skin tear. EXAM: RIGHT TIBIA AND FIBULA - 2 VIEW COMPARISON:  None. FINDINGS: Calcified peripheral vascular disease. Generalized soft tissue stranding. No soft tissue gas identified. No radiopaque foreign body identified. No acute osseous abnormality identified. Severe lateral compartment joint degeneration at the right knee. IMPRESSION: 1. No acute osseous  abnormality identified about the right tib-fib. Severe lateral compartment right knee joint degeneration. 2. Calcified peripheral vascular disease. Generalized soft tissue stranding. Electronically Signed   By: Genevie Ann M.D.   On: 10/15/2019 18:03   CT Head Wo Contrast  Result Date: 10/15/2019 CLINICAL DATA:  Head trauma, headache. Additional history provided: Unwitnessed fall, increased confusion this week, recent diagnosis of UTI, history of hypertension, dementia, intracranial hemorrhage EXAM: CT HEAD WITHOUT CONTRAST TECHNIQUE: Contiguous axial images were obtained from the base of the skull through the vertex without intravenous contrast. COMPARISON:  Head CT 03/22/2015. FINDINGS: Brain: There is no evidence of acute intracranial hemorrhage. No midline shift or extra-axial fluid collection. There is a region of abnormal hypodensity within the left temporal lobe measuring 6.2 x 2.0 x 1.8 cm (AP x TV x CC) (series 2, image 15) (series 4, image 48). This abnormal hypodensity appears to predominantly affect the white matter. Mild for age ill-defined hypoattenuation within the remainder of the cerebral white matter is nonspecific, but consistent with chronic small vessel ischemic disease. Mild generalized parenchymal atrophy. Redemonstrated nonspecific irregular dural-based calcifications along right frontal convexity. Vascular: No hyperdense vessel. Atherosclerotic calcifications. Skull: No calvarial fracture or aggressive osseous lesion. Sinuses/Orbits: Visualized orbits demonstrate no acute abnormality. No significant paranasal sinus disease or mastoid effusion. Other: Right parietal scalp soft tissue swelling These results were called by telephone at the time of interpretation on 10/15/2019 at 5:51 pm to provider [facility] , who verbally acknowledged these results. IMPRESSION: 6.2 cm region of abnormal hypodensity within the left temporal lobe predominantly affecting the white matter. This finding is new  from CT 03/22/2015 and indeterminate. Primary differential considerations include edema related to a primary or metastatic neoplasm versus an infectious/inflammatory process. Recent ischemic infarction is also a consideration, although this would be an atypical appearance. Contrast-enhanced brain MRI is recommended for further evaluation. Background mild generalized parenchymal atrophy and chronic small vessel ischemic disease. No evidence of acute intracranial hemorrhage. Right parietal scalp soft tissue swelling. Electronically Signed   By: Kellie Simmering DO   On: 10/15/2019 17:51   MR ANGIO HEAD WO CONTRAST  Result Date: 10/15/2019 CLINICAL DATA:  [place].  Left temporal abnormality. EXAM: MRA HEAD WITHOUT CONTRAST TECHNIQUE: Angiographic images of the Circle of Willis were obtained using MRA technique without intravenous contrast. COMPARISON:  Brain study same day FINDINGS: The study is limited by motion degradation. Both internal carotid arteries are patent through the skull base and siphon regions. There is flow in the anterior and middle cerebral arteries. Distal vessels cannot be evaluated. Both vertebral arteries are patent to the basilar. The  basilar artery shows flow. Both superior cerebellar arteries and posterior cerebral arteries show flow. IMPRESSION: Limited and motion degraded exam. Flow within the major vessels at the base of the brain. Distal vessel information not reliable because of pronounced motion. Electronically Signed   By: Nelson Chimes M.D.   On: 10/15/2019 19:20   MR BRAIN WO CONTRAST  Result Date: 10/15/2019 CLINICAL DATA:  Encephalopathy. Unwitnessed fall with trauma to the head. EXAM: MRI HEAD WITHOUT CONTRAST TECHNIQUE: Multiplanar, multiecho pulse sequences of the brain and surrounding structures were obtained without intravenous contrast. COMPARISON:  Head CT same day.  Head CT 03/22/2015. FINDINGS: MRI HEAD FINDINGS The study is limited and abbreviated by patient motion. Brain:  There is acute/subacute infarction affecting the left temporal lobe, with minor involvement of the insular brain. The study is markedly degraded by motion, but I do not have significant suspicion regarding tumor or cerebritis. No gross hemorrhage. No significant mass effect or shift. Elsewhere, there chronic small-vessel ischemic changes of the white matter. Vascular: No vascular information available. Skull and upper cervical spine: No abnormality seen. Sinuses/Orbits: Negative Other: None IMPRESSION: Acute to early subacute infarction affecting the left temporal lobe, with minor involvement of the adjacent insular brain. The study is limited and abbreviated by patient motion. I do not have any significant suspicion regarding tumor or cerebritis. Electronically Signed   By: Nelson Chimes M.D.   On: 10/15/2019 19:17   US Carotid Bilateral  Result Date: 10/17/2019 Griffin Basil, RT     10/17/2019 12:31 PM Patient refused Carotid Ultrasound . vmm  DG CHEST PORT 1 VIEW  Result Date: 10/18/2019 CLINICAL DATA:  Respiratory failure, history of CHF EXAM: PORTABLE CHEST 1 VIEW COMPARISON:  10/15/2019 FINDINGS: Single frontal view of the chest demonstrates a stable cardiac silhouette. No airspace disease, effusion, or pneumothorax. The cardiac silhouette is stable. Hiatal hernia unchanged. No acute bony abnormalities. IMPRESSION: 1. Stable hiatal hernia.  No acute airspace disease. Electronically Signed   By: Randa Ngo M.D.   On: 10/18/2019 19:40   ECHOCARDIOGRAM COMPLETE  Result Date: 10/16/2019    ECHOCARDIOGRAM REPORT   Patient Name:   DASIYA HELDER Date of Exam: 10/16/2019 Medical Rec #:  [id number]           Height:       65.0 in Accession #:    [id number]          Weight:       163.1 lb Date of Birth:  August 14, 1924           BSA:          1.81 m Patient Age:    54 years            BP:           119/55 mmHg Patient Gender: F                   HR:           82 bpm. Exam Location:  [facility]  Procedure: 2D Echo Indications:    Abnormal ECG 794.31 / R94.31  History:        Patient has prior history of Echocardiogram examinations, most                 recent 03/05/2019. CHF, CAD, Stroke, Arrythmias:Atrial                 Fibrillation; Risk Factors:Hypertension, Non-Smoker and  Dyslipidemia. GERD, Elevated troponin.  Sonographer:    Leavy Cella RDCS (AE) Referring Phys: [place]  1. Left ventricular ejection fraction, by estimation, is 55 to 60%. The left ventricle has normal function. The left ventricle has no regional wall motion abnormalities. There is mild left ventricular hypertrophy. Left ventricular diastolic parameters are consistent with Grade I diastolic dysfunction (impaired relaxation).  2. Right ventricular systolic function is normal. The right ventricular size is normal. There is moderately elevated pulmonary artery systolic pressure. The estimated right ventricular systolic pressure is Q000111Q mmHg.  3. Left atrial size was mildly dilated.  4. The mitral valve is grossly normal. Mild mitral valve regurgitation.  5. Tricuspid valve regurgitation is mild to moderate.  6. The aortic valve is tricuspid. Aortic valve regurgitation is trivial.  7. The inferior vena cava is normal in size with greater than 50% respiratory variability, suggesting right atrial pressure of 3 mmHg. FINDINGS  Left Ventricle: Left ventricular ejection fraction, by estimation, is 55 to 60%. The left ventricle has normal function. The left ventricle has no regional wall motion abnormalities. The left ventricular internal cavity size was normal in size. There is  mild left ventricular hypertrophy. Left ventricular diastolic parameters are consistent with Grade I diastolic dysfunction (impaired relaxation). Right Ventricle: The right ventricular size is normal. No increase in right ventricular wall thickness. Right ventricular systolic function is normal. There is moderately  elevated pulmonary artery systolic pressure. The tricuspid regurgitant velocity is 3.41 m/s, and with an assumed right atrial pressure of 3 mmHg, the estimated right ventricular systolic pressure is Q000111Q mmHg. Left Atrium: Left atrial size was mildly dilated. Right Atrium: Right atrial size was normal in size. Pericardium: There is no evidence of pericardial effusion. Mitral Valve: The mitral valve is grossly normal. Mild mitral valve regurgitation. Tricuspid Valve: The tricuspid valve is grossly normal. Tricuspid valve regurgitation is mild to moderate. Aortic Valve: The aortic valve is tricuspid. Aortic valve regurgitation is trivial. Aortic regurgitation PHT measures 297 msec. Moderate aortic valve annular calcification. There is mild calcification of the aortic valve. Pulmonic Valve: The pulmonic valve was grossly normal. Pulmonic valve regurgitation is not visualized. Aorta: The aortic root is normal in size and structure. Venous: The inferior vena cava is normal in size with greater than 50% respiratory variability, suggesting right atrial pressure of 3 mmHg. IAS/Shunts: No atrial level shunt detected by color flow Doppler.  LEFT VENTRICLE PLAX 2D LVIDd:         4.11 cm  Diastology LVIDs:         3.13 cm  LV e' lateral:   5.87 cm/s LV PW:         1.10 cm  LV E/e' lateral: 19.4 LV IVS:        1.33 cm  LV e' medial:    10.10 cm/s LVOT diam:     2.00 cm  LV E/e' medial:  11.3 LV SV Index:   19.27 LVOT Area:     3.14 cm  RIGHT VENTRICLE RV S prime:     8.81 cm/s TAPSE (M-mode): 2.6 cm LEFT ATRIUM             Index       RIGHT ATRIUM           Index LA diam:        3.70 cm 2.04 cm/m  RA Area:     14.20 cm LA Vol (A2C):   82.8 ml 45.64 ml/m RA Volume:  34.10 ml  18.80 ml/m LA Vol (A4C):   54.0 ml 29.77 ml/m LA Biplane Vol: 68.9 ml 37.98 ml/m  AORTIC VALVE AI PHT:      297 msec  AORTA Ao Root diam: 3.00 cm MITRAL VALVE                TRICUSPID VALVE MV Area (PHT): 5.13 cm     TR Peak grad:   46.5 mmHg MV  Decel Time: 148 msec     TR Vmax:        341.00 cm/s MR Peak grad: 130.0 mmHg MR Mean grad: 95.0 mmHg     SHUNTS MR Vmax:      570.00 cm/s   Systemic Diam: 2.00 cm MR Vmean:     463.0 cm/s MV E velocity: 114.00 cm/s MV A velocity: 117.00 cm/s MV E/A ratio:  0.97 Rozann Lesches MD Electronically signed by Rozann Lesches MD Signature Date/Time: 10/16/2019/11:40:24 AM    Final      CBC Recent Labs  Lab 10/15/19 1656 10/16/19 0517 10/18/19 0856  WBC 6.4 7.0 5.2  HGB 9.4* 9.2* 8.2*  HCT 30.5* 29.7* 26.5*  PLT 184 198 152  MCV 98.7 96.7 97.1  MCH 30.4 30.0 30.0  MCHC 30.8 31.0 30.9  RDW 13.1 13.0 13.3  LYMPHSABS 1.2  --   --   MONOABS 0.6  --   --   EOSABS 0.2  --   --   BASOSABS 0.1  --   --     Chemistries  Recent Labs  Lab 10/15/19 1656 10/16/19 0517 10/18/19 0856 10/19/19 0511  NA 136 136 137 141  K 5.8* 5.1 4.1 4.4  CL 89* 89* 92* 96*  CO2 38* 36* 34* 31  GLUCOSE 100* 95 74 57*  BUN 44* 42* 31* 42*  CREATININE 1.04* 0.93 1.09* 1.27*  CALCIUM 10.0 10.1 9.3 9.3   ------------------------------------------------------------------------------------------------------------------ No results for input(s): CHOL, HDL, LDLCALC, TRIG, CHOLHDL, LDLDIRECT in the last 72 hours.  Lab Results  Component Value Date   HGBA1C 5.6 10/15/2019   ------------------------------------------------------------------------------------------------------------------ No results for input(s): TSH, T4TOTAL, T3FREE, THYROIDAB in the last 72 hours.  Invalid input(s): FREET3 ------------------------------------------------------------------------------------------------------------------ No results for input(s): VITAMINB12, FOLATE, FERRITIN, TIBC, IRON, RETICCTPCT in the last 72 hours.  Coagulation profile No results for input(s): INR, PROTIME in the last 168 hours.  No results for input(s): DDIMER in the last 72 hours.  Cardiac Enzymes No results for input(s): CKMB, TROPONINI, MYOGLOBIN in  the last 168 hours.  Invalid input(s): CK ------------------------------------------------------------------------------------------------------------------    Component Value Date/Time   BNP 260.0 (H) 10/15/2019 1656     Roxan Hockey M.D on 10/19/2019 at 3:24 PM  Go to [email or web address] - for contact info  [facility] - Office  [phone]

## 2019-10-19 NOTE — Progress Notes (Signed)
CBG this morning was 53, increased to 109 after 69mL D50. Patient has not eat or drink anything despite staff encouragement. Patient stated "I just want water." CBG at 1117 was 73, Dr. Denton Brick notified. 92mL D50 given. Attempted to give patient Ensure and patient refused and stated "leave me alone." Bladder scan revealed 320-358mL of urine. Dr. Denton Brick notified. O2 sats on room air at rest 90%. Patient alert, remains disoriented x4. Unable to complete NIH assessment. Will continue to monitor.

## 2019-10-19 NOTE — Care Management Important Message (Signed)
Important Message  Patient Details  Name: Lisa Kemp MRN: [id number] Date of Birth: 1924-04-06   Medicare Important Message Given:  Yes(copy mailed to [place], [place], [place] [address])     Tommy Medal 10/19/2019, 10:04 AM

## 2019-10-19 NOTE — Discharge Instructions (Signed)
1)You have new/acute stroke ----take Aspirin 81 mg daily along with Plavix 75 mg daily for 3 weeks, then after 3 weeks take only Plavix 75 mg daily without any further Aspirin therapy 2)Avoid ibuprofen/Advil/Aleve/Motrin/Goody Powders/Naproxen/BC powders/Meloxicam/Diclofenac/Indomethacin and other Nonsteroidal anti-inflammatory medications as these will make you more likely to bleed and can cause stomach ulcers, can also cause Kidney problems.  3)You are at risk for low blood sugar due to poor oral intake--- drink Ensure 1 can at least twice a day and take Remeron at bedtime for appetite stimulation 4)Physical Therapy as advised 5)Please check Accu-Cheks/fingerstick glucose 3 times a day

## 2019-10-20 ENCOUNTER — Inpatient Hospital Stay (HOSPITAL_COMMUNITY)
Admission: RE | Admit: 2019-10-20 | Discharge: 2019-10-21 | DRG: 951 | Disposition: A | Discharge status: Hospice | Source: Ambulatory Visit | Attending: Family Medicine | Admitting: Family Medicine

## 2019-10-20 DIAGNOSIS — Z8249 Family history of ischemic heart disease and other diseases of the circulatory system: Secondary | ICD-10-CM

## 2019-10-20 DIAGNOSIS — I5030 Unspecified diastolic (congestive) heart failure: Secondary | ICD-10-CM | POA: Diagnosis present

## 2019-10-20 DIAGNOSIS — F0390 Unspecified dementia without behavioral disturbance: Secondary | ICD-10-CM | POA: Diagnosis present

## 2019-10-20 DIAGNOSIS — I4891 Unspecified atrial fibrillation: Secondary | ICD-10-CM | POA: Diagnosis present

## 2019-10-20 DIAGNOSIS — Z993 Dependence on wheelchair: Secondary | ICD-10-CM | POA: Diagnosis not present

## 2019-10-20 DIAGNOSIS — I480 Paroxysmal atrial fibrillation: Secondary | ICD-10-CM | POA: Diagnosis present

## 2019-10-20 DIAGNOSIS — Z90722 Acquired absence of ovaries, bilateral: Secondary | ICD-10-CM | POA: Diagnosis not present

## 2019-10-20 DIAGNOSIS — Z66 Do not resuscitate: Secondary | ICD-10-CM | POA: Diagnosis present

## 2019-10-20 DIAGNOSIS — Z8673 Personal history of transient ischemic attack (TIA), and cerebral infarction without residual deficits: Secondary | ICD-10-CM | POA: Diagnosis not present

## 2019-10-20 DIAGNOSIS — Z79890 Hormone replacement therapy (postmenopausal): Secondary | ICD-10-CM

## 2019-10-20 DIAGNOSIS — E875 Hyperkalemia: Secondary | ICD-10-CM | POA: Diagnosis present

## 2019-10-20 DIAGNOSIS — R339 Retention of urine, unspecified: Secondary | ICD-10-CM | POA: Diagnosis present

## 2019-10-20 DIAGNOSIS — G9341 Metabolic encephalopathy: Secondary | ICD-10-CM | POA: Diagnosis present

## 2019-10-20 DIAGNOSIS — J9621 Acute and chronic respiratory failure with hypoxia: Secondary | ICD-10-CM | POA: Diagnosis present

## 2019-10-20 DIAGNOSIS — M1990 Unspecified osteoarthritis, unspecified site: Secondary | ICD-10-CM | POA: Diagnosis present

## 2019-10-20 DIAGNOSIS — I110 Hypertensive heart disease with heart failure: Secondary | ICD-10-CM | POA: Diagnosis present

## 2019-10-20 DIAGNOSIS — I2510 Atherosclerotic heart disease of native coronary artery without angina pectoris: Secondary | ICD-10-CM | POA: Diagnosis present

## 2019-10-20 DIAGNOSIS — Z79899 Other long term (current) drug therapy: Secondary | ICD-10-CM

## 2019-10-20 DIAGNOSIS — I6389 Other cerebral infarction: Secondary | ICD-10-CM | POA: Diagnosis present

## 2019-10-20 DIAGNOSIS — Z86010 Personal history of colonic polyps: Secondary | ICD-10-CM

## 2019-10-20 DIAGNOSIS — W19XXXA Unspecified fall, initial encounter: Secondary | ICD-10-CM | POA: Diagnosis present

## 2019-10-20 DIAGNOSIS — K219 Gastro-esophageal reflux disease without esophagitis: Secondary | ICD-10-CM | POA: Diagnosis present

## 2019-10-20 DIAGNOSIS — E039 Hypothyroidism, unspecified: Secondary | ICD-10-CM | POA: Diagnosis present

## 2019-10-20 DIAGNOSIS — Z515 Encounter for palliative care: Principal | ICD-10-CM | POA: Diagnosis present

## 2019-10-20 DIAGNOSIS — Z90710 Acquired absence of both cervix and uterus: Secondary | ICD-10-CM | POA: Diagnosis not present

## 2019-10-20 DIAGNOSIS — G939 Disorder of brain, unspecified: Secondary | ICD-10-CM | POA: Diagnosis not present

## 2019-10-20 DIAGNOSIS — Z87440 Personal history of urinary (tract) infections: Secondary | ICD-10-CM

## 2019-10-20 DIAGNOSIS — D6489 Other specified anemias: Secondary | ICD-10-CM | POA: Diagnosis present

## 2019-10-20 DIAGNOSIS — I639 Cerebral infarction, unspecified: Secondary | ICD-10-CM | POA: Diagnosis present

## 2019-10-20 DIAGNOSIS — I10 Essential (primary) hypertension: Secondary | ICD-10-CM | POA: Diagnosis present

## 2019-10-20 DIAGNOSIS — F0391 Unspecified dementia with behavioral disturbance: Secondary | ICD-10-CM | POA: Diagnosis not present

## 2019-10-20 DIAGNOSIS — E782 Mixed hyperlipidemia: Secondary | ICD-10-CM | POA: Diagnosis present

## 2019-10-20 DIAGNOSIS — E162 Hypoglycemia, unspecified: Secondary | ICD-10-CM | POA: Diagnosis present

## 2019-10-20 DIAGNOSIS — J9690 Respiratory failure, unspecified, unspecified whether with hypoxia or hypercapnia: Secondary | ICD-10-CM | POA: Diagnosis not present

## 2019-10-20 DIAGNOSIS — Z7982 Long term (current) use of aspirin: Secondary | ICD-10-CM

## 2019-10-20 DIAGNOSIS — S065X9A Traumatic subdural hemorrhage with loss of consciousness of unspecified duration, initial encounter: Secondary | ICD-10-CM | POA: Diagnosis present

## 2019-10-20 DIAGNOSIS — S065XAA Traumatic subdural hemorrhage with loss of consciousness status unknown, initial encounter: Secondary | ICD-10-CM | POA: Diagnosis present

## 2019-10-20 MED ORDER — OLANZAPINE 5 MG PO TBDP
5.0000 mg | ORAL_TABLET | Freq: Every day | ORAL | Status: DC
  Administered 2019-10-20: 5 mg via ORAL
  Filled 2019-10-20 (×3): qty 1

## 2019-10-20 MED ORDER — LORAZEPAM 2 MG/ML IJ SOLN
1.0000 mg | INTRAMUSCULAR | Status: DC | PRN
  Administered 2019-10-20: 1 mg via INTRAVENOUS
  Filled 2019-10-20: qty 1

## 2019-10-20 MED ORDER — MIRTAZAPINE 15 MG PO TABS: 15.0000 mg | ORAL_TABLET | Freq: Every day | ORAL | Status: DC

## 2019-10-20 MED ORDER — GLYCOPYRROLATE 0.2 MG/ML IJ SOLN: 0.2000 mg | INTRAMUSCULAR | Status: DC | PRN

## 2019-10-20 MED ORDER — ENSURE ENLIVE PO LIQD: 237.0000 mL | Freq: Three times a day (TID) | ORAL | Status: DC

## 2019-10-20 MED ORDER — GLYCOPYRROLATE 1 MG PO TABS: 1.0000 mg | ORAL_TABLET | ORAL | Status: DC | PRN

## 2019-10-20 MED ORDER — ONDANSETRON HCL 4 MG/2ML IJ SOLN: 4.0000 mg | Freq: Four times a day (QID) | INTRAMUSCULAR | Status: DC | PRN

## 2019-10-20 MED ORDER — BIOTENE DRY MOUTH MT LIQD: 15.0000 mL | OROMUCOSAL | Status: DC | PRN

## 2019-10-20 MED ORDER — MORPHINE SULFATE (CONCENTRATE) 10 MG/0.5ML PO SOLN: 5.0000 mg | ORAL | Status: DC | PRN

## 2019-10-20 MED ORDER — DIPHENHYDRAMINE HCL 50 MG/ML IJ SOLN: 12.5000 mg | INTRAMUSCULAR | Status: DC | PRN

## 2019-10-20 MED ORDER — ACETAMINOPHEN 325 MG PO TABS: 650.0000 mg | ORAL_TABLET | Freq: Four times a day (QID) | ORAL | Status: DC | PRN

## 2019-10-20 MED ORDER — FENTANYL CITRATE (PF) 100 MCG/2ML IJ SOLN: 25.0000 ug | INTRAMUSCULAR | Status: DC | PRN

## 2019-10-20 MED ORDER — BISACODYL 10 MG RE SUPP: 10.0000 mg | Freq: Every day | RECTAL | Status: DC | PRN

## 2019-10-20 MED ORDER — ONDANSETRON 4 MG PO TBDP: 4.0000 mg | ORAL_TABLET | Freq: Four times a day (QID) | ORAL | Status: DC | PRN

## 2019-10-20 MED ORDER — ALBUTEROL SULFATE (2.5 MG/3ML) 0.083% IN NEBU: 2.5000 mg | INHALATION_SOLUTION | RESPIRATORY_TRACT | Status: DC | PRN

## 2019-10-20 MED ORDER — ACETAMINOPHEN 650 MG RE SUPP: 650.0000 mg | Freq: Four times a day (QID) | RECTAL | Status: DC | PRN

## 2019-10-20 MED ORDER — POLYVINYL ALCOHOL 1.4 % OP SOLN: 1.0000 [drp] | Freq: Four times a day (QID) | OPHTHALMIC | Status: DC | PRN

## 2019-10-20 NOTE — Plan of Care (Signed)
  Problem: Education: ?Goal: Knowledge of General Education information will improve ?Description: Including pain rating scale, medication(s)/side effects and non-pharmacologic comfort measures ?Outcome: Not Progressing ?  ?Problem: Health Behavior/Discharge Planning: ?Goal: Ability to manage health-related needs will improve ?Outcome: Not Progressing ?  ?Problem: Nutrition: ?Goal: Adequate nutrition will be maintained ?Outcome: Not Progressing ?  ?Problem: Pain Managment: ?Goal: General experience of comfort will improve ?Outcome: Not Progressing ?  ?

## 2019-10-20 NOTE — Progress Notes (Signed)
OT Cancellation Note  Patient Details Name: KINZEY MALENA MRN: [id number] DOB: [birth year]   Cancelled Treatment:    Reason Eval/Treat Not Completed: Medical issues which prohibited therapy;Other (comment). Per chart review pt will transition to hospice care when bed is available. OT will sign off.    Guadelupe Sabin, OTR/L  [phone] 10/20/2019, 7:18 AM

## 2019-10-20 NOTE — H&P (Addendum)
Patient Demographics:    Lisa Kemp, is a 86 y.o. adult  MRN: [id number]   DOB - [birth year]  Admit Date - 10/20/2019  Outpatient Primary MD for the patient is Glenda Chroman, MD   Assessment & Plan:    Principal Problem:   End of life care Active Problems:   Acute CVA (cerebrovascular accident)/Lt temporal lobe AND Insula   Acute metabolic encephalopathy   Palliative care encounter   Dementia [facility])   Unspecified atrial fibrillation ([place])   Essential hypertension, benign   H/o SDH (subdural hematoma) -2015   Hypothyroidism    Brief Summary:- 86 y.o.femalewith medical history significant fordCHF, CAD, H/o prior SDH , HTN, dementia and  chronic respiratory failure on 3 L at baseline admitted on 10/15/2019 from [facility] after a fall, found to have acute CVA-remains confused -Recurrent episodes of hypoglycemia due to very very poor oral intake -Requiring IV dextrose solutions repeatedly -Patient alternates from being very lethargic after sedation with medications to being very agitated and requiring sedatives --Patient sister requesting transition to comfort care, hospice consult requested  A/p 1) acute CVA of left temporal lobe and insular --cranial imaging studies including CT head, MRI brain, MRA head noted -A1c 5.6,  -Echo with EF of 55 to 60% with grade 1 diastolic dysfunction -No regional wall motion abnormalities -PTA patient was on aspirin 81 mg daily, treated with Plavix, -History of statin intolerance, LDL 105, treated with Lipitor 20 mg daily -PT and speech therapy eval appreciated recommends SNF rehab -At this time --Patient sister requesting transition to comfort care, and transfer to hospice house when bed available  2) acute metabolic encephalopathy superimposed on underlying  dementia--- most likely due to acute stroke as above #1, apparently recently treated for UTI with Augmentin, --Current UA does not show any signs of infection -At baseline apparently patient has difficulties with mobility related ADLs, gets around with wheelchair at ALF -She has been receiving Ativan as needed, but is either somnolent or agitated -We will start on Seroquel nightly and use Haldol as needed during the day. -If mental status does not improve with improved p.o. intake, may need to consider palliative care consult. -Acute stroke with severe metabolic encephalopathy superimposed on underlying dementia with recurrent episodes of hypoglycemia due to poor oral intake associated with lethargy and alternating severe agitation--- overall prognosis is poor --transfer to hospice house when bed available   3)HTN--allow permissive hypertension in the setting of acute stroke, hold lisinopril and Lasix   4) hypothyroidism--- treated with levothyroxine  TSH 0.2  5) status post fall--- may be secondary to #1 above, PT eval appreciated, no acute fractures on imaging studies, recommends SNF rehab--transfer to hospice house when bed available  6) hyperkalemia--- most likely due to concomitant lisinopril use as well as potassium replacements PTA -Lisinopril and potassium has been discontinued,  7)New onset atrial fibrillation --- most likely paroxysmal A. Fib , potassium has normalized, echo with  EF of 55 to 60% HASBLED Score = 4 CHA2DS2- VASc score   is = 7    Which is  equal to = 11.2% annual risk of stroke  --Discussed risk Versus benefit of FULL anticoagulation with patient's sister [name]-- [phone] sister declined for anticoagulation given increased's HASBLED score and history of falls Treated with aspirin and Plavix, transition to comfort care at this time  8) chronic anemia--- suspect partly nutritional, hemoglobin currently 8.2, her baseline is around  10, no evidence of ongoing bleeding   9) urinary retention.  Noted by staff to have 450 cc retained urine on bladder scan.  Keep Foley in situ  10) acute respiratory failure with hypoxia.  Patient requiring 3 to 4 L of oxygen.  When oxygen is removed, she does drop down into the 80s.  She is not coughing or febrile.  She does not appear to be volume overloaded.  Suspect that she may have hypoventilation and atelectasis.    11)FEN--recurrent hypoglycemia-- -Recurrent episodes of hypoglycemia due to very very poor oral intake -Requiring IV dextrose solutions repeatedly -Patient alternates from being very lethargic after sedation with medications to being very agitated and requiring sedatives --Patient sister requesting transition to comfort care, hospice consult requested  Disposition/Need for in-Hospital Stay- patient unable to be discharged at this time due to --- -transfer to hospice house when bed available  Code Status : DNR  Family Communication:  Discussed with [name]-- [phone] sister who is her POA ---Patient sister requesting transition to comfort care, hospice consult requested  Consults  : Hospice  With History of - Reviewed by me  Past Medical History:  Diagnosis Date  . Arthritis   . CHF (congestive heart failure) ([place])   . Colon polyps   . Coronary atherosclerosis of native coronary artery    Nonobstructive at catherization 2006  . Dementia ([place])   . Esophageal ring    Distal esophageal ring status post dilation  . Gastrointestinal bleed    Related to NSAIDs  . GERD (gastroesophageal reflux disease)   . Hiatal hernia    Moderate sized sliding hiatal hernia  . Hypertension   . Hypothyroidism   . Intracranial hemorrhage ([place])    Left subcortical 2015  . Mixed hyperlipidemia    Statin intolerant  . Neuropathy       Past Surgical History:  Procedure Laterality Date  . APPENDECTOMY    . CATARACT EXTRACTION    . COLON SURGERY     . TOTAL ABDOMINAL HYSTERECTOMY    . TOTAL ABDOMINAL HYSTERECTOMY W/ BILATERAL SALPINGOOPHORECTOMY     No chief complaint on file.   HPI:    Lisa Kemp  is a 86 y.o. adult with medical history significant fordCHF, CAD, H/o prior SDH , HTN, dementia and  chronic respiratory failure on 3 L at baseline admitted on 10/15/2019 from [facility] after a fall, found to have acute CVA-remains confused  - Talk to nursing home staff, at baseline, patient can answer simple questions and hold simple conversations, she recognizes staff, but ambulates with a wheelchair -Patient had increasing confusion for almost a week PTA  -Recurrent episodes of hypoglycemia due to very very poor oral intake -Requiring IV dextrose solutions repeatedly -Patient alternates from being very lethargic after sedation with medications to being very agitated and requiring sedatives --Patient sister requesting transition to comfort care, hospice consult requested   Review of systems:    In addition to the HPI above,  A full Review of  Systems was done, all other systems reviewed are negative except as noted above in HPI , .   Social History:  Reviewed by me    Social History   Tobacco Use  . Smoking status: Never Smoker  . Smokeless tobacco: Never Used  Substance Use Topics  . Alcohol use: No    Alcohol/week: 0.0 standard drinks    Comment: 06/11/14 -- "Never tasted a beer, wine, or a whiskey.." Delos Haring     Family History :  Reviewed by me    Family History  Problem Relation Age of Onset  . Heart attack Mother   . Heart attack Father   . Hypertension Other      Home Medications:   Prior to Admission medications   Medication Sig Start Date End Date Taking? Authorizing Provider  acetaminophen (TYLENOL) 500 MG tablet Take 500 mg by mouth every 8 (eight) hours as needed for moderate pain.   Yes [provider]  aspirin 81 MG tablet Take 1 tablet (81 mg total) by mouth daily with  breakfast. Take aspirin 81 mg daily along with Plavix 75 mg daily for 3 weeks, then after 3 weeks take only Plavix 75 mg daily without any further aspirin therapy Patient taking differently: Take 81 mg by mouth daily with breakfast.  10/19/19  Yes Latara Micheli, MD  atenolol (TENORMIN) 25 MG tablet Take 0.5 tablets (12.5 mg total) by mouth at bedtime. Patient taking differently: Take 25 mg by mouth 2 (two) times daily.  10/19/19  Yes Hollye Pritt, MD  Calcium 600-200 MG-UNIT tablet Take 1 tablet by mouth 2 (two) times daily.   Yes [provider]  dextromethorphan-guaiFENesin (TUSSIN DM) 10-100 MG/5ML liquid Take 5 mLs by mouth every 6 (six) hours as needed for cough.   Yes [provider]  docusate sodium (COLACE) 100 MG capsule Take 1 capsule (100 mg total) by mouth daily. 03/07/19  Yes Shah, Pratik D, DO  ferrous sulfate 325 (65 FE) MG tablet Take 325 mg by mouth daily.   Yes [provider]  furosemide (LASIX) 40 MG tablet Take 40 mg by mouth daily.   Yes [provider]  gabapentin (NEURONTIN) 100 MG capsule Take 1 capsule (100 mg total) by mouth at bedtime. 01/01/14  Yes Angiulli, Lavon Paganini, PA-C  levothyroxine (SYNTHROID) 175 MCG tablet Take 175 mcg by mouth daily. 10/02/19  Yes [provider]  lisinopril (ZESTRIL) 5 MG tablet Take 5 mg by mouth daily.   Yes [provider]  loperamide (IMODIUM A-D) 2 MG tablet Take 2 mg by mouth 4 (four) times daily as needed for diarrhea or loose stools.   Yes [provider]  LORazepam (ATIVAN) 1 MG tablet Take 0.5-1 tablets (0.5-1 mg total) by mouth See admin instructions. Take 0.5mg  by mouth daily in the evening. Take 1mg   At bedtime Patient taking differently: Take 0.5-1 mg by mouth See admin instructions. Take 0.5mg  by mouth daily in the evening and take 1mg   At bedtime 10/19/19  Yes Crescencio Jozwiak, MD  nitroGLYCERIN (NITROSTAT) 0.4 MG SL tablet Place 1 tablet (0.4 mg total) under the tongue  as directed. Patient taking differently: Place 0.4 mg under the tongue every 5 (five) minutes as needed.  06/19/16  Yes Satira Sark, MD  omeprazole (PRILOSEC) 40 MG capsule Take 40 mg by mouth daily.    Yes [provider]  ondansetron (ZOFRAN) 4 MG tablet TAKE 1 TABLET BY MOUTH TWICE DAILY  AS NEEDED FOR NAUSEA. Patient taking differently: Take 4 mg by mouth 2 (two) times daily as needed for nausea or vomiting.  12/27/16  Yes Rehman, Mechele Dawley, MD  potassium chloride (KLOR-CON) 10 MEQ tablet Take 10 mEq by mouth daily.   Yes [provider]  simethicone (MYLICON) 80 MG chewable tablet Chew 80 mg by mouth 2 (two) times daily.   Yes [provider]  trolamine salicylate (ASPERCREME) 10 % cream Apply 1 application topically 2 (two) times daily as needed for muscle pain.   Yes [provider]  vitamin B-12 (CYANOCOBALAMIN) 500 MCG tablet Take 500 mcg by mouth daily.   Yes [provider]  atorvastatin (LIPITOR) 20 MG tablet Take 1 tablet (20 mg total) by mouth every evening. 10/19/19   Roxan Hockey, MD     Allergies:     Allergies  Allergen Reactions  . Diazepam Other (See Comments)    Could not walk  . Statins Other (See Comments)    CRAMPING     Physical Exam:   Vitals  Pulse 92, SpO2 93 %.  RR 14 afebrile  General exam: Somnolent, with episodes of agitation from time to time requiring medication  respiratory system: Clear to auscultation. Respiratory effort normal. Cardiovascular system:RRR. No murmurs, rubs, gallops. Gastrointestinal system: Abdomen is nondistended, soft and nontender. . Normal bowel sounds heard. Central nervous system: No focal neurological deficits. Extremities: No C/C/E, +pedal pulses Skin: No rashes, lesions or ulcers Psychiatry: Somnolent.    Data Review:    CBC Recent Labs  Lab 10/15/19 1656 10/16/19 0517 10/18/19 0856  WBC 6.4 7.0 5.2  HGB 9.4* 9.2* 8.2*  HCT 30.5* 29.7* 26.5*  PLT 184 198  152  MCV 98.7 96.7 97.1  MCH 30.4 30.0 30.0  MCHC 30.8 31.0 30.9  RDW 13.1 13.0 13.3  LYMPHSABS 1.2  --   --   MONOABS 0.6  --   --   EOSABS 0.2  --   --   BASOSABS 0.1  --   --     Chemistries  Recent Labs  Lab 10/15/19 1656 10/16/19 0517 10/18/19 0856 10/19/19 0511  NA 136 136 137 141  K 5.8* 5.1 4.1 4.4  CL 89* 89* 92* 96*  CO2 38* 36* 34* 31  GLUCOSE 100* 95 74 57*  BUN 44* 42* 31* 42*  CREATININE 1.04* 0.93 1.09* 1.27*  CALCIUM 10.0 10.1 9.3 9.3   ------------------------------------------------------------------------------------------------------------------ estimated creatinine clearance is 27.9 mL/min (A) for female patients and 34.2 mL/min (A) for female patients (by C-G formula based on SCr of 1.27 mg/dL (H)). ------------------------------------------------------------------------------------------------------------------ No results for input(s): TSH, T4TOTAL, T3FREE, THYROIDAB in the last 72 hours.  Invalid input(s): FREET3   Coagulation profile No results for input(s): INR, PROTIME in the last 168 hours. ------------------------------------------------------------------------------------------------------------------- No results for input(s): DDIMER in the last 72 hours. -------------------------------------------------------------------------------------------------------------------  Cardiac Enzymes No results for input(s): CKMB, TROPONINI, MYOGLOBIN in the last 168 hours.  Invalid input(s): CK ------------------------------------------------------------------------------------------------------------------    Component Value Date/Time   BNP 260.0 (H) 10/15/2019 1656    Urinalysis    Component Value Date/Time   COLORURINE YELLOW 10/17/2019 [place] 10/17/2019 0753   LABSPEC 1.012 10/17/2019 0753   PHURINE 7.0 10/17/2019 [place] 10/17/2019 0753   HGBUR NEGATIVE 10/17/2019 [place] NEGATIVE 10/17/2019  [place] 10/17/2019 0753   PROTEINUR NEGATIVE 10/17/2019 0753   NITRITE NEGATIVE 10/17/2019 [place] 10/17/2019 0753  Imaging Results:    DG CHEST PORT 1 VIEW  Result Date: 10/18/2019 CLINICAL DATA:  Respiratory failure, history of CHF EXAM: PORTABLE CHEST 1 VIEW COMPARISON:  10/15/2019 FINDINGS: Single frontal view of the chest demonstrates a stable cardiac silhouette. No airspace disease, effusion, or pneumothorax. The cardiac silhouette is stable. Hiatal hernia unchanged. No acute bony abnormalities. IMPRESSION: 1. Stable hiatal hernia.  No acute airspace disease. Electronically Signed   By: Randa Ngo M.D.   On: 10/18/2019 19:40    Radiological Exams on Admission: DG CHEST PORT 1 VIEW  Result Date: 10/18/2019 CLINICAL DATA:  Respiratory failure, history of CHF EXAM: PORTABLE CHEST 1 VIEW COMPARISON:  10/15/2019 FINDINGS: Single frontal view of the chest demonstrates a stable cardiac silhouette. No airspace disease, effusion, or pneumothorax. The cardiac silhouette is stable. Hiatal hernia unchanged. No acute bony abnormalities. IMPRESSION: 1. Stable hiatal hernia.  No acute airspace disease. Electronically Signed   By: Randa Ngo M.D.   On: 10/18/2019 19:40   DVT Prophylaxis -comfort care only AM Labs Ordered, also please review Full Orders  Family Communication: Admission, patients condition and plan of care including tests being ordered have been discussed with the patient and -patient's sister who indicate understanding and agree with the plan   Code Status - DNR Likely DC to hospice house when bed available --At this time --Patient sister requesting transition to comfort care, and transfer to hospice house when bed available  Condition  -- overall prognosis is grave  Roxan Hockey M.D on 10/20/2019 at 4:24 PM Go to [email or web address] -  for contact info  [facility] - Office  [phone]

## 2019-10-20 NOTE — Plan of Care (Signed)
  Problem: Safety: Goal: Ability to remain free from injury will improve Outcome: Progressing   Problem: Skin Integrity: Goal: Risk for impaired skin integrity will decrease Outcome: Progressing   

## 2019-10-20 NOTE — Progress Notes (Signed)
Patient was combative and uncooperative during the shift.  She refused to eat and take her medications.

## 2019-10-20 NOTE — Progress Notes (Signed)
PT Cancellation Note  Patient Details Name: Lisa Kemp MRN: [id number] DOB: [birth year]   Cancelled Treatment:    Reason Eval/Treat Not Completed: Other (comment)(Patient transferred to hospice care).  Patient to be transferred to comfort measures only and discharged from physical therapy to care of nursing.   7:37 AM, 10/20/19 Lonell Grandchild, MPT Physical Therapist with [facility] [phone] office [phone] mobile phone

## 2019-10-21 DIAGNOSIS — I959 Hypotension, unspecified: Secondary | ICD-10-CM | POA: Diagnosis not present

## 2019-10-21 DIAGNOSIS — Z515 Encounter for palliative care: Secondary | ICD-10-CM | POA: Diagnosis not present

## 2019-10-21 DIAGNOSIS — Z7401 Bed confinement status: Secondary | ICD-10-CM | POA: Diagnosis not present

## 2019-10-21 DIAGNOSIS — R404 Transient alteration of awareness: Secondary | ICD-10-CM | POA: Diagnosis not present

## 2019-10-21 DIAGNOSIS — R410 Disorientation, unspecified: Secondary | ICD-10-CM | POA: Diagnosis not present

## 2019-10-21 MED ORDER — ENSURE ENLIVE PO LIQD: 237.0000 mL | Freq: Three times a day (TID) | ORAL | 2 refills | Status: AC

## 2019-10-21 MED ORDER — MORPHINE SULFATE (CONCENTRATE) 10 MG/0.5ML PO SOLN: 5.0000 mg | ORAL | 0 refills | Status: AC | PRN

## 2019-10-21 MED ORDER — GLYCOPYRROLATE 1 MG PO TABS: 1.0000 mg | ORAL_TABLET | ORAL | 0 refills | Status: AC | PRN

## 2019-10-21 MED ORDER — ACETAMINOPHEN 325 MG PO TABS: 650.0000 mg | ORAL_TABLET | Freq: Four times a day (QID) | ORAL | 0 refills | Status: AC | PRN

## 2019-10-21 MED ORDER — OLANZAPINE 5 MG PO TBDP: 5.0000 mg | ORAL_TABLET | Freq: Every day | ORAL | 0 refills | Status: AC

## 2019-10-21 MED ORDER — ONDANSETRON 4 MG PO TBDP: 4.0000 mg | ORAL_TABLET | Freq: Four times a day (QID) | ORAL | 0 refills | Status: AC | PRN

## 2019-10-21 NOTE — Discharge Summary (Signed)
Lisa Kemp, is a 86 y.o. adult  DOB [birth year]  MRN [id number].  Admission date:  10/20/2019  Admitting Physician  Roxan Hockey, MD  Discharge Date:  10/21/2019   Primary MD  Glenda Chroman, MD  Recommendations for primary care physician for things to follow:  1)Transfer to [facility] with Full Comfort care  Admission Diagnosis  BRAIN LESION   Discharge Diagnosis  BRAIN LESION  *  Principal Problem:   End of life care Active Problems:   Acute CVA (cerebrovascular accident)/Lt temporal lobe AND Insula   Acute metabolic encephalopathy   Palliative care encounter   Dementia [facility])   Unspecified atrial fibrillation ([place])   Essential hypertension, benign   H/o SDH (subdural hematoma) -2015   Hypothyroidism      Past Medical History:  Diagnosis Date  . Arthritis   . CHF (congestive heart failure) ([place])   . Colon polyps   . Coronary atherosclerosis of native coronary artery    Nonobstructive at catherization 2006  . Dementia ([place])   . Esophageal ring    Distal esophageal ring status post dilation  . Gastrointestinal bleed    Related to NSAIDs  . GERD (gastroesophageal reflux disease)   . Hiatal hernia    Moderate sized sliding hiatal hernia  . Hypertension   . Hypothyroidism   . Intracranial hemorrhage ([place])    Left subcortical 2015  . Mixed hyperlipidemia    Statin intolerant  . Neuropathy     Past Surgical History:  Procedure Laterality Date  . APPENDECTOMY    . CATARACT EXTRACTION    . COLON SURGERY    . TOTAL ABDOMINAL HYSTERECTOMY    . TOTAL ABDOMINAL HYSTERECTOMY W/ BILATERAL SALPINGOOPHORECTOMY         HPI  from the history and physical done on the day of admission:   -Lisa Kemp  is a 86 y.o. adult with medical history significant fordCHF, CAD, H/o prior SDH , HTN, dementia and chronic respiratory failure on 3 L at baseline admitted on 10/15/2019  from [facility] after a fall, found to have acute CVA-remains confused  - Talk to nursing home staff, at baseline, patient can answer simple questions and hold simple conversations, she recognizes staff, but ambulates with a wheelchair -Patient had increasing confusion for almost a week PTA  -Recurrent episodes of hypoglycemia due to very very poor oral intake -Requiring IV dextrose solutions repeatedly -Patient alternates from being very lethargic after sedation with medications to being very agitated and requiring sedatives --Patient sister requesting transition to comfort care, hospice consult requested    Hospital Course:     Brief Summary:- 86 y.o.femalewith medical history significant fordCHF, CAD, H/o prior SDH , HTN, dementia and chronic respiratory failure on 3 L at baseline admitted on 10/15/2019 from [facility] after a fall, found to have acute CVA-remains confused -Recurrent episodes of hypoglycemia due to very very poor oral intake -Patient alternates from being very lethargic after sedation with medications to being very agitated and requiring  sedatives --Patient sister requested transition to comfort care, hospice consult requested  A/p 1) acute CVA of left temporal lobe and insular --cranial imaging studies including CT head, MRI brain, MRA head noted -A1c 5.6,  -Echo with EF of 55 to 60% with grade 1 diastolic dysfunction -No regional wall motion abnormalities -PTA patient was on aspirin 81 mg daily, treated with Plavix, -History of statin intolerance, LDL 105, treated with Lipitor 20 mg daily -PT and speech therapy eval appreciated recommends SNF rehab -At this time --Patient sister requested transition to comfort care, and transfer to hospice house when bed available  2) acute metabolic encephalopathy superimposed on underlying dementia--- most likely due to acute stroke as above #1, apparently recently treated for UTI with Augmentin, --Current UA does  not show any signs of infection -At baseline apparently patient has difficulties with mobility related ADLs, gets around with wheelchair at ALF -She has been receiving Ativan as needed, but is either somnolent or agitated -We will start on Seroquel nightly and use Haldol as needed during the day. -If mental status does not improve with improved p.o. intake, may need to consider palliative care consult. -Acute stroke with severe metabolic encephalopathy superimposed on underlying dementia with recurrent episodes of hypoglycemia due to poor oral intake associated with lethargy and alternating severe agitation---overall prognosis is poor --transfer to hospice housewith Full comfort care orders  3)HTN-- hold lisinopril and Lasix   4) hypothyroidism--- treated with levothyroxine  TSH 0.2  5) status post fall--- may be secondary to #1 above, PT eval appreciated, no acute fractures on imaging studies, recommends SNF rehab--transfer to hospice housewith Full comfort care orders  6)Hyperkalemia--- most likely due to concomitant lisinopril use as well as potassium replacements PTA -Lisinopril and potassium has been discontinued,  7)New onset atrial fibrillation --- most likely paroxysmal A. Fib , potassium has normalized, echo with EF of 55 to 60% HASBLED Score = 4 CHA2DS2- VASc score is = 7 Which is equal to = 11.2% annual risk of stroke  --Discussed risk Versus benefit of FULL anticoagulation with patient's sister [name]-- [phone] sister declined for anticoagulation given increased's HASBLED score and history of falls Treated with aspirin and Plavix, transition to comfort care at this time  8) chronic anemia--- suspect partly nutritional, hemoglobin currently 8.2, her baseline is around 10, no evidence of ongoing bleeding -transfer to hospice housewith Full comfort care orders   9) urinary retention. Noted by staff to have  retained urine on  bladder scan.  Keep Foley in situ  10) acute respiratory failure with hypoxia. Patient requiring 3 to 4 L of oxygen. When oxygen is removed, she does drop down into the 80s. She is not coughing or febrile. She does not appear to be volume overloaded. Suspect that she may have hypoventilation and atelectasis. -transfer to hospice housewith Full comfort care orders   11)FEN--recurrent hypoglycemia-- -Recurrent episodes of hypoglycemia due to very very poor oral intake -Requiring IV dextrose solutions repeatedly -Patient alternates from being very lethargic after sedation with medications to being very agitated and requiring sedatives transfer to hospice housewith Full comfort care orders  Disposition---transfer to hospice housewith Full comfort care orders  Code Status:DNR  Family Communication: Discussed withMs. Ellamae Sia-- [phone] sister who is her POA -transfer to hospice housewith Full comfort care orders  Consults :Hospice   Discharge Condition: Overall prognosis is grave  Follow UP  Contact information for after-discharge care    [facility] .  Service: Inpatient Hospice Contact information: [address] [address] [phone]             Diet and Activity recommendation:  As advised  Discharge Instructions    Discharge Instructions    Bed rest   Complete by: As directed    Call MD for:  difficulty breathing, headache or visual disturbances   Complete by: As directed    Call MD for:  extreme fatigue   Complete by: As directed    Call MD for:  persistant dizziness or light-headedness   Complete by: As directed    Call MD for:  persistant nausea and vomiting   Complete by: As directed    Call MD for:  severe uncontrolled pain   Complete by: As directed    Call MD for:  temperature >100.4   Complete by: As directed    Diet - low sodium heart healthy    Complete by: As directed    Discharge instructions   Complete by: As directed    1)Transfer to [facility] with Full Comfort care       Discharge Medications     Allergies as of 10/21/2019      Reactions   Diazepam Other (See Comments)   Could not walk   Statins Other (See Comments)   CRAMPING      Medication List    STOP taking these medications   aspirin 81 MG tablet   atenolol 25 MG tablet Commonly known as: TENORMIN   atorvastatin 20 MG tablet Commonly known as: LIPITOR   Calcium 600-200 MG-UNIT tablet   docusate sodium 100 MG capsule Commonly known as: COLACE   ferrous sulfate 325 (65 FE) MG tablet   furosemide 40 MG tablet Commonly known as: LASIX   gabapentin 100 MG capsule Commonly known as: NEURONTIN   levothyroxine 175 MCG tablet Commonly known as: SYNTHROID   lisinopril 5 MG tablet Commonly known as: ZESTRIL   loperamide 2 MG tablet Commonly known as: IMODIUM A-D   LORazepam 1 MG tablet Commonly known as: ATIVAN   nitroGLYCERIN 0.4 MG SL tablet Commonly known as: NITROSTAT   omeprazole 40 MG capsule Commonly known as: PRILOSEC   ondansetron 4 MG tablet Commonly known as: ZOFRAN   potassium chloride 10 MEQ tablet Commonly known as: KLOR-CON   simethicone 80 MG chewable tablet Commonly known as: MYLICON   trolamine salicylate 10 % cream Commonly known as: ASPERCREME   Tussin DM 10-100 MG/5ML liquid Generic drug: dextromethorphan-guaiFENesin   vitamin B-12 500 MCG tablet Commonly known as: CYANOCOBALAMIN     TAKE these medications   acetaminophen 325 MG tablet Commonly known as: TYLENOL Take 2 tablets (650 mg total) by mouth every 6 (six) hours as needed for mild pain (or Fever >/= 101). What changed:   medication strength  how much to take  when to take this  reasons to take this   feeding supplement (ENSURE ENLIVE) Liqd Take 237 mLs by mouth in the morning, at noon, and at bedtime.   glycopyrrolate 1 MG  tablet Commonly known as: ROBINUL Take 1 tablet (1 mg total) by mouth every 4 (four) hours as needed (excessive secretions).   morphine CONCENTRATE 10 MG/0.5ML Soln concentrated solution Take 0.25 mLs (5 mg total) by mouth every 2 (two) hours as needed for moderate pain (or dyspnea).   OLANZapine zydis 5 MG disintegrating tablet Commonly known as: ZYPREXA Take 1 tablet (5 mg total) by mouth at bedtime.  ondansetron 4 MG disintegrating tablet Commonly known as: ZOFRAN-ODT Take 1 tablet (4 mg total) by mouth every 6 (six) hours as needed for nausea.       Major procedures and Radiology Reports - PLEASE review detailed and final reports for all details, in brief -   DG Chest 1 View  Result Date: 10/15/2019 CLINICAL DATA:  86 year old female with fall.  Shortness of breath. EXAM: CHEST  1 VIEW COMPARISON:  Chest radiograph dated 03/04/2019. FINDINGS: A crescentic airspace opacity at the medial right lung base, likely atelectasis secondary to a moderate-sized hiatal hernia. The lungs are otherwise clear. There is no pleural effusion or pneumothorax. Mild cardiomegaly. Atherosclerotic calcification of the aorta. No acute osseous pathology. Osteopenia. IMPRESSION: Probable area of atelectasis in the right infrahilar region secondary to a moderate size hiatal hernia. No definite acute cardiopulmonary process. Electronically Signed   By: Anner Crete M.D.   On: 10/15/2019 18:01   DG Pelvis 1-2 Views  Result Date: 10/15/2019 CLINICAL DATA:  86 year old female with fall. EXAM: PELVIS - 1-2 VIEW COMPARISON:  None. FINDINGS: There is no acute fracture or dislocation. The bones are osteopenic. There is degenerative changes of the visualized lower lumbar spine. Multiple surgical clips noted over the pelvis. The soft tissues are unremarkable. Small radiopaque foci over the lower abdomen bilaterally may represent fecalith versus renal calculi. Vascular calcifications noted. IMPRESSION: No acute  fracture or dislocation. Electronically Signed   By: Anner Crete M.D.   On: 10/15/2019 18:03   DG Tibia/Fibula Right  Result Date: 10/15/2019 CLINICAL DATA:  86 year old female status post fall with right lower leg skin tear. EXAM: RIGHT TIBIA AND FIBULA - 2 VIEW COMPARISON:  None. FINDINGS: Calcified peripheral vascular disease. Generalized soft tissue stranding. No soft tissue gas identified. No radiopaque foreign body identified. No acute osseous abnormality identified. Severe lateral compartment joint degeneration at the right knee. IMPRESSION: 1. No acute osseous abnormality identified about the right tib-fib. Severe lateral compartment right knee joint degeneration. 2. Calcified peripheral vascular disease. Generalized soft tissue stranding. Electronically Signed   By: Genevie Ann M.D.   On: 10/15/2019 18:03   CT Head Wo Contrast  Result Date: 10/15/2019 CLINICAL DATA:  Head trauma, headache. Additional history provided: Unwitnessed fall, increased confusion this week, recent diagnosis of UTI, history of hypertension, dementia, intracranial hemorrhage EXAM: CT HEAD WITHOUT CONTRAST TECHNIQUE: Contiguous axial images were obtained from the base of the skull through the vertex without intravenous contrast. COMPARISON:  Head CT 03/22/2015. FINDINGS: Brain: There is no evidence of acute intracranial hemorrhage. No midline shift or extra-axial fluid collection. There is a region of abnormal hypodensity within the left temporal lobe measuring 6.2 x 2.0 x 1.8 cm (AP x TV x CC) (series 2, image 15) (series 4, image 48). This abnormal hypodensity appears to predominantly affect the white matter. Mild for age ill-defined hypoattenuation within the remainder of the cerebral white matter is nonspecific, but consistent with chronic small vessel ischemic disease. Mild generalized parenchymal atrophy. Redemonstrated nonspecific irregular dural-based calcifications along right frontal convexity. Vascular: No  hyperdense vessel. Atherosclerotic calcifications. Skull: No calvarial fracture or aggressive osseous lesion. Sinuses/Orbits: Visualized orbits demonstrate no acute abnormality. No significant paranasal sinus disease or mastoid effusion. Other: Right parietal scalp soft tissue swelling These results were called by telephone at the time of interpretation on 10/15/2019 at 5:51 pm to provider [facility] , who verbally acknowledged these results. IMPRESSION: 6.2 cm region of abnormal hypodensity within the left temporal lobe predominantly affecting  the white matter. This finding is new from CT 03/22/2015 and indeterminate. Primary differential considerations include edema related to a primary or metastatic neoplasm versus an infectious/inflammatory process. Recent ischemic infarction is also a consideration, although this would be an atypical appearance. Contrast-enhanced brain MRI is recommended for further evaluation. Background mild generalized parenchymal atrophy and chronic small vessel ischemic disease. No evidence of acute intracranial hemorrhage. Right parietal scalp soft tissue swelling. Electronically Signed   By: Kellie Simmering DO   On: 10/15/2019 17:51   MR ANGIO HEAD WO CONTRAST  Result Date: 10/15/2019 CLINICAL DATA:  [place].  Left temporal abnormality. EXAM: MRA HEAD WITHOUT CONTRAST TECHNIQUE: Angiographic images of the Circle of Willis were obtained using MRA technique without intravenous contrast. COMPARISON:  Brain study same day FINDINGS: The study is limited by motion degradation. Both internal carotid arteries are patent through the skull base and siphon regions. There is flow in the anterior and middle cerebral arteries. Distal vessels cannot be evaluated. Both vertebral arteries are patent to the basilar. The basilar artery shows flow. Both superior cerebellar arteries and posterior cerebral arteries show flow. IMPRESSION: Limited and motion degraded exam. Flow within the major vessels at the  base of the brain. Distal vessel information not reliable because of pronounced motion. Electronically Signed   By: Nelson Chimes M.D.   On: 10/15/2019 19:20   MR BRAIN WO CONTRAST  Result Date: 10/15/2019 CLINICAL DATA:  Encephalopathy. Unwitnessed fall with trauma to the head. EXAM: MRI HEAD WITHOUT CONTRAST TECHNIQUE: Multiplanar, multiecho pulse sequences of the brain and surrounding structures were obtained without intravenous contrast. COMPARISON:  Head CT same day.  Head CT 03/22/2015. FINDINGS: MRI HEAD FINDINGS The study is limited and abbreviated by patient motion. Brain: There is acute/subacute infarction affecting the left temporal lobe, with minor involvement of the insular brain. The study is markedly degraded by motion, but I do not have significant suspicion regarding tumor or cerebritis. No gross hemorrhage. No significant mass effect or shift. Elsewhere, there chronic small-vessel ischemic changes of the white matter. Vascular: No vascular information available. Skull and upper cervical spine: No abnormality seen. Sinuses/Orbits: Negative Other: None IMPRESSION: Acute to early subacute infarction affecting the left temporal lobe, with minor involvement of the adjacent insular brain. The study is limited and abbreviated by patient motion. I do not have any significant suspicion regarding tumor or cerebritis. Electronically Signed   By: Nelson Chimes M.D.   On: 10/15/2019 19:17   US Carotid Bilateral  Result Date: 10/17/2019 Griffin Basil, RT     10/17/2019 12:31 PM Patient refused Carotid Ultrasound . vmm  DG CHEST PORT 1 VIEW  Result Date: 10/18/2019 CLINICAL DATA:  Respiratory failure, history of CHF EXAM: PORTABLE CHEST 1 VIEW COMPARISON:  10/15/2019 FINDINGS: Single frontal view of the chest demonstrates a stable cardiac silhouette. No airspace disease, effusion, or pneumothorax. The cardiac silhouette is stable. Hiatal hernia unchanged. No acute bony abnormalities. IMPRESSION: 1.  Stable hiatal hernia.  No acute airspace disease. Electronically Signed   By: Randa Ngo M.D.   On: 10/18/2019 19:40   ECHOCARDIOGRAM COMPLETE  Result Date: 10/16/2019    ECHOCARDIOGRAM REPORT   Patient Name:   ALEEZAY MELCHERT Date of Exam: 10/16/2019 Medical Rec #:  [id number]           Height:       65.0 in Accession #:    [id number]          Weight:  163.1 lb Date of Birth:  September 16, 1923           BSA:          1.81 m Patient Age:    65 years            BP:           119/55 mmHg Patient Gender: F                   HR:           82 bpm. Exam Location:  [facility] Procedure: 2D Echo Indications:    Abnormal ECG 794.31 / R94.31  History:        Patient has prior history of Echocardiogram examinations, most                 recent 03/05/2019. CHF, CAD, Stroke, Arrythmias:Atrial                 Fibrillation; Risk Factors:Hypertension, Non-Smoker and                 Dyslipidemia. GERD, Elevated troponin.  Sonographer:    Leavy Cella RDCS (AE) Referring Phys: [place]  1. Left ventricular ejection fraction, by estimation, is 55 to 60%. The left ventricle has normal function. The left ventricle has no regional wall motion abnormalities. There is mild left ventricular hypertrophy. Left ventricular diastolic parameters are consistent with Grade I diastolic dysfunction (impaired relaxation).  2. Right ventricular systolic function is normal. The right ventricular size is normal. There is moderately elevated pulmonary artery systolic pressure. The estimated right ventricular systolic pressure is Q000111Q mmHg.  3. Left atrial size was mildly dilated.  4. The mitral valve is grossly normal. Mild mitral valve regurgitation.  5. Tricuspid valve regurgitation is mild to moderate.  6. The aortic valve is tricuspid. Aortic valve regurgitation is trivial.  7. The inferior vena cava is normal in size with greater than 50% respiratory variability, suggesting right atrial pressure of 3 mmHg.  FINDINGS  Left Ventricle: Left ventricular ejection fraction, by estimation, is 55 to 60%. The left ventricle has normal function. The left ventricle has no regional wall motion abnormalities. The left ventricular internal cavity size was normal in size. There is  mild left ventricular hypertrophy. Left ventricular diastolic parameters are consistent with Grade I diastolic dysfunction (impaired relaxation). Right Ventricle: The right ventricular size is normal. No increase in right ventricular wall thickness. Right ventricular systolic function is normal. There is moderately elevated pulmonary artery systolic pressure. The tricuspid regurgitant velocity is 3.41 m/s, and with an assumed right atrial pressure of 3 mmHg, the estimated right ventricular systolic pressure is Q000111Q mmHg. Left Atrium: Left atrial size was mildly dilated. Right Atrium: Right atrial size was normal in size. Pericardium: There is no evidence of pericardial effusion. Mitral Valve: The mitral valve is grossly normal. Mild mitral valve regurgitation. Tricuspid Valve: The tricuspid valve is grossly normal. Tricuspid valve regurgitation is mild to moderate. Aortic Valve: The aortic valve is tricuspid. Aortic valve regurgitation is trivial. Aortic regurgitation PHT measures 297 msec. Moderate aortic valve annular calcification. There is mild calcification of the aortic valve. Pulmonic Valve: The pulmonic valve was grossly normal. Pulmonic valve regurgitation is not visualized. Aorta: The aortic root is normal in size and structure. Venous: The inferior vena cava is normal in size with greater than 50% respiratory variability, suggesting right atrial pressure of 3 mmHg. IAS/Shunts: No atrial level shunt detected by color  flow Doppler.  LEFT VENTRICLE PLAX 2D LVIDd:         4.11 cm  Diastology LVIDs:         3.13 cm  LV e' lateral:   5.87 cm/s LV PW:         1.10 cm  LV E/e' lateral: 19.4 LV IVS:        1.33 cm  LV e' medial:    10.10 cm/s LVOT diam:      2.00 cm  LV E/e' medial:  11.3 LV SV Index:   19.27 LVOT Area:     3.14 cm  RIGHT VENTRICLE RV S prime:     8.81 cm/s TAPSE (M-mode): 2.6 cm LEFT ATRIUM             Index       RIGHT ATRIUM           Index LA diam:        3.70 cm 2.04 cm/m  RA Area:     14.20 cm LA Vol (A2C):   82.8 ml 45.64 ml/m RA Volume:   34.10 ml  18.80 ml/m LA Vol (A4C):   54.0 ml 29.77 ml/m LA Biplane Vol: 68.9 ml 37.98 ml/m  AORTIC VALVE AI PHT:      297 msec  AORTA Ao Root diam: 3.00 cm MITRAL VALVE                TRICUSPID VALVE MV Area (PHT): 5.13 cm     TR Peak grad:   46.5 mmHg MV Decel Time: 148 msec     TR Vmax:        341.00 cm/s MR Peak grad: 130.0 mmHg MR Mean grad: 95.0 mmHg     SHUNTS MR Vmax:      570.00 cm/s   Systemic Diam: 2.00 cm MR Vmean:     463.0 cm/s MV E velocity: 114.00 cm/s MV A velocity: 117.00 cm/s MV E/A ratio:  0.97 Rozann Lesches MD Electronically signed by Rozann Lesches MD Signature Date/Time: 10/16/2019/11:40:24 AM    Final     Micro Results   Recent Results (from the past 240 hour(s))  SARS CORONAVIRUS 2 (TAT 6-24 HRS) Nasopharyngeal Nasopharyngeal Swab     Status: None   Collection Time: 10/15/19  7:40 PM   Specimen: Nasopharyngeal Swab  Result Value Ref Range Status   SARS Coronavirus 2 NEGATIVE NEGATIVE Final    Comment: (NOTE) SARS-CoV-2 target nucleic acids are NOT DETECTED. The SARS-CoV-2 RNA is generally detectable in upper and lower respiratory specimens during the acute phase of infection. Negative results do not preclude SARS-CoV-2 infection, do not rule out co-infections with other pathogens, and should not be used as the sole basis for treatment or other patient management decisions. Negative results must be combined with clinical observations, patient history, and epidemiological information. The expected result is Negative. Fact Sheet for Patients: SugarRoll.be Fact Sheet for Healthcare  Providers: https://www.woods-mathews.com/ This test is not yet approved or cleared by the [place] FDA and  has been authorized for detection and/or diagnosis of SARS-CoV-2 by FDA under an Emergency Use Authorization (EUA). This EUA will remain  in effect (meaning this test can be used) for the duration of the COVID-19 declaration under Section 56 4(b)(1) of the Act, 21 U.S.C. section 360bbb-3(b)(1), unless the authorization is terminated or revoked sooner. Performed at [facility], [place] [address], [place], [place] [address]   Urine Culture     Status: Abnormal  Collection Time: 10/17/19  7:53 AM   Specimen: Urine, Clean Catch  Result Value Ref Range Status   Specimen Description   Final    URINE, CLEAN CATCH Performed at [facility], [address], [place], [place] [address]    Special Requests   Final    Normal Performed at [facility], [address], [place],  [address]    Culture 20,000 COLONIES/mL Su Hoff (A)  Final   Report Status 10/19/2019 FINAL  Final   Organism ID, Bacteria CITROBACTER YOUNGAE (A)  Final      Susceptibility   Citrobacter youngae - MIC*    CEFAZOLIN >=64 RESISTANT Resistant     CEFTRIAXONE >=64 RESISTANT Resistant     CIPROFLOXACIN <=0.25 SENSITIVE Sensitive     GENTAMICIN <=1 SENSITIVE Sensitive     IMIPENEM 0.5 SENSITIVE Sensitive     NITROFURANTOIN <=16 SENSITIVE Sensitive     TRIMETH/SULFA <=20 SENSITIVE Sensitive     PIP/TAZO >=128 RESISTANT Resistant     * 20,000 COLONIES/mL CITROBACTER YOUNGAE    Today   Subjective    Rubiana Angelico today has no new concerns -transfer to hospice housewith Full comfort care orders       Patient has been seen and examined prior to discharge   Objective   Blood pressure 120/86, pulse (!) 101, resp. rate (!) 22, SpO2 90 %.   Intake/Output Summary (Last 24 hours) at 10/21/2019 [id number] Last data filed at 10/21/2019 0500 Gross per 24 hour  Intake --  Output  500 ml  Net -500 ml    Exam General exam:Somnolent, with episodes of agitation from time to time requiring medication  respiratory system: Clear to auscultation. Respiratory effort normal. Cardiovascular system:RRR. No murmurs, rubs, gallops. Gastrointestinal system:Abdomen is nondistended, soft and nontender. . Normal bowel sounds heard. Central nervous system:No New focal neurological deficits. Extremities: No C/C/E, +pedal pulses Skin: No rashes, lesions or ulcers Psychiatry:Somnolent.   Data Review   CBC w Diff:  Lab Results  Component Value Date   WBC 5.2 10/18/2019   HGB 8.2 (L) 10/18/2019   HCT 26.5 (L) 10/18/2019   PLT 152 10/18/2019   LYMPHOPCT 19 10/15/2019   MONOPCT 9 10/15/2019   EOSPCT 3 10/15/2019   BASOPCT 1 10/15/2019    CMP:  Lab Results  Component Value Date   NA 141 10/19/2019   K 4.4 10/19/2019   CL 96 (L) 10/19/2019   CO2 31 10/19/2019   BUN 42 (H) 10/19/2019   CREATININE 1.27 (H) 10/19/2019   PROT 6.6 03/04/2019   ALBUMIN 3.7 03/04/2019   BILITOT 0.3 03/04/2019   ALKPHOS 44 03/04/2019   AST 21 03/04/2019   ALT 17 03/04/2019  . Total Discharge time is about 33 minutes  Roxan Hockey M.D on 10/21/2019 at 9:28 AM  Go to [email or web address] -  for contact info  [facility] - Office  [phone]

## 2019-10-21 NOTE — Discharge Instructions (Signed)
1)Transfer to [facility] with Full Comfort care

## 2019-10-21 NOTE — Plan of Care (Signed)
  Problem: Education: Goal: Knowledge of General Education information will improve Description: Including pain rating scale, medication(s)/side effects and non-pharmacologic comfort measures Outcome: Adequate for Discharge   Problem: Health Behavior/Discharge Planning: Goal: Ability to manage health-related needs will improve Outcome: Adequate for Discharge   Problem: Clinical Measurements: Goal: Ability to maintain clinical measurements within normal limits will improve Outcome: Adequate for Discharge Goal: Will remain free from infection Outcome: Adequate for Discharge Goal: Diagnostic test results will improve Outcome: Adequate for Discharge Goal: Respiratory complications will improve Outcome: Adequate for Discharge Goal: Cardiovascular complication will be avoided Outcome: Adequate for Discharge   Problem: Activity: Goal: Risk for activity intolerance will decrease Outcome: Adequate for Discharge   Problem: Nutrition: Goal: Adequate nutrition will be maintained Outcome: Adequate for Discharge   Problem: Coping: Goal: Level of anxiety will decrease Outcome: Adequate for Discharge   Problem: Elimination: Goal: Will not experience complications related to bowel motility Outcome: Adequate for Discharge Goal: Will not experience complications related to urinary retention Outcome: Adequate for Discharge   Problem: Pain Managment: Goal: General experience of comfort will improve 10/21/2019 0831 by Zachery Conch, RN Outcome: Adequate for Discharge 10/21/2019 0803 by Zachery Conch, RN Outcome: Progressing   Problem: Safety: Goal: Ability to remain free from injury will improve Outcome: Adequate for Discharge   Problem: Skin Integrity: Goal: Risk for impaired skin integrity will decrease 10/21/2019 0831 by Zachery Conch, RN Outcome: Adequate for Discharge 10/21/2019 0803 by Zachery Conch, RN Outcome: Progressing

## 2019-10-21 NOTE — TOC Transition Note (Signed)
Transition of Care [facility]) - CM/SW Discharge Note   Patient Details  Name: Lisa Kemp MRN: [id number] Date of Birth: 12-Jan-1924  [facility] CM/SW Contact:  [name], LCSW Phone Number: 10/21/2019, 9:38 AM   Clinical Narrative:     Received notification that there is a bed at the hospital facility for pt today. Pt's RN aware as is pt's son. TOC called EMS for transport. Pt needs DNR form sent in her packet.  There are no other needs for dc.  Final next level of care: [place] Barriers to Discharge: Barriers Resolved   Patient Goals and CMS Choice        Discharge Placement                       Discharge Plan and Services                                     Social Determinants of Health (SDOH) Interventions     Readmission Risk Interventions Readmission Risk Prevention Plan 03/05/2019  Transportation Screening Complete  PCP or Specialist Appt within 3-5 Days Complete  HRI or [place] Not Complete  Social Work Consult for [place] Planning/Counseling Not Complete  Palliative Care Screening Not Complete  Medication Review Press photographer) Complete  Some recent data might be hidden

## 2019-10-21 NOTE — Plan of Care (Signed)
  Problem: Pain Managment: Goal: General experience of comfort will improve Outcome: Progressing   Problem: Skin Integrity: Goal: Risk for impaired skin integrity will decrease Outcome: Progressing   

## 2019-10-21 NOTE — Progress Notes (Addendum)
Patient is comfort care. 

## 2019-10-21 NOTE — Progress Notes (Signed)
Nsg Discharge Note  Admit Date:  10/20/2019 Discharge date: 10/21/2019   Lisa Kemp to be D/C'd to Hospice per MD order.  AVS completed.  Copy for chart, and copy for patient signed, and dated. Handoff given to Hospice nurse, Mont Dutton, RN Discharge Medication: Allergies as of 10/21/2019      Reactions   Diazepam Other (See Comments)   Could not walk   Statins Other (See Comments)   CRAMPING      Medication List    STOP taking these medications   aspirin 81 MG tablet   atenolol 25 MG tablet Commonly known as: TENORMIN   atorvastatin 20 MG tablet Commonly known as: LIPITOR   Calcium 600-200 MG-UNIT tablet   docusate sodium 100 MG capsule Commonly known as: COLACE   ferrous sulfate 325 (65 FE) MG tablet   furosemide 40 MG tablet Commonly known as: LASIX   gabapentin 100 MG capsule Commonly known as: NEURONTIN   levothyroxine 175 MCG tablet Commonly known as: SYNTHROID   lisinopril 5 MG tablet Commonly known as: ZESTRIL   loperamide 2 MG tablet Commonly known as: IMODIUM A-D   LORazepam 1 MG tablet Commonly known as: ATIVAN   nitroGLYCERIN 0.4 MG SL tablet Commonly known as: NITROSTAT   omeprazole 40 MG capsule Commonly known as: PRILOSEC   ondansetron 4 MG tablet Commonly known as: ZOFRAN   potassium chloride 10 MEQ tablet Commonly known as: KLOR-CON   simethicone 80 MG chewable tablet Commonly known as: MYLICON   trolamine salicylate 10 % cream Commonly known as: ASPERCREME   Tussin DM 10-100 MG/5ML liquid Generic drug: dextromethorphan-guaiFENesin   vitamin B-12 500 MCG tablet Commonly known as: CYANOCOBALAMIN     TAKE these medications   acetaminophen 325 MG tablet Commonly known as: TYLENOL Take 2 tablets (650 mg total) by mouth every 6 (six) hours as needed for mild pain (or Fever >/= 101). What changed:   medication strength  how much to take  when to take this  reasons to take this   feeding supplement (ENSURE  ENLIVE) Liqd Take 237 mLs by mouth in the morning, at noon, and at bedtime.   glycopyrrolate 1 MG tablet Commonly known as: ROBINUL Take 1 tablet (1 mg total) by mouth every 4 (four) hours as needed (excessive secretions).   morphine CONCENTRATE 10 MG/0.5ML Soln concentrated solution Take 0.25 mLs (5 mg total) by mouth every 2 (two) hours as needed for moderate pain (or dyspnea).   OLANZapine zydis 5 MG disintegrating tablet Commonly known as: ZYPREXA Take 1 tablet (5 mg total) by mouth at bedtime.   ondansetron 4 MG disintegrating tablet Commonly known as: ZOFRAN-ODT Take 1 tablet (4 mg total) by mouth every 6 (six) hours as needed for nausea.       Discharge Assessment: Vitals:   10/20/19 1028 10/21/19 0500  BP:  120/86  Pulse: 92 (!) 101  Resp:  (!) 22  SpO2: 93% 90%   Skin clean, dry and intact without evidence of skin break down, no evidence of skin tears noted. IV catheter discontinued intact. Site without signs and symptoms of complications - no redness or edema noted at insertion site, patient denies c/o pain - only slight tenderness at site.  Dressing with slight pressure applied.  D/c Instructions-Education: Discharge instructions given to patient/family with verbalized understanding. D/c education completed with patient/family including follow up instructions, medication list, d/c activities limitations if indicated, with other d/c instructions as indicated by MD.   Patient escorted  via EMS to [place], RN 10/21/2019 10:28 AM

## 2019-10-26 DEATH — deceased

## 2019-10-27 NOTE — Discharge Summary (Signed)
Lisa Kemp, is a 86 y.o. adult  DOB 1923/10/11  MRN [id number].  Admission date:  10/15/2019  Admitting Physician  Lisa Roys, MD  Discharge Date:  10/20/19  Primary MD  Lisa Chroman, MD  Recommendations for primary care physician for things to follow:  1)Transfer to [place] with Full Comfort care  Admission Diagnosis  Brain lesion [G93.9] Acute CVA (cerebrovascular accident) [facility]) [I63.9] Cerebrovascular accident (CVA), unspecified mechanism ([place]) [I63.9]   Discharge Diagnosis  Brain lesion [G93.9] Acute CVA (cerebrovascular accident) ([place]) [I63.9] Cerebrovascular accident (CVA), unspecified mechanism ([place]) [I63.9]  *  Principal Problem:   Acute CVA (cerebrovascular accident)/Lt temporal lobe AND [place] Active Problems:   Acute metabolic encephalopathy   Dementia ([place])   Unspecified atrial fibrillation ([place])   Essential hypertension, benign   H/o SDH (subdural hematoma) -2015   Hypothyroidism      Past Medical History:  Diagnosis Date  . Arthritis   . CHF (congestive heart failure) ([place])   . Colon polyps   . Coronary atherosclerosis of native coronary artery    Nonobstructive at catherization 2006  . Dementia ([place])   . Esophageal ring    Distal esophageal ring status post dilation  . Gastrointestinal bleed    Related to NSAIDs  . GERD (gastroesophageal reflux disease)   . Hiatal hernia    Moderate sized sliding hiatal hernia  . Hypertension   . Hypothyroidism   . Intracranial hemorrhage ([place])    Left subcortical 2015  . Mixed hyperlipidemia    Statin intolerant  . Neuropathy     Past Surgical History:  Procedure Laterality Date  . APPENDECTOMY    . CATARACT EXTRACTION    . COLON SURGERY    . TOTAL ABDOMINAL HYSTERECTOMY    . TOTAL ABDOMINAL HYSTERECTOMY W/ BILATERAL SALPINGOOPHORECTOMY         HPI  from the history and physical done on the day  of admission:   -Lisa Kemp  is a 86 y.o. adult with medical history significant fordCHF, CAD, H/o prior SDH , HTN, dementia and chronic respiratory failure on 3 L at baseline admitted on 10/15/2019 from [facility] after a fall, found to have acute CVA-remains confused  - Talk to nursing home staff, at baseline, patient can answer simple questions and hold simple conversations, she recognizes staff, but ambulates with a wheelchair -Patient had increasing confusion for almost a week PTA  -Recurrent episodes of hypoglycemia due to very very poor oral intake -Requiring IV dextrose solutions repeatedly -Patient alternates from being very lethargic after sedation with medications to being very agitated and requiring sedatives --Patient sister requesting transition to comfort care, hospice consult requested    Hospital Course:     Brief Summary:- 86 y.o.femalewith medical history significant fordCHF, CAD, H/o prior SDH , HTN, dementia and chronic respiratory failure on 3 L at baseline admitted on 10/15/2019 from [facility] after a fall, found to have acute CVA-remains confused -Recurrent episodes of hypoglycemia due to very very poor oral  intake -Patient alternates from being very lethargic after sedation with medications to being very agitated and requiring sedatives --Patient sister requested transition to comfort care, hospice consult requested  A/p 1) acute CVA of left temporal lobe and insular --cranial imaging studies including CT head, MRI brain, MRA head noted -A1c 5.6,  -Echo with EF of 55 to 60% with grade 1 diastolic dysfunction -No regional wall motion abnormalities -PTA patient was on aspirin 81 mg daily, treated with Plavix, -History of statin intolerance, LDL 105, treated with Lipitor 20 mg daily -PT and speech therapy eval appreciated recommends SNF rehab -At this time --Patient sister requested transition to comfort care, and transfer to hospice house  when bed available  2) acute metabolic encephalopathy superimposed on underlying dementia--- most likely due to acute stroke as above #1, apparently recently treated for UTI with Augmentin, --Current UA does not show any signs of infection -At baseline apparently patient has difficulties with mobility related ADLs, gets around with wheelchair at ALF -She has been receiving Ativan as needed, but is either somnolent or agitated -We will start on Seroquel nightly and use Haldol as needed during the day. -If mental status does not improve with improved p.o. intake, may need to consider palliative care consult. -Acute stroke with severe metabolic encephalopathy superimposed on underlying dementia with recurrent episodes of hypoglycemia due to poor oral intake associated with lethargy and alternating severe agitation---overall prognosis is poor --transfer to hospice housewith Full comfort care orders  3)HTN-- hold lisinopril and Lasix   4) hypothyroidism--- treated with levothyroxine  TSH 0.2  5) status post fall--- may be secondary to #1 above, PT eval appreciated, no acute fractures on imaging studies, recommends SNF rehab--transfer to hospice housewith Full comfort care orders  6)Hyperkalemia--- most likely due to concomitant lisinopril use as well as potassium replacements PTA -Lisinopril and potassium has been discontinued,  7)New onset atrial fibrillation --- most likely paroxysmal A. Fib , potassium has normalized, echo with EF of 55 to 60% HASBLED Score = 4 CHA2DS2- VASc score is = 7 Which is equal to = 11.2% annual risk of stroke  --Discussed risk Versus benefit of FULL anticoagulation with patient's sister [name]-- [phone] sister declined for anticoagulation given increased's HASBLED score and history of falls Treated with aspirin and Plavix, transition to comfort care at this time  8) chronic anemia--- suspect partly nutritional, hemoglobin  currently 8.2, her baseline is around 10, no evidence of ongoing bleeding -transfer to hospice housewith Full comfort care orders   9) urinary retention. Noted by staff to have  retained urine on bladder scan.  Keep Foley in situ  10) acute respiratory failure with hypoxia. Patient requiring 3 to 4 L of oxygen. When oxygen is removed, she does drop down into the 80s. She is not coughing or febrile. She does not appear to be volume overloaded. Suspect that she may have hypoventilation and atelectasis. -transfer to hospice housewith Full comfort care orders   11)FEN--recurrent hypoglycemia-- -Recurrent episodes of hypoglycemia due to very very poor oral intake -Requiring IV dextrose solutions repeatedly -Patient alternates from being very lethargic after sedation with medications to being very agitated and requiring sedatives transfer to hospice housewith Full comfort care orders  Disposition---transfer to hospice housewith Full comfort care orders  Code Status:DNR  Family Communication: Discussed withMs. Lisa Kemp-- [phone] sister who is her POA -transfer to hospice housewith Full comfort care orders  Consults :Hospice   Discharge Condition: Overall prognosis is grave  Follow UP  Contact information for after-discharge care    [place] .   Service: Inpatient Hospice Contact information: [address] [place] [address] [phone]             Diet and Activity recommendation:  As advised  Discharge Instructions    Discharge Instructions    Call MD for:  difficulty breathing, headache or visual disturbances   Complete by: As directed    Call MD for:  persistant dizziness or light-headedness   Complete by: As directed    Call MD for:  persistant nausea and vomiting   Complete by: As directed    Call MD for:  severe uncontrolled pain   Complete by: As directed     Call MD for:  temperature >100.4   Complete by: As directed    Diet - low sodium heart healthy   Complete by: As directed    Discharge instructions   Complete by: As directed    1)You have new/acute stroke ----take Aspirin 81 mg daily along with Plavix 75 mg daily for 3 weeks, then after 3 weeks take only Plavix 75 mg daily without any further Aspirin therapy 2)Avoid ibuprofen/Advil/Aleve/Motrin/Goody Powders/Naproxen/BC powders/Meloxicam/Diclofenac/Indomethacin and other Nonsteroidal anti-inflammatory medications as these will make you more likely to bleed and can cause stomach ulcers, can also cause Kidney problems.  3)You are at risk for low blood sugar due to poor oral intake--- drink Ensure 1 can at least twice a day and take Remeron at bedtime for appetite stimulation 4)Physical Therapy as advised 5)Please check Accu-Cheks/fingerstick glucose 3 times a day   Increase activity slowly   Complete by: As directed        Discharge Medications     Allergies as of 10/20/2019      Reactions   Diazepam Other (See Comments)   Could not walk   Statins Other (See Comments)   CRAMPING      Medication List    STOP taking these medications   acetaminophen 500 MG tablet Commonly known as: TYLENOL   amoxicillin-clavulanate 500-125 MG tablet Commonly known as: AUGMENTIN   aspirin 81 MG tablet   atenolol 25 MG tablet Commonly known as: TENORMIN   furosemide 40 MG tablet Commonly known as: Lasix   lisinopril 5 MG tablet Commonly known as: ZESTRIL   LORazepam 1 MG tablet Commonly known as: ATIVAN       Major procedures and Radiology Reports - PLEASE review detailed and final reports for all details, in brief -   DG Chest 1 View  Result Date: 10/15/2019 CLINICAL DATA:  85 year old female with fall.  Shortness of breath. EXAM: CHEST  1 VIEW COMPARISON:  Chest radiograph dated 03/04/2019. FINDINGS: A crescentic airspace opacity at the medial right lung base, likely atelectasis  secondary to a moderate-sized hiatal hernia. The lungs are otherwise clear. There is no pleural effusion or pneumothorax. Mild cardiomegaly. Atherosclerotic calcification of the aorta. No acute osseous pathology. Osteopenia. IMPRESSION: Probable area of atelectasis in the right infrahilar region secondary to a moderate size hiatal hernia. No definite acute cardiopulmonary process. Electronically Signed   By: Anner Crete M.D.   On: 10/15/2019 18:01   DG Pelvis 1-2 Views  Result Date: 10/15/2019 CLINICAL DATA:  86 year old female with fall. EXAM: PELVIS - 1-2 VIEW COMPARISON:  None. FINDINGS: There is no acute fracture or dislocation. The bones are osteopenic. There is degenerative changes of the visualized lower lumbar spine. Multiple surgical clips noted over  the pelvis. The soft tissues are unremarkable. Small radiopaque foci over the lower abdomen bilaterally may represent fecalith versus renal calculi. Vascular calcifications noted. IMPRESSION: No acute fracture or dislocation. Electronically Signed   By: Anner Crete M.D.   On: 10/15/2019 18:03   DG Tibia/Fibula Right  Result Date: 10/15/2019 CLINICAL DATA:  86 year old female status post fall with right lower leg skin tear. EXAM: RIGHT TIBIA AND FIBULA - 2 VIEW COMPARISON:  None. FINDINGS: Calcified peripheral vascular disease. Generalized soft tissue stranding. No soft tissue gas identified. No radiopaque foreign body identified. No acute osseous abnormality identified. Severe lateral compartment joint degeneration at the right knee. IMPRESSION: 1. No acute osseous abnormality identified about the right tib-fib. Severe lateral compartment right knee joint degeneration. 2. Calcified peripheral vascular disease. Generalized soft tissue stranding. Electronically Signed   By: Genevie Ann M.D.   On: 10/15/2019 18:03   CT Head Wo Contrast  Result Date: 10/15/2019 CLINICAL DATA:  Head trauma, headache. Additional history provided: Unwitnessed fall,  increased confusion this week, recent diagnosis of UTI, history of hypertension, dementia, intracranial hemorrhage EXAM: CT HEAD WITHOUT CONTRAST TECHNIQUE: Contiguous axial images were obtained from the base of the skull through the vertex without intravenous contrast. COMPARISON:  Head CT 03/22/2015. FINDINGS: Brain: There is no evidence of acute intracranial hemorrhage. No midline shift or extra-axial fluid collection. There is a region of abnormal hypodensity within the left temporal lobe measuring 6.2 x 2.0 x 1.8 cm (AP x TV x CC) (series 2, image 15) (series 4, image 48). This abnormal hypodensity appears to predominantly affect the white matter. Mild for age ill-defined hypoattenuation within the remainder of the cerebral white matter is nonspecific, but consistent with chronic small vessel ischemic disease. Mild generalized parenchymal atrophy. Redemonstrated nonspecific irregular dural-based calcifications along right frontal convexity. Vascular: No hyperdense vessel. Atherosclerotic calcifications. Skull: No calvarial fracture or aggressive osseous lesion. Sinuses/Orbits: Visualized orbits demonstrate no acute abnormality. No significant paranasal sinus disease or mastoid effusion. Other: Right parietal scalp soft tissue swelling These results were called by telephone at the time of interpretation on 10/15/2019 at 5:51 pm to provider [facility] , who verbally acknowledged these results. IMPRESSION: 6.2 cm region of abnormal hypodensity within the left temporal lobe predominantly affecting the white matter. This finding is new from CT 03/22/2015 and indeterminate. Primary differential considerations include edema related to a primary or metastatic neoplasm versus an infectious/inflammatory process. Recent ischemic infarction is also a consideration, although this would be an atypical appearance. Contrast-enhanced brain MRI is recommended for further evaluation. Background mild generalized parenchymal  atrophy and chronic small vessel ischemic disease. No evidence of acute intracranial hemorrhage. Right parietal scalp soft tissue swelling. Electronically Signed   By: Kellie Simmering DO   On: 10/15/2019 17:51   MR ANGIO HEAD WO CONTRAST  Result Date: 10/15/2019 CLINICAL DATA:  [place].  Left temporal abnormality. EXAM: MRA HEAD WITHOUT CONTRAST TECHNIQUE: Angiographic images of the Circle of Willis were obtained using MRA technique without intravenous contrast. COMPARISON:  Brain study same day FINDINGS: The study is limited by motion degradation. Both internal carotid arteries are patent through the skull base and siphon regions. There is flow in the anterior and middle cerebral arteries. Distal vessels cannot be evaluated. Both vertebral arteries are patent to the basilar. The basilar artery shows flow. Both superior cerebellar arteries and posterior cerebral arteries show flow. IMPRESSION: Limited and motion degraded exam. Flow within the major vessels at the base of the brain. Distal vessel information  not reliable because of pronounced motion. Electronically Signed   By: Nelson Chimes M.D.   On: 10/15/2019 19:20   MR BRAIN WO CONTRAST  Result Date: 10/15/2019 CLINICAL DATA:  Encephalopathy. Unwitnessed fall with trauma to the head. EXAM: MRI HEAD WITHOUT CONTRAST TECHNIQUE: Multiplanar, multiecho pulse sequences of the brain and surrounding structures were obtained without intravenous contrast. COMPARISON:  Head CT same day.  Head CT 03/22/2015. FINDINGS: MRI HEAD FINDINGS The study is limited and abbreviated by patient motion. Brain: There is acute/subacute infarction affecting the left temporal lobe, with minor involvement of the insular brain. The study is markedly degraded by motion, but I do not have significant suspicion regarding tumor or cerebritis. No gross hemorrhage. No significant mass effect or shift. Elsewhere, there chronic small-vessel ischemic changes of the white matter. Vascular: No  vascular information available. Skull and upper cervical spine: No abnormality seen. Sinuses/Orbits: Negative Other: None IMPRESSION: Acute to early subacute infarction affecting the left temporal lobe, with minor involvement of the adjacent insular brain. The study is limited and abbreviated by patient motion. I do not have any significant suspicion regarding tumor or cerebritis. Electronically Signed   By: Nelson Chimes M.D.   On: 10/15/2019 19:17   US Carotid Bilateral  Result Date: 10/17/2019 Griffin Basil, RT     10/17/2019 12:31 PM Patient refused Carotid Ultrasound . vmm  DG CHEST PORT 1 VIEW  Result Date: 10/18/2019 CLINICAL DATA:  Respiratory failure, history of CHF EXAM: PORTABLE CHEST 1 VIEW COMPARISON:  10/15/2019 FINDINGS: Single frontal view of the chest demonstrates a stable cardiac silhouette. No airspace disease, effusion, or pneumothorax. The cardiac silhouette is stable. Hiatal hernia unchanged. No acute bony abnormalities. IMPRESSION: 1. Stable hiatal hernia.  No acute airspace disease. Electronically Signed   By: Randa Ngo M.D.   On: 10/18/2019 19:40   ECHOCARDIOGRAM COMPLETE  Result Date: 10/16/2019    ECHOCARDIOGRAM REPORT   Patient Name:   Lisa Kemp Date of Exam: 10/16/2019 Medical Rec #:  [id number]           Height:       65.0 in Accession #:    [id number]          Weight:       163.1 lb Date of Birth:  1923-10-28           BSA:          1.81 m Patient Age:    42 years            BP:           119/55 mmHg Patient Gender: F                   HR:           82 bpm. Exam Location:  [facility] Procedure: 2D Echo Indications:    Abnormal ECG 794.31 / R94.31  History:        Patient has prior history of Echocardiogram examinations, most                 recent 03/05/2019. CHF, CAD, Stroke, Arrythmias:Atrial                 Fibrillation; Risk Factors:Hypertension, Non-Smoker and                 Dyslipidemia. GERD, Elevated troponin.  Sonographer:    Leavy Cella RDCS (AE)  Referring Phys: [place]  1. Left ventricular ejection  fraction, by estimation, is 55 to 60%. The left ventricle has normal function. The left ventricle has no regional wall motion abnormalities. There is mild left ventricular hypertrophy. Left ventricular diastolic parameters are consistent with Grade I diastolic dysfunction (impaired relaxation).  2. Right ventricular systolic function is normal. The right ventricular size is normal. There is moderately elevated pulmonary artery systolic pressure. The estimated right ventricular systolic pressure is Q000111Q mmHg.  3. Left atrial size was mildly dilated.  4. The mitral valve is grossly normal. Mild mitral valve regurgitation.  5. Tricuspid valve regurgitation is mild to moderate.  6. The aortic valve is tricuspid. Aortic valve regurgitation is trivial.  7. The inferior vena cava is normal in size with greater than 50% respiratory variability, suggesting right atrial pressure of 3 mmHg. FINDINGS  Left Ventricle: Left ventricular ejection fraction, by estimation, is 55 to 60%. The left ventricle has normal function. The left ventricle has no regional wall motion abnormalities. The left ventricular internal cavity size was normal in size. There is  mild left ventricular hypertrophy. Left ventricular diastolic parameters are consistent with Grade I diastolic dysfunction (impaired relaxation). Right Ventricle: The right ventricular size is normal. No increase in right ventricular wall thickness. Right ventricular systolic function is normal. There is moderately elevated pulmonary artery systolic pressure. The tricuspid regurgitant velocity is 3.41 m/s, and with an assumed right atrial pressure of 3 mmHg, the estimated right ventricular systolic pressure is Q000111Q mmHg. Left Atrium: Left atrial size was mildly dilated. Right Atrium: Right atrial size was normal in size. Pericardium: There is no evidence of pericardial effusion. Mitral Valve: The  mitral valve is grossly normal. Mild mitral valve regurgitation. Tricuspid Valve: The tricuspid valve is grossly normal. Tricuspid valve regurgitation is mild to moderate. Aortic Valve: The aortic valve is tricuspid. Aortic valve regurgitation is trivial. Aortic regurgitation PHT measures 297 msec. Moderate aortic valve annular calcification. There is mild calcification of the aortic valve. Pulmonic Valve: The pulmonic valve was grossly normal. Pulmonic valve regurgitation is not visualized. Aorta: The aortic root is normal in size and structure. Venous: The inferior vena cava is normal in size with greater than 50% respiratory variability, suggesting right atrial pressure of 3 mmHg. IAS/Shunts: No atrial level shunt detected by color flow Doppler.  LEFT VENTRICLE PLAX 2D LVIDd:         4.11 cm  Diastology LVIDs:         3.13 cm  LV e' lateral:   5.87 cm/s LV PW:         1.10 cm  LV E/e' lateral: 19.4 LV IVS:        1.33 cm  LV e' medial:    10.10 cm/s LVOT diam:     2.00 cm  LV E/e' medial:  11.3 LV SV Index:   19.27 LVOT Area:     3.14 cm  RIGHT VENTRICLE RV S prime:     8.81 cm/s TAPSE (M-mode): 2.6 cm LEFT ATRIUM             Index       RIGHT ATRIUM           Index LA diam:        3.70 cm 2.04 cm/m  RA Area:     14.20 cm LA Vol (A2C):   82.8 ml 45.64 ml/m RA Volume:   34.10 ml  18.80 ml/m LA Vol (A4C):   54.0 ml 29.77 ml/m LA Biplane Vol: 68.9 ml 37.98 ml/m  AORTIC  VALVE AI PHT:      297 msec  AORTA Ao Root diam: 3.00 cm MITRAL VALVE                TRICUSPID VALVE MV Area (PHT): 5.13 cm     TR Peak grad:   46.5 mmHg MV Decel Time: 148 msec     TR Vmax:        341.00 cm/s MR Peak grad: 130.0 mmHg MR Mean grad: 95.0 mmHg     SHUNTS MR Vmax:      570.00 cm/s   Systemic Diam: 2.00 cm MR Vmean:     463.0 cm/s MV E velocity: 114.00 cm/s MV A velocity: 117.00 cm/s MV E/A ratio:  0.97 Rozann Lesches MD Electronically signed by Rozann Lesches MD Signature Date/Time: 10/16/2019/11:40:24 AM    Final     Micro  Results   No results found for this or any previous visit (from the past 240 hour(s)).  Today   Subjective    Lisa Kemp today has no new concerns -transfer to hospice housewith Full comfort care orders       Patient has been seen and examined prior to discharge   Objective   Blood pressure (!) 114/58, pulse 97, temperature 98.5 F (36.9 C), temperature source Oral, resp. rate (!) 21, height 5\' 5"  (1.651 m), weight 74 kg, SpO2 91 %.  No intake or output data in the 24 hours ending 10/27/19 1805  Exam General exam:Somnolent, with episodes of agitation from time to time requiring medication  respiratory system: Clear to auscultation. Respiratory effort normal. Cardiovascular system:RRR. No murmurs, rubs, gallops. Gastrointestinal system:Abdomen is nondistended, soft and nontender. . Normal bowel sounds heard. Central nervous system:No New focal neurological deficits. Extremities: No C/C/E, +pedal pulses Skin: No rashes, lesions or ulcers Psychiatry:Somnolent.   Data Review   CBC w Diff:  Lab Results  Component Value Date   WBC 5.2 10/18/2019   HGB 8.2 (L) 10/18/2019   HCT 26.5 (L) 10/18/2019   PLT 152 10/18/2019   LYMPHOPCT 19 10/15/2019   MONOPCT 9 10/15/2019   EOSPCT 3 10/15/2019   BASOPCT 1 10/15/2019    CMP:  Lab Results  Component Value Date   NA 141 10/19/2019   K 4.4 10/19/2019   CL 96 (L) 10/19/2019   CO2 31 10/19/2019   BUN 42 (H) 10/19/2019   CREATININE 1.27 (H) 10/19/2019   PROT 6.6 03/04/2019   ALBUMIN 3.7 03/04/2019   BILITOT 0.3 03/04/2019   ALKPHOS 44 03/04/2019   AST 21 03/04/2019   ALT 17 03/04/2019  . Total Discharge time is about 33 minutes  Roxan Hockey M.D on 10/27/2019 at 6:05 PM  Go to [email or web address] -  for contact info  [facility] - Office  [phone]

## 2020-03-19 IMAGING — DX DG PELVIS 1-2V
1 series · 1 of 1 positions shown · non-contrast
Comparison: None.

CLINICAL DATA: [AGE] female with fall.

EXAM:
PELVIS - 1-2 VIEW

[pelvis ap]
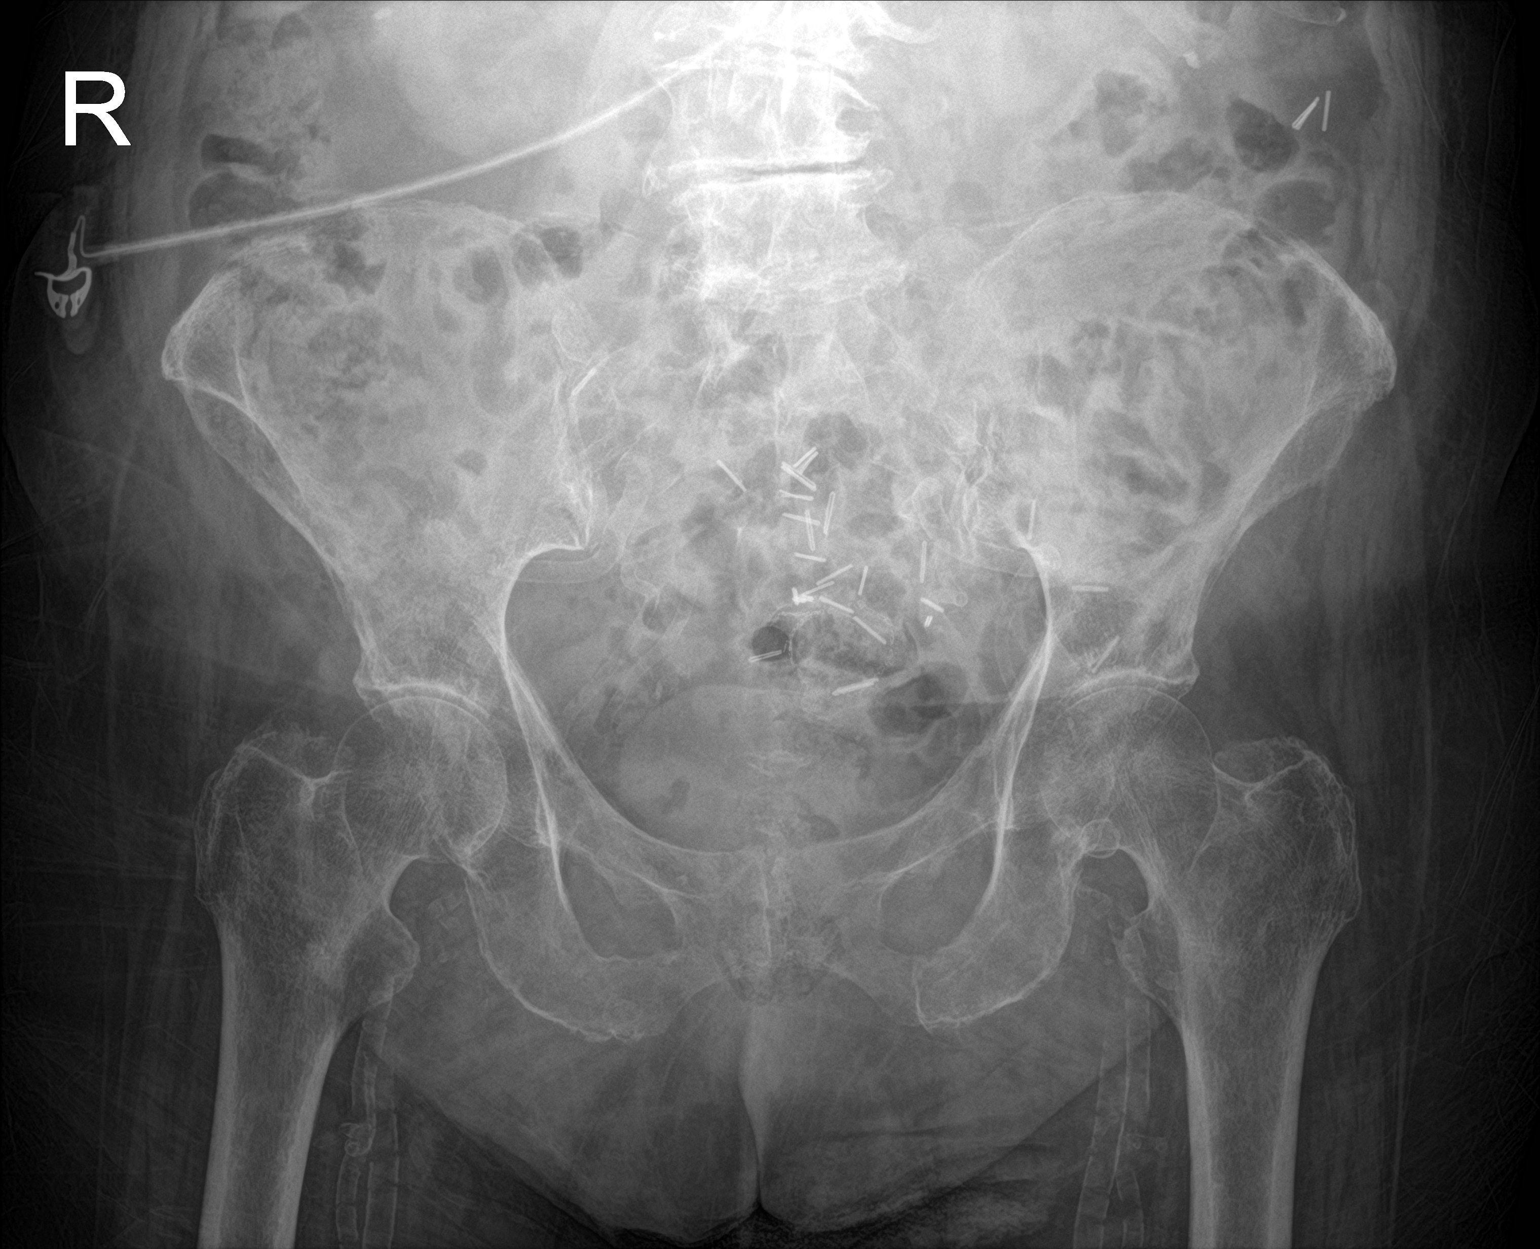

[1 of 1 positions shown; findings below may reference images not displayed]

FINDINGS: There is no acute fracture or dislocation. The bones are osteopenic.
There is degenerative changes of the visualized lower lumbar spine.
Multiple surgical clips noted over the pelvis. The soft tissues are
unremarkable. Small radiopaque foci over the lower abdomen
bilaterally may represent fecalith versus renal calculi. Vascular
calcifications noted.
IMPRESSION: No acute fracture or dislocation.

## 2020-03-19 IMAGING — DX DG TIBIA/FIBULA 2V*R*
2 series · 2 of 2 positions shown · non-contrast
Comparison: None.

CLINICAL DATA: [AGE] female status post fall with right lower
leg skin tear.

EXAM:
RIGHT TIBIA AND FIBULA - 2 VIEW

[tibia ap]
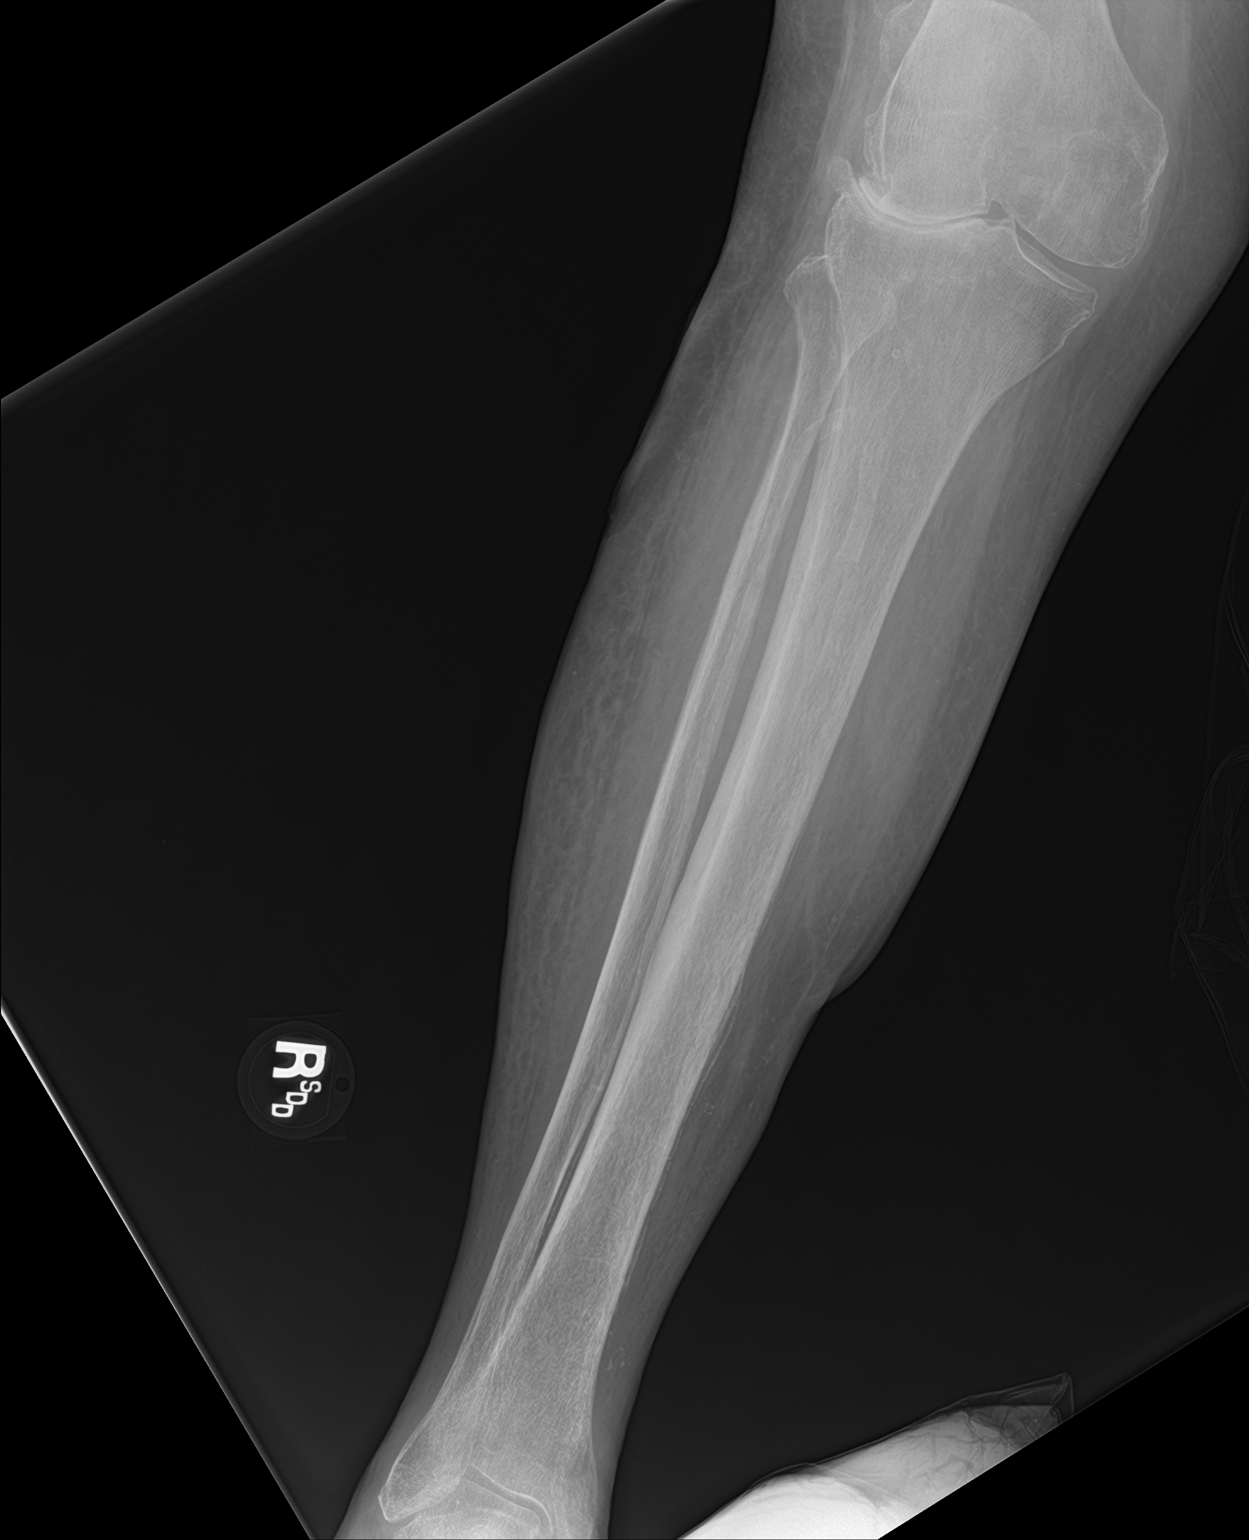

[tibia lat]
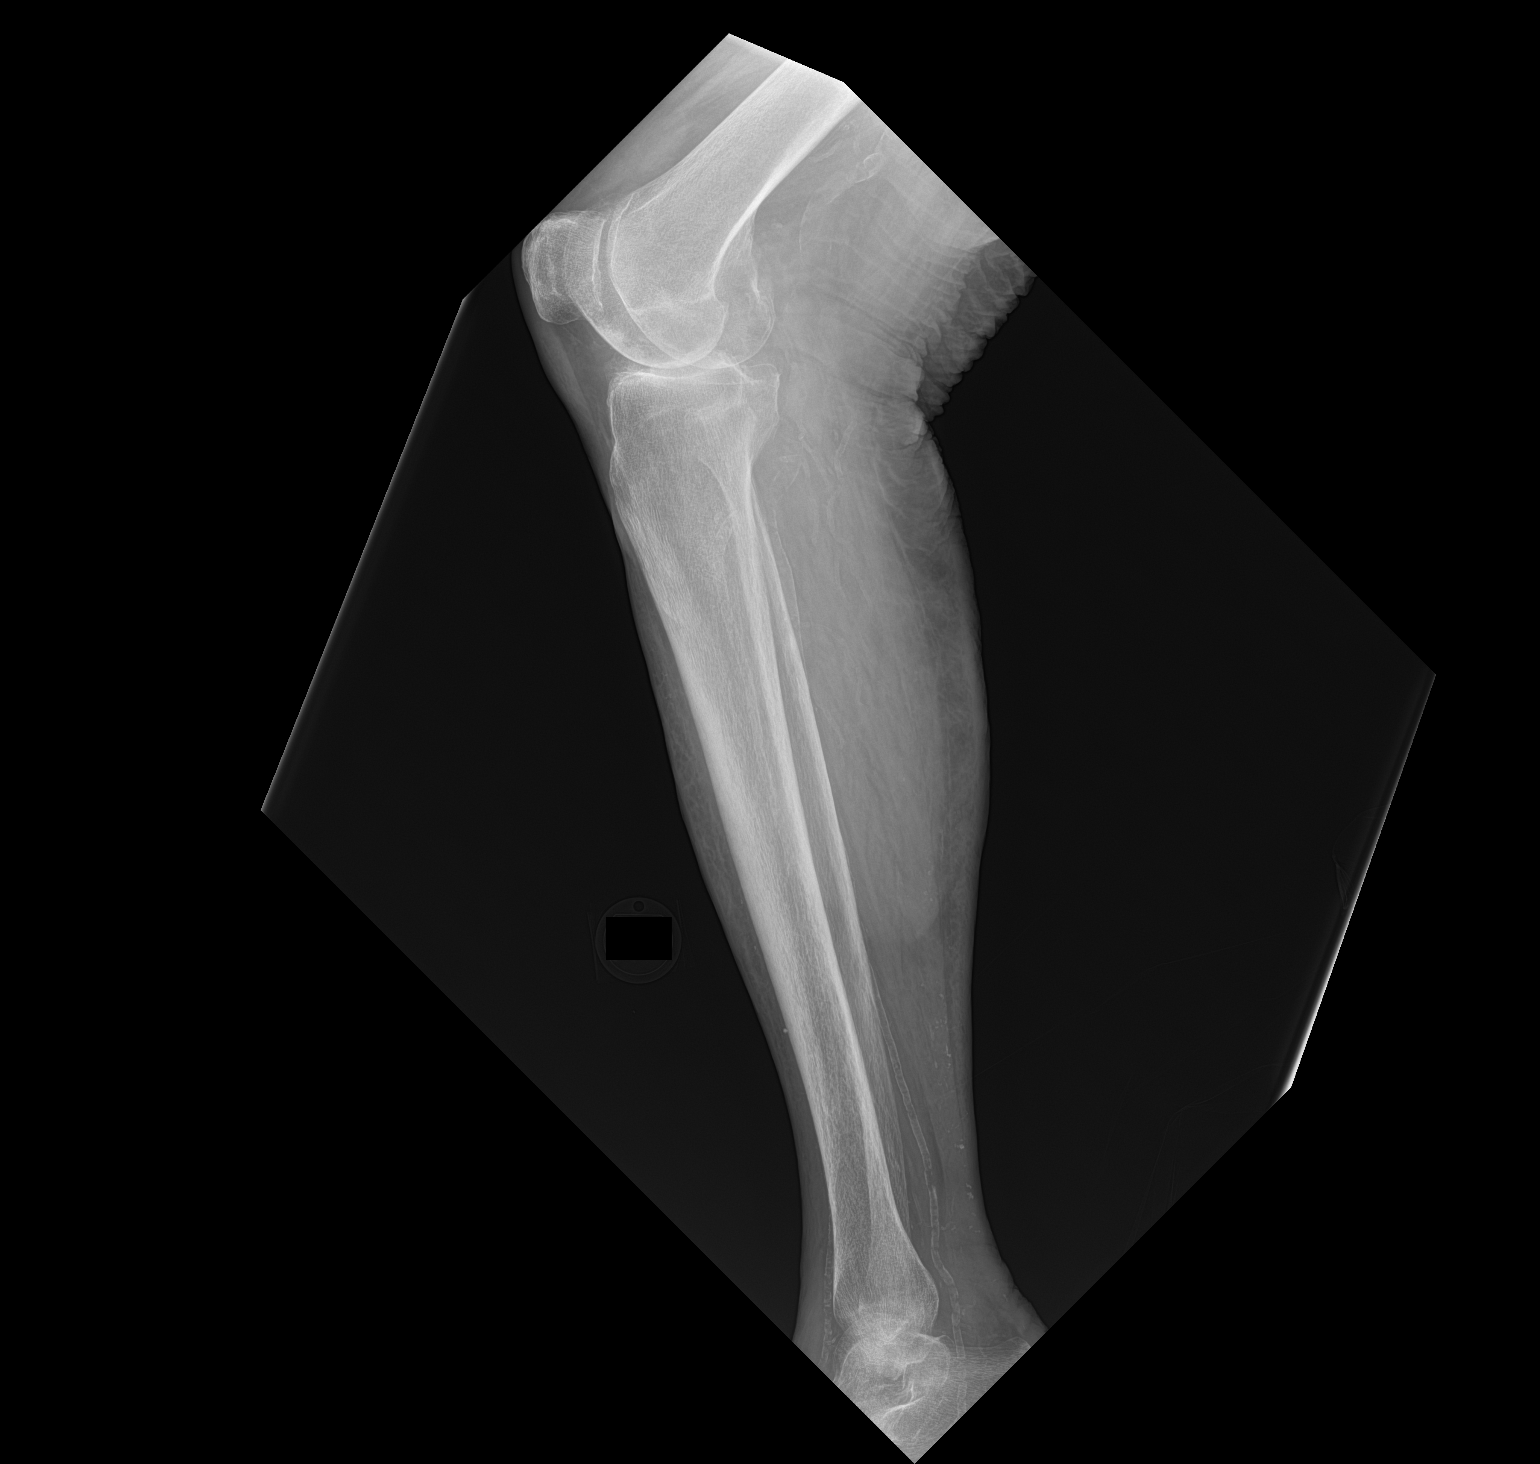

[2 of 2 positions shown; findings below may reference images not displayed]

FINDINGS: Calcified peripheral vascular disease. Generalized soft tissue
stranding. No soft tissue gas identified. No radiopaque foreign body
identified. No acute osseous abnormality identified. Severe lateral
compartment joint degeneration at the right knee.
IMPRESSION: 1. No acute osseous abnormality identified about the right tib-fib.
Severe lateral compartment right knee joint degeneration.
2. Calcified peripheral vascular disease. Generalized soft tissue
stranding.
